# Patient Record
Sex: Female | Born: 1985 | Race: Black or African American | Hispanic: No | Marital: Single | Smoking: Current every day smoker
Health system: Southern US, Community
[De-identification: ages and names within clinical notes are randomized; demographics above are authoritative.]

## PROBLEM LIST (undated history)

## (undated) ENCOUNTER — Inpatient Hospital Stay (HOSPITAL_COMMUNITY): Payer: Self-pay

## (undated) DIAGNOSIS — I10 Essential (primary) hypertension: Secondary | ICD-10-CM

## (undated) DIAGNOSIS — R87619 Unspecified abnormal cytological findings in specimens from cervix uteri: Secondary | ICD-10-CM

## (undated) DIAGNOSIS — IMO0002 Reserved for concepts with insufficient information to code with codable children: Secondary | ICD-10-CM

## (undated) DIAGNOSIS — Z8759 Personal history of other complications of pregnancy, childbirth and the puerperium: Secondary | ICD-10-CM

## (undated) DIAGNOSIS — J45909 Unspecified asthma, uncomplicated: Secondary | ICD-10-CM

## (undated) DIAGNOSIS — R0602 Shortness of breath: Secondary | ICD-10-CM

## (undated) HISTORY — DX: Essential (primary) hypertension: I10

## (undated) HISTORY — PX: APPENDECTOMY: SHX54

## (undated) HISTORY — DX: Personal history of other complications of pregnancy, childbirth and the puerperium: Z87.59

---

## 2006-10-13 ENCOUNTER — Emergency Department (HOSPITAL_COMMUNITY): Admission: EM | Admit: 2006-10-13 | Discharge: 2006-10-13 | Payer: Self-pay | Admitting: Emergency Medicine

## 2006-11-26 ENCOUNTER — Emergency Department (HOSPITAL_COMMUNITY): Admission: EM | Admit: 2006-11-26 | Discharge: 2006-11-26 | Payer: Self-pay | Admitting: Emergency Medicine

## 2007-06-11 ENCOUNTER — Emergency Department (HOSPITAL_COMMUNITY): Admission: EM | Admit: 2007-06-11 | Discharge: 2007-06-11 | Payer: Self-pay | Admitting: Family Medicine

## 2007-07-12 ENCOUNTER — Emergency Department (HOSPITAL_COMMUNITY): Admission: EM | Admit: 2007-07-12 | Discharge: 2007-07-12 | Payer: Self-pay | Admitting: Emergency Medicine

## 2009-02-17 ENCOUNTER — Emergency Department (HOSPITAL_COMMUNITY): Admission: EM | Admit: 2009-02-17 | Discharge: 2009-02-17 | Payer: Self-pay | Admitting: Family Medicine

## 2009-10-24 ENCOUNTER — Emergency Department (HOSPITAL_COMMUNITY): Admission: EM | Admit: 2009-10-24 | Discharge: 2009-10-24 | Payer: Self-pay | Admitting: Emergency Medicine

## 2010-06-13 ENCOUNTER — Inpatient Hospital Stay (INDEPENDENT_AMBULATORY_CARE_PROVIDER_SITE_OTHER)
Admission: RE | Admit: 2010-06-13 | Discharge: 2010-06-13 | Disposition: A | Discharge status: Home / Self Care | Payer: BC Managed Care – PPO | Source: Ambulatory Visit | Attending: Family Medicine | Admitting: Family Medicine

## 2010-06-13 DIAGNOSIS — B9789 Other viral agents as the cause of diseases classified elsewhere: Secondary | ICD-10-CM

## 2010-06-13 LAB — POCT RAPID STREP A (OFFICE): Streptococcus, Group A Screen (Direct): NEGATIVE

## 2010-11-13 LAB — DIFFERENTIAL
Basophils Absolute: 0
Basophils Relative: 0
Eosinophils Absolute: 0
Eosinophils Relative: 0
Lymphocytes Relative: 13
Lymphs Abs: 1.2
Monocytes Absolute: 0.4
Monocytes Relative: 5
Neutro Abs: 7.4
Neutrophils Relative %: 82 — ABNORMAL HIGH

## 2010-11-13 LAB — CBC
HCT: 37.5
Hemoglobin: 12.8
MCHC: 34.1
MCV: 88.7
Platelets: 285
RBC: 4.23
RDW: 13.5
WBC: 9

## 2011-03-22 ENCOUNTER — Encounter (HOSPITAL_COMMUNITY): Payer: Self-pay

## 2011-03-22 ENCOUNTER — Emergency Department (HOSPITAL_COMMUNITY)
Admission: EM | Admit: 2011-03-22 | Discharge: 2011-03-22 | Disposition: A | Discharge status: Home / Self Care | Payer: BC Managed Care – PPO | Source: Home / Self Care | Attending: Family Medicine | Admitting: Family Medicine

## 2011-03-22 DIAGNOSIS — G44209 Tension-type headache, unspecified, not intractable: Secondary | ICD-10-CM

## 2011-03-22 NOTE — Discharge Instructions (Signed)
This is likely a tension headache. There are many causes for tension headaches. These range from holding the head in a certain position for prolonged periods of time to stress. This is usually treated with anti-inflammatory medications, such as naproxen or ibuprofen. Recommend taking 1-2 naproxen tablets every 12 hours for the next 7-10 days. A commercial brand of this product is also known as Aleve. you may also try mild heat to the area. And stretching exercises as well. Return to care should your symptoms not improve or worsen in any way such as numbness, tingling, or weakness or changes in vision.

## 2011-03-22 NOTE — ED Provider Notes (Signed)
History     CSN: [id number]  Arrival date & time 03/22/11  1657   First MD Initiated Contact with Patient 03/22/11 1820      Chief Complaint  Patient presents with  . 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 She denies any previous hx of headaches or migraines. Patient is a 26 y.o. female presenting with headaches. The history is provided by the patient.  Headache The primary symptoms include headaches. Primary symptoms do not include syncope, dizziness, focal weakness, fever, nausea or vomiting. The symptoms began more than 1 week ago. The symptoms are unchanged.  The headache began more than 2 days ago. Headache is a new problem. The headache is present intermittently. Location/region(s) of the headache: frontal. The headache is associated with nothing. The headache is associated with neck stiffness. The headache is not associated with weakness.  Additional symptoms include neck stiffness. Additional symptoms do not include weakness.    History reviewed. No pertinent past medical history.  Past Surgical History  Procedure Date  . Appendectomy     History reviewed. No pertinent family history.  History  Substance Use Topics  . Smoking status: Never Smoker   . Smokeless tobacco: Not on file  . Alcohol Use: No    OB History    Grav Para Term Preterm Abortions TAB SAB Ect Mult Living                  Review of Systems  Constitutional: Negative.  Negative for fever.  HENT: Positive for neck pain and neck stiffness.   Eyes: Negative.   Respiratory: Negative.   Cardiovascular: Negative.  Negative for syncope.  Gastrointestinal: Negative.   Negative for nausea and vomiting.  Genitourinary: Negative.   Skin: Negative.   Neurological: Positive for headaches. Negative for dizziness, focal weakness, weakness, light-headedness and numbness.    Allergies  Review of patient's allergies indicates no known allergies.  Home Medications   Current Outpatient Rx  Name Route Sig Dispense Refill  . OMEGA-3 FATTY ACIDS 1000 MG PO CAPS Oral Take 2 g by mouth daily.    . ORLISTAT 60 MG PO CAPS Oral Take 60 mg by mouth 3 (three) times daily with meals.      BP 126/75  Pulse 65  Temp(Src) 98.9 F (37.2 C) (Oral)  Resp 18  SpO2 100%  LMP 03/17/2011  Physical Exam  Nursing note and vitals reviewed. Constitutional: She is oriented to person, place, and time. She appears well-developed and well-nourished.  HENT:  Head: Normocephalic and atraumatic.  Eyes: EOM are normal.  Neck: Normal range of motion and full passive range of motion without pain. Muscular tenderness present. No spinous process tenderness present. No rigidity. Normal range of motion present.  Pulmonary/Chest: Effort normal.  Musculoskeletal: Normal range of motion.  Neurological: She is alert and oriented to person, place, and time.  Skin: Skin is warm and dry.  Psychiatric: Her behavior is normal.    ED Course  Procedures (including critical care time)  Labs Reviewed - No data to display No results found.   1. Tension headache       MDM  Advised over the counter NSAIDs such as naproxen or ibuprofen, with mild heat; return precautions given  such as numbness, tingling, or weakness        Richardo Priest, MD 03/22/11 2018

## 2011-03-22 NOTE — ED Notes (Signed)
Pt has had headache off and on for three weeks, states it starts on rt side of head and back of neck.

## 2012-06-10 ENCOUNTER — Inpatient Hospital Stay (HOSPITAL_COMMUNITY)
Admission: AD | Admit: 2012-06-10 | Discharge: 2012-06-10 | Disposition: A | Discharge status: Home / Self Care | Payer: Medicaid Other | Source: Ambulatory Visit | Attending: Obstetrics & Gynecology | Admitting: Obstetrics & Gynecology

## 2012-06-10 ENCOUNTER — Encounter (HOSPITAL_COMMUNITY): Payer: Self-pay | Admitting: *Deleted

## 2012-06-10 DIAGNOSIS — O99891 Other specified diseases and conditions complicating pregnancy: Secondary | ICD-10-CM | POA: Insufficient documentation

## 2012-06-10 DIAGNOSIS — Z3201 Encounter for pregnancy test, result positive: Secondary | ICD-10-CM

## 2012-06-10 DIAGNOSIS — Z3490 Encounter for supervision of normal pregnancy, unspecified, unspecified trimester: Secondary | ICD-10-CM

## 2012-06-10 LAB — POCT PREGNANCY, URINE: Preg Test, Ur: POSITIVE — AB

## 2012-06-10 LAB — HCG, QUANTITATIVE, PREGNANCY: hCG, Beta Chain, Quant, S: 5218 m[IU]/mL — ABNORMAL HIGH (ref <5)

## 2012-06-10 NOTE — MAU Provider Note (Signed)
Attestation of Attending Supervision of Advanced Practitioner: Evaluation and management procedures were performed by the PA/NP/CNM/OB Fellow under my supervision/collaboration. Chart reviewed and agree with management and plan.  Nobie Alleyne V 06/10/2012 10:15 PM

## 2012-06-10 NOTE — MAU Provider Note (Signed)
None     Chief Complaint:  Seen at pregnancy care center this afternoon, and nurse performed abdominal u/s and could not see the uterus.  (they do not have a vaginal probe).  No pain/bleeding. Pt cannot stay any longer.    [name] is  27 y.o. No obstetric history on file.  Obstetrical/Gynecological History: Menstrual History: OB History   Grav Para Term Preterm Abortions TAB SAB Ect Mult Living                  Patient's last menstrual period was 04/16/2012.     Past Medical History: No past medical history on file.  Past Surgical History: Past Surgical History  Procedure Laterality Date  . Appendectomy      Family History: No family history on file.  Social History: History  Substance Use Topics  . Smoking status: Never Smoker   . Smokeless tobacco: Not on file  . Alcohol Use: No    Allergies: No Known Allergies  Meds:  Prescriptions prior to admission  Medication Sig Dispense Refill  . fish oil-omega-3 fatty acids 1000 MG capsule Take 2 g by mouth daily.      Marland Kitchen orlistat (ALLI) 60 MG capsule Take 60 mg by mouth 3 (three) times daily with meals.        Review of Systems   Constitutional: Negative for fever and chills Eyes: Negative for visual disturbances Respiratory: Negative for shortness of breath, dyspnea Cardiovascular: Negative for chest pain or palpitations  Gastrointestinal: Negative for vomiting, diarrhea and constipation Genitourinary: Negative for dysuria and urgency Musculoskeletal: Negative for back pain, joint pain, myalgias  Neurological: Negative for dizziness and headaches    .   Labs: UPT barely + Imaging Studies:  Assessment: Victoria Robinson is  27 y.o. with probable early pregnancy.  After her Qhcg was drawn, pt had to leave.    Plan Pt will call back for Qhcg results and agrees to come back to MAU if any pain/bleeding occurs.  Pt already has plans to start [facility] with her  OB/GYN  CRESENZO-DISHMAN,Devonda Pequignot 5/8/20146:23 PM

## 2012-06-10 NOTE — MAU Note (Signed)
SPOKE WITH FRAN, CNM- SAID DREW LABS AND  TALKED TO PT AND D/C PT.

## 2012-06-10 NOTE — MAU Note (Signed)
Went to the pregnancy center for an [place], she couldn't see the baby- so she recommended that she come over here.

## 2012-06-11 ENCOUNTER — Ambulatory Visit (HOSPITAL_COMMUNITY): Payer: Self-pay

## 2012-06-11 ENCOUNTER — Other Ambulatory Visit: Payer: Self-pay | Admitting: Advanced Practice Midwife

## 2012-06-11 DIAGNOSIS — Z3490 Encounter for supervision of normal pregnancy, unspecified, unspecified trimester: Secondary | ICD-10-CM

## 2012-06-22 ENCOUNTER — Ambulatory Visit (HOSPITAL_COMMUNITY): Admission: RE | Admit: 2012-06-22 | Payer: Self-pay | Source: Ambulatory Visit

## 2012-08-18 ENCOUNTER — Encounter (HOSPITAL_COMMUNITY): Payer: Self-pay | Admitting: Emergency Medicine

## 2012-08-18 ENCOUNTER — Emergency Department (HOSPITAL_COMMUNITY)
Admission: EM | Admit: 2012-08-18 | Discharge: 2012-08-18 | Disposition: A | Discharge status: Home / Self Care | Payer: Medicaid Other | Attending: Emergency Medicine | Admitting: Emergency Medicine

## 2012-08-18 DIAGNOSIS — Y9389 Activity, other specified: Secondary | ICD-10-CM | POA: Insufficient documentation

## 2012-08-18 DIAGNOSIS — O9989 Other specified diseases and conditions complicating pregnancy, childbirth and the puerperium: Secondary | ICD-10-CM | POA: Insufficient documentation

## 2012-08-18 DIAGNOSIS — I10 Essential (primary) hypertension: Secondary | ICD-10-CM | POA: Insufficient documentation

## 2012-08-18 DIAGNOSIS — S060X0A Concussion without loss of consciousness, initial encounter: Secondary | ICD-10-CM | POA: Insufficient documentation

## 2012-08-18 DIAGNOSIS — F411 Generalized anxiety disorder: Secondary | ICD-10-CM | POA: Insufficient documentation

## 2012-08-18 DIAGNOSIS — W1809XA Striking against other object with subsequent fall, initial encounter: Secondary | ICD-10-CM | POA: Insufficient documentation

## 2012-08-18 DIAGNOSIS — Y92009 Unspecified place in unspecified non-institutional (private) residence as the place of occurrence of the external cause: Secondary | ICD-10-CM | POA: Insufficient documentation

## 2012-08-18 MED ORDER — ACETAMINOPHEN 325 MG PO TABS
650.0000 mg | ORAL_TABLET | Freq: Once | ORAL | Status: AC
  Administered 2012-08-18: 650 mg via ORAL
  Filled 2012-08-18: qty 2

## 2012-08-18 NOTE — ED Notes (Addendum)
PT. SLIPPED AND FELL ON WET FLOOR YESTERDAY AT HOME , NO LOC/ AMBULATORY , REPORTS PAIN AT BACK OF HEAD , STIFFNECK AND LEFT EYE PRESSURE. ALERT AND ORIENTED. RESPIRATIONS UNLABORED. PT. STATED SHE IS [redacted] WEEKS PREGNANT G3P1

## 2012-08-18 NOTE — ED Provider Notes (Signed)
   History    CSN: [id number] Arrival date & time 08/18/12  0151  First MD Initiated Contact with Patient 08/18/12 0231     Chief Complaint  Patient presents with  . Fall   (Consider location/radiation/quality/duration/timing/severity/associated sxs/prior Treatment) HPI Patient is a 27 yo F with 14wk pregnancy per LMP. She  presents with complaints of persistent but very mild right retroorbital headache following head trauma approx 36h ago. She slipped on kitchen floor, fell backward and hit head on floor. Denies LOC. Notes nausea yesterday but, denies vomiting. Patient says she was at work on 2nd shift and felt anxious so she decided to come to the ED and get checkedout. She is pain free at this time. She denies any sense of confusion or cognitive changes, Feels like her right eyelid is twitching.   Patient is noted to be mildly hypertensive in triage and denies history of HTN.   History reviewed. No pertinent past medical history. Past Surgical History  Procedure Laterality Date  . Appendectomy     No family history on file. History  Substance Use Topics  . Smoking status: Never Smoker   . Smokeless tobacco: Not on file  . Alcohol Use: No   OB History   Grav Para Term Preterm Abortions TAB SAB Ect Mult Living   1              Review of Systems  Gen: no weight loss, fevers, chills, night sweats Eyes: no discharge or drainage, no occular pain or visual changes Nose: no epistaxis or rhinorrhea Mouth: no dental pain, no sore throat Neck: no neck pain Lungs: no SOB, cough, wheezing CV: no chest pain, palpitations, dependent edema or orthopnea Abd: no abdominal pain, nausea, vomiting OB-no vaginal bleeding, no pelvic pain. GU: no dysuria or gross hematuria MSK: no myalgias or arthralgias Neuro: As per history of present illness, otherwise negative Skin: no rash Psyche: negative.  Allergies  Review of patient's allergies indicates no known allergies.  Home Medications    Current Outpatient Rx  Name  Route  Sig  Dispense  Refill  . fish oil-omega-3 fatty acids 1000 MG capsule   Oral   Take 2 g by mouth daily.          BP 144/92  Pulse 82  Temp(Src) 98.1 F (36.7 C) (Oral)  Resp 20  SpO2 98%  LMP 04/16/2012 Physical Exam Gen: well developed and well nourished appearing Head: NCAT,a no hematoma, abrasion or lacerations appreciated. Eyes: PERL, EOMI Nose: no epistaixis or rhinorrhea Mouth/throat: mucosa is moist and pink Neck: supple, no stridor, no C-spine tenderness Lungs: CTA B, no wheezing, rhonchi or rales Heart-regular rate and rhythm, no murmur Abd: soft, notender, nondistended Back: no ttp, no cva ttp Skin: no rashese, wnl Extremities-nontender, no edema Neuro: CN ii-xii grossly intact, no focal deficits Psyche; normal affect,  calm and cooperative.   ED Course  Procedures (including critical care time)   MDM  Patient with mild concussion. Counseled re: head injury instructions.  Patient notified of incidental finding of HTN. She has not established OB care and I have referred her to [facility]. Patient asked to call tomorrow for first available f/u.   Brandt Loosen, MD 08/18/12 [phone]

## 2012-08-18 NOTE — ED Notes (Signed)
PT comfortable with d/c and f/u instructions. No prescriptions. 

## 2012-08-28 ENCOUNTER — Inpatient Hospital Stay (HOSPITAL_COMMUNITY)
Admission: AD | Admit: 2012-08-28 | Discharge: 2012-08-28 | Disposition: A | Discharge status: Home / Self Care | Payer: Medicaid Other | Source: Ambulatory Visit | Attending: Obstetrics | Admitting: Obstetrics

## 2012-08-28 ENCOUNTER — Encounter (HOSPITAL_COMMUNITY): Payer: Self-pay | Admitting: Family

## 2012-08-28 DIAGNOSIS — R109 Unspecified abdominal pain: Secondary | ICD-10-CM | POA: Insufficient documentation

## 2012-08-28 DIAGNOSIS — O99891 Other specified diseases and conditions complicating pregnancy: Secondary | ICD-10-CM | POA: Insufficient documentation

## 2012-08-28 DIAGNOSIS — N949 Unspecified condition associated with female genital organs and menstrual cycle: Secondary | ICD-10-CM

## 2012-08-28 HISTORY — DX: Shortness of breath: R06.02

## 2012-08-28 LAB — URINALYSIS, ROUTINE W REFLEX MICROSCOPIC
Hgb urine dipstick: NEGATIVE
Ketones, ur: NEGATIVE mg/dL
Leukocytes, UA: NEGATIVE

## 2012-08-28 MED ORDER — ABDOMINAL BINDER/ELASTIC LARGE MISC: 1.0000 [IU] | Freq: Every day | Status: DC | PRN

## 2012-08-28 MED ORDER — CYCLOBENZAPRINE HCL 10 MG PO TABS: 10.0000 mg | ORAL_TABLET | Freq: Two times a day (BID) | ORAL | Status: DC | PRN

## 2012-08-28 MED ORDER — ACETAMINOPHEN 500 MG PO TABS
1000.0000 mg | ORAL_TABLET | Freq: Once | ORAL | Status: AC
  Administered 2012-08-28: 1000 mg via ORAL
  Filled 2012-08-28: qty 2

## 2012-08-28 MED ORDER — CYCLOBENZAPRINE HCL 10 MG PO TABS
10.0000 mg | ORAL_TABLET | Freq: Once | ORAL | Status: AC
  Administered 2012-08-28: 10 mg via ORAL
  Filled 2012-08-28: qty 1

## 2012-08-28 NOTE — MAU Provider Note (Signed)
History     CSN: [id number]  Arrival date and time: 08/28/12 1348   First Provider Initiated Contact with Patient 08/28/12 1431      Chief Complaint  Patient presents with  . Abdominal Pain   HPI Ms. Victoria Robinson is a 27 y.o. G3P1011 at [redacted]w[redacted]d who presents to MAU today with complaint of lower abdominal pain more prominent on the right side. The patient states that the pain has been present x 3-4 weeks, but recently has been getting worse. Pain is present mostly with change or positions and ambulation. She has also noted a "knot" in her right inguinal region recently that is tender. She has had some constipation, last BM was yesterday. She denies N/V/D, fever, UTI symptoms, vaginal bleeding or abnormal discharge. Patient rates pain at 9-10/10  OB History   Grav Para Term Preterm Abortions TAB SAB Ect Mult Living   3 1 1  1  1   1       Past Medical History  Diagnosis Date  . Shortness of breath     Past Surgical History  Procedure Laterality Date  . Appendectomy      No family history on file.  History  Substance Use Topics  . Smoking status: Never Smoker   . Smokeless tobacco: Not on file  . Alcohol Use: No    Allergies: No Known Allergies  Prescriptions prior to admission  Medication Sig Dispense Refill  . acetaminophen (TYLENOL) 500 MG tablet Take 1,000 mg by mouth every 6 (six) hours as needed for pain.      . Prenatal Vit-Fe Fumarate-FA (PRENATAL MULTIVITAMIN) TABS Take 1 tablet by mouth daily.        Review of Systems  Constitutional: Negative for fever and malaise/fatigue.  Gastrointestinal: Positive for abdominal pain and constipation. Negative for nausea, vomiting and diarrhea.  Genitourinary: Negative for dysuria, urgency and frequency.       Neg - vaginal bleeding, discharge   Physical Exam   Blood pressure 129/67, pulse 95, temperature 98.6 F (37 C), temperature source Oral, resp. rate 20, height 5\' 2"  (1.575 m), weight 238 lb (107.956 kg),  last menstrual period 04/16/2012.  Physical Exam  Constitutional: She is oriented to person, place, and time. She appears well-developed and well-nourished. No distress.  HENT:  Head: Normocephalic and atraumatic.  Cardiovascular: Normal rate and normal heart sounds.   Respiratory: Effort normal and breath sounds normal. No respiratory distress.  GI: Soft. Bowel sounds are normal. She exhibits no distension and no mass. There is tenderness (mild tenderness to palpation of the RLQ). There is no rebound and no guarding.  Patient examined in standing position as well, no masses noted in the right inguinal region  Neurological: She is alert and oriented to person, place, and time.  Skin: Skin is warm and dry. No erythema.  Psychiatric: She has a normal mood and affect.   Results for orders placed during the hospital encounter of 08/28/12 (from the past 24 hour(s))  URINALYSIS, ROUTINE W REFLEX MICROSCOPIC     Status: Abnormal   Collection Time    08/28/12  2:08 PM      Result Value Range   Color, Urine YELLOW  YELLOW   APPearance HAZY (*) CLEAR   Specific Gravity, Urine 1.020  1.005 - 1.030   pH 7.5  5.0 - 8.0   Glucose, UA NEGATIVE  NEGATIVE mg/dL   Hgb urine dipstick NEGATIVE  NEGATIVE   Bilirubin Urine NEGATIVE  NEGATIVE  Ketones, ur NEGATIVE  NEGATIVE mg/dL   Protein, ur NEGATIVE  NEGATIVE mg/dL   Urobilinogen, UA 0.2  0.0 - 1.0 mg/dL   Nitrite NEGATIVE  NEGATIVE   Leukocytes, UA NEGATIVE  NEGATIVE    MAU Course  Procedures None  MDM UA today +FHTs Flexeril and Tylenol given in MAU - patient rates pain at 4/10 at time of discharge  Assessment and Plan  A: Round ligament pain  P: Discharge home Rx for Flexeril and abdominal binder sent to patient's pharmacy Patient advised to take Tylenol PRN pain and use warm bath/shower for relief Patient encouraged to keep appointment to start prenatal care with Dr. Clearance Coots next week Patient may return to MAU as needed or if her  condition were to change or worsen  Freddi Starr, PA-C  08/28/2012, 2:32 PM

## 2012-08-28 NOTE — MAU Note (Signed)
Patient presents to MAU with LLQ pain radiating into lower back, worsening over 3-4 weeks. Denies VB.  Patient has first prenatal appt with Dr. Gaynell Face Monday at 1030.

## 2012-08-28 NOTE — MAU Note (Addendum)
Pt had miscarriage, got preg right away, did not have a cycle in between.  Has not been seen yet. Has appt on Monday.

## 2012-08-28 NOTE — MAU Note (Addendum)
Pain in lower abd esp on Rt side, pain is not constant. Sharp pains worse when standing or walking, can't take it any more.  Can feel a knot or something about the size of a walnut in that lower rt side.

## 2012-08-29 NOTE — MAU Provider Note (Signed)
Attestation of Attending Supervision of Advanced Practitioner (PA/CNM/NP): Evaluation and management procedures were performed by the Advanced Practitioner under my supervision and collaboration.  I have reviewed the Advanced Practitioner's note and chart, and I agree with the management and plan.  Yerik Zeringue, MD, FACOG Attending Obstetrician & Gynecologist Faculty Practice, [facility]  

## 2012-08-30 ENCOUNTER — Ambulatory Visit (INDEPENDENT_AMBULATORY_CARE_PROVIDER_SITE_OTHER): Payer: Medicaid Other | Admitting: Obstetrics

## 2012-08-30 ENCOUNTER — Encounter: Payer: Self-pay | Admitting: Obstetrics

## 2012-08-30 VITALS — BP 118/71 | Temp 98.1°F | Wt 220.0 lb

## 2012-08-30 DIAGNOSIS — Z3480 Encounter for supervision of other normal pregnancy, unspecified trimester: Secondary | ICD-10-CM

## 2012-08-30 DIAGNOSIS — Z3482 Encounter for supervision of other normal pregnancy, second trimester: Secondary | ICD-10-CM

## 2012-08-30 DIAGNOSIS — Z3483 Encounter for supervision of other normal pregnancy, third trimester: Secondary | ICD-10-CM

## 2012-08-30 DIAGNOSIS — Z113 Encounter for screening for infections with a predominantly sexual mode of transmission: Secondary | ICD-10-CM

## 2012-08-30 LAB — POCT URINALYSIS DIPSTICK
Glucose, UA: NEGATIVE
Ketones, UA: NEGATIVE
Nitrite, UA: NEGATIVE
Urobilinogen, UA: NEGATIVE
pH, UA: 5

## 2012-08-30 NOTE — Progress Notes (Signed)
P-78  Subjective:    Victoria Robinson is being seen today for her first obstetrical visit.  This is not a planned pregnancy. She is at [redacted]w[redacted]d gestation. Her obstetrical history is significant for smoker. Relationship with FOB: significant other, living together. Patient does intend to breast feed. Pregnancy history fully reviewed.  Menstrual History: OB History   Grav Para Term Preterm Abortions TAB SAB Ect Mult Living   3 1 1  1  1   1       Last Pap: 2013 Abnomral Menarche age: 8 Regular  Patient's last menstrual period was 04/16/2012.    The following portions of the patient's history were reviewed and updated as appropriate: allergies, current medications, past family history, past medical history, past social history, past surgical history and problem list.  Review of Systems Pertinent items are noted in HPI.    Objective:    General appearance: alert and no distress Abdomen: normal findings: soft, non-tender Pelvic: cervix normal in appearance, external genitalia normal, no adnexal masses or tenderness, no cervical motion tenderness, uterus normal size, shape, and consistency and vagina normal without discharge    Assessment:    Pregnancy at [redacted]w[redacted]d weeks    Plan:    Initial labs drawn. Prenatal vitamins.  Counseling provided regarding continued use of seat belts, cessation of alcohol consumption, smoking or use of illicit drugs; infection precautions i.e., influenza/TDAP immunizations, toxoplasmosis,CMV, parvovirus, listeria and varicella; workplace safety, exercise during pregnancy; routine dental care, safe medications, sexual activity, hot tubs, saunas, pools, travel, caffeine use, fish and methlymercury, potential toxins, hair treatments, varicose veins Weight gain recommendations reviewed: underweight/BMI< 18.5--> gain 28 - 40 lbs; normal weight/BMI 18.5 - 24.9--> gain 25 - 35 lbs; overweight/BMI 25 - 29.9--> gain 15 - 25 lbs; obese/BMI >30->gain  11 - 20 lbs Problem  list reviewed and updated. AFP3 discussed: requested. Role of ultrasound in pregnancy discussed; fetal survey: requested. Amniocentesis discussed: not indicated. Follow up in 1 weeks. 50% of 20 min visit spent on counseling and coordination of care.

## 2012-08-31 LAB — OBSTETRIC PANEL
Eosinophils Absolute: 0.1 10*3/uL (ref 0.0–0.7)
Eosinophils Relative: 1 % (ref 0–5)
Hepatitis B Surface Ag: NEGATIVE
Lymphocytes Relative: 26 % (ref 12–46)
Lymphs Abs: 2 10*3/uL (ref 0.7–4.0)
MCHC: 33.3 g/dL (ref 30.0–36.0)
Monocytes Absolute: 0.5 10*3/uL (ref 0.1–1.0)
Monocytes Relative: 7 % (ref 3–12)
Neutro Abs: 4.9 10*3/uL (ref 1.7–7.7)
RBC: 4 MIL/uL (ref 3.87–5.11)
Rh Type: POSITIVE
Rubella: 3.56 Index — ABNORMAL HIGH (ref <0.90)
WBC: 7.5 10*3/uL (ref 4.0–10.5)

## 2012-08-31 LAB — VARICELLA ZOSTER ANTIBODY, IGG: Varicella IgG: 2457 Index — ABNORMAL HIGH (ref <135.00)

## 2012-08-31 LAB — CULTURE, OB URINE: Organism ID, Bacteria: NO GROWTH

## 2012-08-31 LAB — WET PREP BY MOLECULAR PROBE
Candida species: NEGATIVE
Gardnerella vaginalis: POSITIVE — AB

## 2012-08-31 LAB — PAP IG W/ RFLX HPV ASCU

## 2012-08-31 LAB — VITAMIN D 25 HYDROXY (VIT D DEFICIENCY, FRACTURES): Vit D, 25-Hydroxy: 23 ng/mL — ABNORMAL LOW (ref 30–89)

## 2012-08-31 LAB — GC/CHLAMYDIA PROBE AMP: GC Probe RNA: NEGATIVE

## 2012-09-01 ENCOUNTER — Ambulatory Visit (INDEPENDENT_AMBULATORY_CARE_PROVIDER_SITE_OTHER): Payer: Medicaid Other

## 2012-09-01 DIAGNOSIS — Z3400 Encounter for supervision of normal first pregnancy, unspecified trimester: Secondary | ICD-10-CM

## 2012-09-01 DIAGNOSIS — Z3482 Encounter for supervision of other normal pregnancy, second trimester: Secondary | ICD-10-CM

## 2012-09-01 LAB — AFP, QUAD SCREEN
Down Syndrome Scr Risk Est: 1:1840 {titer}
INH: 104.8 pg/mL
Interpretation-AFP: NEGATIVE
MoM for AFP: 0.7
MoM for INH: 0.7
MoM for hCG: 1.5
Open Spina bifida: NEGATIVE
Osb Risk: <1:54600 {titer}
Tri 18 Scr Risk Est: NEGATIVE

## 2012-09-01 LAB — HEMOGLOBINOPATHY EVALUATION: Hgb A2 Quant: 2.9 % (ref 2.2–3.2)

## 2012-09-01 LAB — HUMAN PAPILLOMAVIRUS, HIGH RISK: HPV DNA High Risk: DETECTED — AB

## 2012-09-02 ENCOUNTER — Encounter: Payer: Self-pay | Admitting: Obstetrics & Gynecology

## 2012-09-13 ENCOUNTER — Other Ambulatory Visit: Payer: Self-pay | Admitting: Obstetrics

## 2012-09-13 DIAGNOSIS — Z0489 Encounter for examination and observation for other specified reasons: Secondary | ICD-10-CM

## 2012-09-14 ENCOUNTER — Other Ambulatory Visit: Payer: Self-pay | Admitting: *Deleted

## 2012-09-14 ENCOUNTER — Encounter: Payer: Self-pay | Admitting: *Deleted

## 2012-09-14 DIAGNOSIS — N760 Acute vaginitis: Secondary | ICD-10-CM

## 2012-09-14 MED ORDER — METRONIDAZOLE 500 MG PO TABS: 500.0000 mg | ORAL_TABLET | Freq: Two times a day (BID) | ORAL | Status: DC

## 2012-10-05 ENCOUNTER — Ambulatory Visit (HOSPITAL_COMMUNITY)
Admission: RE | Admit: 2012-10-05 | Discharge: 2012-10-05 | Disposition: A | Discharge status: Home / Self Care | Payer: Medicaid Other | Source: Ambulatory Visit | Attending: Obstetrics | Admitting: Obstetrics

## 2012-10-05 ENCOUNTER — Encounter: Payer: Self-pay | Admitting: Obstetrics

## 2012-10-05 ENCOUNTER — Other Ambulatory Visit: Payer: Self-pay | Admitting: Obstetrics

## 2012-10-05 DIAGNOSIS — Z363 Encounter for antenatal screening for malformations: Secondary | ICD-10-CM | POA: Insufficient documentation

## 2012-10-05 DIAGNOSIS — Z0489 Encounter for examination and observation for other specified reasons: Secondary | ICD-10-CM

## 2012-10-05 DIAGNOSIS — O99330 Smoking (tobacco) complicating pregnancy, unspecified trimester: Secondary | ICD-10-CM | POA: Insufficient documentation

## 2012-10-05 DIAGNOSIS — Z1389 Encounter for screening for other disorder: Secondary | ICD-10-CM | POA: Insufficient documentation

## 2012-10-05 DIAGNOSIS — O358XX0 Maternal care for other (suspected) fetal abnormality and damage, not applicable or unspecified: Secondary | ICD-10-CM | POA: Insufficient documentation

## 2012-10-05 DIAGNOSIS — E669 Obesity, unspecified: Secondary | ICD-10-CM | POA: Insufficient documentation

## 2012-10-05 LAB — US OB DETAIL + 14 WK

## 2012-10-05 NOTE — Progress Notes (Signed)
Victoria Robinson  was seen today for an ultrasound appointment.  See full report in [facility].  Impression: Single IUP at 22 1/7 weeks Normal fetal anatomic survey Somewhat limited views of the fetal heart obatined No markers associated with aneuploidy noted Normal amniotic fluid volume  Marginal cord insertion noted  Recommendations: Recommend follow-up ultrasound examination in 6 weeks for interval growth and to reevaluate the fetal heart  Alpha Gula, MD

## 2012-10-07 ENCOUNTER — Other Ambulatory Visit: Payer: Medicaid Other

## 2012-10-07 ENCOUNTER — Encounter: Payer: Self-pay | Admitting: Obstetrics

## 2012-10-07 ENCOUNTER — Encounter: Payer: Self-pay | Admitting: *Deleted

## 2012-10-14 ENCOUNTER — Other Ambulatory Visit: Payer: Medicaid Other

## 2012-10-14 ENCOUNTER — Encounter: Payer: Medicaid Other | Admitting: Obstetrics

## 2012-10-15 ENCOUNTER — Ambulatory Visit (INDEPENDENT_AMBULATORY_CARE_PROVIDER_SITE_OTHER): Payer: Medicaid Other | Admitting: Advanced Practice Midwife

## 2012-10-15 ENCOUNTER — Other Ambulatory Visit: Payer: Self-pay

## 2012-10-15 ENCOUNTER — Encounter: Payer: Self-pay | Admitting: Advanced Practice Midwife

## 2012-10-15 VITALS — BP 118/78 | Temp 98.0°F | Wt 232.0 lb

## 2012-10-15 DIAGNOSIS — Z3482 Encounter for supervision of other normal pregnancy, second trimester: Secondary | ICD-10-CM

## 2012-10-15 DIAGNOSIS — Z3480 Encounter for supervision of other normal pregnancy, unspecified trimester: Secondary | ICD-10-CM

## 2012-10-15 LAB — CBC
Hemoglobin: 12 g/dL (ref 12.0–15.0)
MCH: 31.3 pg (ref 26.0–34.0)
Platelets: 240 10*3/uL (ref 150–400)
RBC: 3.84 MIL/uL — ABNORMAL LOW (ref 3.87–5.11)
WBC: 8.1 10*3/uL (ref 4.0–10.5)

## 2012-10-15 LAB — POCT URINALYSIS DIPSTICK
Blood, UA: NEGATIVE
Glucose, UA: NEGATIVE
Ketones, UA: NEGATIVE
Protein, UA: NEGATIVE
Spec Grav, UA: 1.02
Urobilinogen, UA: NEGATIVE

## 2012-10-15 NOTE — Progress Notes (Signed)
Pulse: 83

## 2012-10-15 NOTE — Progress Notes (Signed)
Subjective: Victoria Robinson is a 27 y.o. at 23 weeks by 17 week [place]  Patient denies vaginal leaking of fluid or bleeding, denies contractions.  Reports positive fetal movment.  Denies concerns today. Expecting a boy, plans to [facility]. BRF last child x3 months. Considering options for BCM PP. Interested in the [place].   Objective: Filed Vitals:   10/15/12 0958  BP: 118/78  Temp: 98 F (36.7 C)   150 FHR 24 Fundal Height Fetal Position unknown  Assessment: Patient Active Problem List   Diagnosis Date Noted  . Supervision of other normal pregnancy 10/15/2012    Plan: Patient to return to clinic in 3-4 weeks Reviewed warning signs in pregnancy. Patient to call with concerns PRN. Reviewed triage location. 2 hour GCT today Mirena and Nexplanon handouts given. Discuss due date NV, based on 17 week [place].  Cecile Gillispie Wilson Singer [facility]

## 2012-10-16 LAB — GLUCOSE TOLERANCE, 2 HOURS W/ 1HR
Glucose, 1 hour: 85 mg/dL (ref 70–170)
Glucose, Fasting: 66 mg/dL — ABNORMAL LOW (ref 70–99)

## 2012-10-16 LAB — RPR

## 2012-10-16 LAB — HIV ANTIBODY (ROUTINE TESTING W REFLEX): HIV: NONREACTIVE

## 2012-10-26 ENCOUNTER — Ambulatory Visit (INDEPENDENT_AMBULATORY_CARE_PROVIDER_SITE_OTHER): Payer: Medicaid Other | Admitting: Advanced Practice Midwife

## 2012-10-26 ENCOUNTER — Encounter: Payer: Self-pay | Admitting: Advanced Practice Midwife

## 2012-10-26 VITALS — BP 111/74 | Temp 98.1°F | Wt 244.0 lb

## 2012-10-26 DIAGNOSIS — Z3482 Encounter for supervision of other normal pregnancy, second trimester: Secondary | ICD-10-CM

## 2012-10-26 DIAGNOSIS — Z3480 Encounter for supervision of other normal pregnancy, unspecified trimester: Secondary | ICD-10-CM

## 2012-10-26 LAB — POCT URINALYSIS DIPSTICK
Glucose, UA: NEGATIVE
Leukocytes, UA: NEGATIVE
Nitrite, UA: NEGATIVE
Spec Grav, UA: 1.01
Urobilinogen, UA: NEGATIVE

## 2012-10-26 NOTE — Progress Notes (Signed)
Subjective: Victoria Robinson is a 27 y.o. at 26 weeks by 17 wk [place]  Patient denies vaginal leaking of fluid or bleeding, denies contractions.  Reports positive fetal movment.  Denies concerns today.  Objective: Filed Vitals:   10/26/12 1115  BP: 111/74  Temp: 98.1 F (36.7 C)   145 FHR 26 Fundal Height Fetal Position unknown  Assessment: Patient Active Problem List   Diagnosis Date Noted  . Supervision of other normal pregnancy 10/15/2012    Plan: Patient to return to clinic in 2 weeks Reviewed EDD Patient needs repeat US for repeat of sub optimal views of of heart and growth.  Reviewed warning signs in pregnancy. Patient to call with concerns PRN. Reviewed triage location.  Shinita Mac Wilson Singer CNM

## 2012-10-26 NOTE — Progress Notes (Signed)
Pulse: 88

## 2012-10-29 ENCOUNTER — Encounter: Payer: Self-pay | Admitting: *Deleted

## 2012-11-01 ENCOUNTER — Other Ambulatory Visit: Payer: Self-pay | Admitting: *Deleted

## 2012-11-01 DIAGNOSIS — Z0489 Encounter for examination and observation for other specified reasons: Secondary | ICD-10-CM

## 2012-11-09 ENCOUNTER — Encounter: Payer: Medicaid Other | Admitting: Advanced Practice Midwife

## 2012-11-13 NOTE — Progress Notes (Signed)
This encounter was created in error - please disregard.

## 2012-11-17 ENCOUNTER — Ambulatory Visit (HOSPITAL_COMMUNITY): Payer: Medicaid Other

## 2012-11-17 ENCOUNTER — Inpatient Hospital Stay (HOSPITAL_COMMUNITY)
Admission: AD | Admit: 2012-11-17 | Discharge: 2012-11-17 | Disposition: A | Discharge status: Home / Self Care | Payer: Medicaid Other | Source: Ambulatory Visit | Attending: Obstetrics & Gynecology | Admitting: Obstetrics & Gynecology

## 2012-11-17 ENCOUNTER — Encounter (HOSPITAL_COMMUNITY): Payer: Self-pay

## 2012-11-17 DIAGNOSIS — O368131 Decreased fetal movements, third trimester, fetus 1: Secondary | ICD-10-CM

## 2012-11-17 DIAGNOSIS — N930 Postcoital and contact bleeding: Secondary | ICD-10-CM

## 2012-11-17 DIAGNOSIS — O4690 Antepartum hemorrhage, unspecified, unspecified trimester: Secondary | ICD-10-CM | POA: Insufficient documentation

## 2012-11-17 DIAGNOSIS — O368190 Decreased fetal movements, unspecified trimester, not applicable or unspecified: Secondary | ICD-10-CM | POA: Insufficient documentation

## 2012-11-17 HISTORY — DX: Unspecified abnormal cytological findings in specimens from cervix uteri: R87.619

## 2012-11-17 HISTORY — DX: Reserved for concepts with insufficient information to code with codable children: IMO0002

## 2012-11-17 NOTE — MAU Provider Note (Signed)
Chief Complaint:  Decreased Fetal Movement and Vaginal Bleeding   First Provider Initiated Contact with Patient 11/17/12 2119      HPI: Victoria Robinson is a 27 y.o. G3P1011 at [redacted]w[redacted]d who presents to maternity admissions reporting no fetal movement since yesterday and seeing pink tinge to discharge with wiping after intercourse this morning. Denies fever, chills, contractions, leakage of fluid, vaginal discharge. Good fetal movement.   Anterior placenta, no previa per 22 week [place].   Past Medical History: Past Medical History  Diagnosis Date  . Shortness of breath   . Abnormal Pap smear     repeat WNL    Past obstetric history: OB History  Gravida Para Term Preterm AB SAB TAB Ectopic Multiple Living  3 1 1  1 1    1     # Outcome Date GA Lbr Len/2nd Weight Sex Delivery Anes PTL Lv  3 CUR           2 SAB 2014          1 TRM 2010 [redacted]w[redacted]d   M SVD EPI N Y      Past Surgical History: Past Surgical History  Procedure Laterality Date  . Appendectomy       Family History: Family History  Problem Relation Age of Onset  . Hyperlipidemia Mother   . Hyperlipidemia Father   . Hypertension Father   . Diabetes Maternal Grandmother   . Diabetes Paternal Grandmother     Social History: History  Substance Use Topics  . Smoking status: Former Smoker    Quit date: 09/17/2012  . Smokeless tobacco: Not on file  . Alcohol Use: No    Allergies: No Known Allergies  Meds:  No prescriptions prior to admission    ROS: Pertinent findings in history of present illness.  Physical Exam  Blood pressure 120/71, pulse 76, temperature 98.6 F (37 C), temperature source Oral, resp. rate 18, height 5\' 1"  (1.549 m), weight 113.853 kg (251 lb), last menstrual period 04/16/2012, SpO2 99.00%. GENERAL: Well-developed, well-nourished female in no acute distress.  HEENT: normocephalic HEART: normal rate RESP: normal effort ABDOMEN: Soft, non-tender, gravid appropriate for gestational  age EXTREMITIES: Nontender, no edema NEURO: alert and oriented SPECULUM EXAM: NEFG, physiologic discharge, no blood, cervix clean Dilation: Closed Effacement (%): Thick Cervical Position: [place]: Ballotable Presentation: Undeterminable Exam by:: Dorathy Kinsman, CNM   FHT:  Baseline 150 , moderate variability, accelerations present, no decelerations Contractions: none   MAU Course: Baby active during MAU visit.  Assessment: 1. Decreased fetal movement in pregnancy, antepartum, third trimester, fetus 1   2. PCB (post coital bleeding)    Plan: Discharge home in stable condition. Preterm labor precautions and fetal kick counts. Bleeding precautions. Pelvic rest x1 week.     Follow-up Information   Follow up with [facility] [place] [facility] On 11/18/2012. (as scheduled)    Specialty:  Radiology   Contact information:   [address] [id number] [place] [address] [phone]      Follow up with [facility]. (as scheduled or as needed if symptoms worsen)    Contact information:   [address] [place] [address] [phone]      Follow up with [facility] [place] MATERNITY ADMISSIONS. (As needed if symptoms worsen)    Contact information:   [address] [id number] [place] [address] [phone]       Medication List  acetaminophen 500 MG tablet  Commonly known as:  TYLENOL  Take 1,000 mg by mouth every 6 (six) hours as needed for pain.     cyclobenzaprine 10 MG tablet  Commonly known as:  FLEXERIL  Take 1 tablet (10 mg total) by mouth 2 (two) times daily as needed for muscle spasms.     prenatal multivitamin Tabs tablet  Take 1 tablet by mouth daily.        [place], CNM 11/17/2012 9:53 PM

## 2012-11-17 NOTE — MAU Note (Signed)
Pt reports no fetal movement since yesterday. Pt reports bleeding after intercourse this am.

## 2012-11-18 ENCOUNTER — Ambulatory Visit (HOSPITAL_COMMUNITY)
Admission: RE | Admit: 2012-11-18 | Discharge: 2012-11-18 | Disposition: A | Discharge status: Home / Self Care | Payer: Medicaid Other | Source: Ambulatory Visit | Attending: Advanced Practice Midwife | Admitting: Advanced Practice Midwife

## 2012-11-18 ENCOUNTER — Ambulatory Visit (HOSPITAL_COMMUNITY): Admission: RE | Admit: 2012-11-18 | Payer: Medicaid Other | Source: Ambulatory Visit

## 2012-11-18 DIAGNOSIS — E669 Obesity, unspecified: Secondary | ICD-10-CM | POA: Insufficient documentation

## 2012-11-18 DIAGNOSIS — Z3689 Encounter for other specified antenatal screening: Secondary | ICD-10-CM | POA: Insufficient documentation

## 2012-11-18 DIAGNOSIS — Z0489 Encounter for examination and observation for other specified reasons: Secondary | ICD-10-CM

## 2012-11-18 DIAGNOSIS — O99330 Smoking (tobacco) complicating pregnancy, unspecified trimester: Secondary | ICD-10-CM | POA: Insufficient documentation

## 2012-11-19 ENCOUNTER — Encounter: Payer: Self-pay | Admitting: Advanced Practice Midwife

## 2012-11-19 ENCOUNTER — Ambulatory Visit (INDEPENDENT_AMBULATORY_CARE_PROVIDER_SITE_OTHER): Payer: Medicaid Other | Admitting: Advanced Practice Midwife

## 2012-11-19 VITALS — BP 115/78 | Temp 98.1°F | Wt 247.2 lb

## 2012-11-19 DIAGNOSIS — Z3483 Encounter for supervision of other normal pregnancy, third trimester: Secondary | ICD-10-CM

## 2012-11-19 DIAGNOSIS — Z3480 Encounter for supervision of other normal pregnancy, unspecified trimester: Secondary | ICD-10-CM

## 2012-11-19 LAB — POCT URINALYSIS DIPSTICK
Leukocytes, UA: NEGATIVE
Nitrite, UA: NEGATIVE
Urobilinogen, UA: NEGATIVE
pH, UA: 5

## 2012-11-19 NOTE — Progress Notes (Signed)
Pulse. Patient states she has no concerns

## 2012-11-19 NOTE — Progress Notes (Signed)
Subjective: Victoria Robinson is a 27 y.o. at 28 weeks by 17 wk [place]  Patient denies vaginal leaking of fluid or bleeding, denies contractions.  Reports positive fetal movment.  Denies concerns today. Patient considering Mirena IUD. Reports irregular contractions that are uncomfortable, states they are not consistent, come and go, never more than 1-2 in an hour.   Objective: Filed Vitals:   11/19/12 0929  BP: 115/78  Temp: 98.1 F (36.7 C)   150 FHR 28 Fundal Height Fetal Position breech   Assessment: Patient Active Problem List   Diagnosis Date Noted  . Supervision of other normal pregnancy 10/15/2012    Plan: Patient to return to clinic in 2 weeks Reviewed warning signs in pregnancy. Patient to call with concerns PRN. Reviewed triage location. Reviewed exercises to help rotate baby. Confirm position between 32-34 weeks for patient. Reviewed [place] report. Reviewed IUD information today.  20 min spent with patient greater than 80% spent in counseling and coordination of care.    Yeraldy Spike Wilson Singer CNM

## 2012-12-03 ENCOUNTER — Encounter: Payer: Medicaid Other | Admitting: Advanced Practice Midwife

## 2012-12-09 ENCOUNTER — Other Ambulatory Visit: Payer: Self-pay

## 2012-12-10 ENCOUNTER — Encounter: Payer: Medicaid Other | Admitting: Advanced Practice Midwife

## 2012-12-10 ENCOUNTER — Ambulatory Visit (INDEPENDENT_AMBULATORY_CARE_PROVIDER_SITE_OTHER): Payer: Medicaid Other | Admitting: Advanced Practice Midwife

## 2012-12-10 ENCOUNTER — Encounter: Payer: Self-pay | Admitting: Advanced Practice Midwife

## 2012-12-10 VITALS — BP 120/76 | Temp 98.4°F | Wt 248.0 lb

## 2012-12-10 DIAGNOSIS — Z3480 Encounter for supervision of other normal pregnancy, unspecified trimester: Secondary | ICD-10-CM

## 2012-12-10 DIAGNOSIS — Z3483 Encounter for supervision of other normal pregnancy, third trimester: Secondary | ICD-10-CM

## 2012-12-10 LAB — POCT URINALYSIS DIPSTICK
Bilirubin, UA: NEGATIVE
Blood, UA: NEGATIVE
Nitrite, UA: NEGATIVE
pH, UA: 6

## 2012-12-10 NOTE — Progress Notes (Signed)
Subjective: Victoria Robinson is a 27 y.o. at 38 weeks  Patient denies vaginal leaking of fluid or bleeding, denies contractions.  Reports positive fetal movment.  Denies concerns today.  Objective: Filed Vitals:   12/10/12 1332  BP: 120/76  Temp: 98.4 F (36.9 C)   150 FHR 31 Fundal Height Fetal Position unable to determine, [place] if uncertain NV  Assessment: Patient Active Problem List   Diagnosis Date Noted  . Supervision of other normal pregnancy 10/15/2012    Plan: Patient to return to clinic in 2 weeks Plan [place] if uncertain of fetal lie NV Reviewed warning signs in pregnancy. Patient to call with concerns PRN. Reviewed triage location.  20 min spent with patient greater than 80% spent in counseling and coordination of care.   Teosha Casso Wilson Singer CNM

## 2012-12-10 NOTE — Progress Notes (Signed)
HR - 73 Pt in office for routine OB visit

## 2012-12-24 ENCOUNTER — Encounter: Payer: Medicaid Other | Admitting: Advanced Practice Midwife

## 2012-12-27 ENCOUNTER — Encounter: Payer: Self-pay | Admitting: Obstetrics & Gynecology

## 2012-12-27 ENCOUNTER — Ambulatory Visit (INDEPENDENT_AMBULATORY_CARE_PROVIDER_SITE_OTHER): Payer: Medicaid Other | Admitting: Obstetrics & Gynecology

## 2012-12-27 VITALS — BP 130/83 | Temp 98.3°F | Wt 249.0 lb

## 2012-12-27 DIAGNOSIS — Z3483 Encounter for supervision of other normal pregnancy, third trimester: Secondary | ICD-10-CM

## 2012-12-27 DIAGNOSIS — O99891 Other specified diseases and conditions complicating pregnancy: Secondary | ICD-10-CM

## 2012-12-27 DIAGNOSIS — Z3480 Encounter for supervision of other normal pregnancy, unspecified trimester: Secondary | ICD-10-CM

## 2012-12-27 DIAGNOSIS — E669 Obesity, unspecified: Secondary | ICD-10-CM

## 2012-12-27 DIAGNOSIS — O9A313 Physical abuse complicating pregnancy, third trimester: Secondary | ICD-10-CM | POA: Insufficient documentation

## 2012-12-27 LAB — POCT URINALYSIS DIPSTICK
Blood, UA: NEGATIVE
Glucose, UA: NEGATIVE
Nitrite, UA: NEGATIVE
Spec Grav, UA: 1.01
Urobilinogen, UA: NEGATIVE
pH, UA: 6

## 2012-12-27 MED ORDER — CYCLOBENZAPRINE HCL 10 MG PO TABS: 10.0000 mg | ORAL_TABLET | Freq: Three times a day (TID) | ORAL | Status: DC | PRN

## 2012-12-27 NOTE — Patient Instructions (Signed)
Nonstress Test The nonstress test is a procedure that monitors the fetus's heartbeat. The test will monitor the heartbeat when the fetus is at rest and while the fetus is moving. In a healthy fetus, there will be an increase in fetal heart rate when the fetus moves or kicks. The heart rate will decrease at rest. This test helps determine if the fetus is healthy. The test may take up to a half hour. Your caregiver will look at a number of patterns in the heart rate tracing to make sure your baby is thriving. If there is concern, your caregiver may order additional tests or may suggest another course of action. This test is often done in the third trimester and can help determine if an early delivery is needed and safe. Common reasons to have this test are:  You are past your due date.  You have a high-risk pregnancy.  You are feeling less movement than normal.  You have lost a pregnancy in the past.  Your caregiver suspects fetal growth problems.  You have too much or too little amniotic fluid. BEFORE THE PROCEDURE  Eat a meal right before the test or as directed by your caregiver. Food may help stimulate fetal movements.  Use the restroom right before the test. PROCEDURE  Two belts will be placed around your abdomen. These belts have monitors attached to them. One records the fetal heart rate and the other records uterine contractions.  You may be asked to lie down on your side or to stay sitting upright.  You may be given a button to press when you feel movement.  The fetal heartbeat is listened to and watched on a screen. The heartbeat is recorded on a sheet of paper.  If the fetus seems to be sleeping, you may be asked to drink some juice or soda, gently press your abdomen, or make some noise to wake the fetus. AFTER THE PROCEDURE  Your caregiver will discuss the test results with you and make recommendations for the near future. Document Released: 01/10/2002 Document Revised:  05/17/2012 Document Reviewed: 02/24/2012 [employer] Patient Information 2014 [employer].  
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
## 2012-12-28 ENCOUNTER — Other Ambulatory Visit: Payer: Self-pay | Admitting: Obstetrics & Gynecology

## 2012-12-28 DIAGNOSIS — O9A313 Physical abuse complicating pregnancy, third trimester: Secondary | ICD-10-CM

## 2012-12-28 DIAGNOSIS — O3663X1 Maternal care for excessive fetal growth, third trimester, fetus 1: Secondary | ICD-10-CM

## 2012-12-31 ENCOUNTER — Encounter (HOSPITAL_COMMUNITY): Payer: Self-pay | Admitting: *Deleted

## 2012-12-31 ENCOUNTER — Inpatient Hospital Stay (HOSPITAL_COMMUNITY)
Admission: AD | Admit: 2012-12-31 | Discharge: 2012-12-31 | Disposition: A | Discharge status: Home / Self Care | Payer: Medicaid Other | Source: Ambulatory Visit | Attending: Obstetrics | Admitting: Obstetrics

## 2012-12-31 DIAGNOSIS — M549 Dorsalgia, unspecified: Secondary | ICD-10-CM | POA: Insufficient documentation

## 2012-12-31 DIAGNOSIS — R197 Diarrhea, unspecified: Secondary | ICD-10-CM | POA: Insufficient documentation

## 2012-12-31 DIAGNOSIS — O4700 False labor before 37 completed weeks of gestation, unspecified trimester: Secondary | ICD-10-CM | POA: Insufficient documentation

## 2012-12-31 DIAGNOSIS — O212 Late vomiting of pregnancy: Secondary | ICD-10-CM | POA: Insufficient documentation

## 2012-12-31 DIAGNOSIS — O219 Vomiting of pregnancy, unspecified: Secondary | ICD-10-CM

## 2012-12-31 DIAGNOSIS — O479 False labor, unspecified: Secondary | ICD-10-CM

## 2012-12-31 LAB — URINALYSIS, ROUTINE W REFLEX MICROSCOPIC
Bilirubin Urine: NEGATIVE
Glucose, UA: NEGATIVE mg/dL
Hgb urine dipstick: NEGATIVE
Protein, ur: NEGATIVE mg/dL
Specific Gravity, Urine: 1.025 (ref 1.005–1.030)

## 2012-12-31 LAB — URINE MICROSCOPIC-ADD ON

## 2012-12-31 MED ORDER — FAMOTIDINE 20 MG PO TABS
20.0000 mg | ORAL_TABLET | Freq: Once | ORAL | Status: AC
  Administered 2012-12-31: 20 mg via ORAL
  Filled 2012-12-31: qty 1

## 2012-12-31 MED ORDER — ONDANSETRON HCL 4 MG PO TABS
8.0000 mg | ORAL_TABLET | Freq: Once | ORAL | Status: AC
  Administered 2012-12-31: 8 mg via ORAL
  Filled 2012-12-31: qty 2

## 2012-12-31 MED ORDER — NIFEDIPINE 10 MG PO CAPS
10.0000 mg | ORAL_CAPSULE | Freq: Once | ORAL | Status: DC
  Filled 2012-12-31: qty 1

## 2012-12-31 MED ORDER — ONDANSETRON 8 MG/NS 50 ML IVPB: 8.0000 mg | Freq: Once | INTRAVENOUS | Status: DC

## 2012-12-31 MED ORDER — ONDANSETRON HCL 4 MG PO TABS: 4.0000 mg | ORAL_TABLET | Freq: Three times a day (TID) | ORAL | Status: DC | PRN

## 2012-12-31 NOTE — MAU Provider Note (Signed)
History     CSN: [id number]  Arrival date and time: 12/31/12 0232   None     Chief Complaint  Patient presents with  . Abdominal Pain  . Back Pain  . Nausea   HPI  Victoria Robinson is a 27 y.o. female G3P1011 at [redacted]w[redacted]d who presents with back pain times four days; typically alleviated by tylenol however, today tylenol did not help.  She ate thanksgiving dinner around 8 pm and shortly after that she started having nausea, abdominal pain and diarrhea. She has had two episodes of diarrhea.  She currently rates her back pain 6/10. She took flexeril at midnight and does not feel it helped.   OB History   Grav Para Term Preterm Abortions TAB SAB Ect Mult Living   3 1 1  1  1   1       Past Medical History  Diagnosis Date  . Shortness of breath   . Abnormal Pap smear     repeat WNL    Past Surgical History  Procedure Laterality Date  . Appendectomy      Family History  Problem Relation Age of Onset  . Hyperlipidemia Mother   . Hyperlipidemia Father   . Hypertension Father   . Diabetes Maternal Grandmother   . Diabetes Paternal Grandmother     History  Substance Use Topics  . Smoking status: Former Smoker    Quit date: 09/17/2012  . Smokeless tobacco: Never Used  . Alcohol Use: Yes     Comment: wine 4 oz or less    Allergies: No Known Allergies  Prescriptions prior to admission  Medication Sig Dispense Refill  . acetaminophen (TYLENOL) 500 MG tablet Take 1,000 mg by mouth every 6 (six) hours as needed for pain.      . cyclobenzaprine (FLEXERIL) 10 MG tablet Take 1 tablet (10 mg total) by mouth every 8 (eight) hours as needed for muscle spasms.  30 tablet  1  . Prenatal Vit-Fe Fumarate-FA (PRENATAL MULTIVITAMIN) TABS Take 1 tablet by mouth daily.       Results for orders placed during the hospital encounter of 12/31/12 (from the past 24 hour(s))  URINALYSIS, ROUTINE W REFLEX MICROSCOPIC     Status: Abnormal   Collection Time    12/31/12  2:40 AM       Result Value Range   Color, Urine YELLOW  YELLOW   APPearance CLEAR  CLEAR   Specific Gravity, Urine 1.025  1.005 - 1.030   pH 6.0  5.0 - 8.0   Glucose, UA NEGATIVE  NEGATIVE mg/dL   Hgb urine dipstick NEGATIVE  NEGATIVE   Bilirubin Urine NEGATIVE  NEGATIVE   Ketones, ur NEGATIVE  NEGATIVE mg/dL   Protein, ur NEGATIVE  NEGATIVE mg/dL   Urobilinogen, UA 0.2  0.0 - 1.0 mg/dL   Nitrite NEGATIVE  NEGATIVE   Leukocytes, UA SMALL (*) NEGATIVE  URINE MICROSCOPIC-ADD ON     Status: Abnormal   Collection Time    12/31/12  2:40 AM      Result Value Range   Squamous Epithelial / LPF FEW (*) RARE   WBC, UA 7-10  <3 WBC/hpf   Bacteria, UA FEW (*) RARE   Urine-Other MUCOUS PRESENT      Review of Systems  Constitutional: Negative for fever and chills.  Gastrointestinal: Positive for nausea, abdominal pain and diarrhea. Negative for vomiting and constipation.       Upper, mid abdominal pain   Genitourinary: Negative for  dysuria, urgency, frequency and hematuria.       No vaginal discharge. No vaginal bleeding. No dysuria.   Neurological: Negative for headaches.   Physical Exam   Blood pressure 129/75, pulse 91, temperature 98.8 F (37.1 C), resp. rate 18, height 5\' 1"  (1.549 m), weight 113.49 kg (250 lb 3.2 oz), last menstrual period 04/16/2012.  Physical Exam  Constitutional: She is oriented to person, place, and time. She appears well-developed and well-nourished. No distress.  HENT:  Head: Normocephalic.  Eyes: Pupils are equal, round, and reactive to light.  Neck: Neck supple.  Respiratory: Effort normal.  GI: Soft. She exhibits no distension. There is no tenderness. There is no rebound and no guarding.  Neurological: She is alert and oriented to person, place, and time.  Skin: Skin is warm. She is not diaphoretic.  Psychiatric: Her behavior is normal.    Dilation: Fingertip Effacement (%): Thick Cervical Position: Posterior Exam by:: J. Yarely Bebee NP.  Fetal  Tracing:  Baseline: 130 bpm  Variability: Moderate  Accelerations: 15x15 Decelerations: Variable  Toco: UI   MAU Course  Procedures None  MDM UA NST Zofran 8 mg tab Pepcid PO   Procardia offered to the patient; patient declined.   Assessment and Plan   A:  1. Braxton Hick's contraction   2. Nausea/vomiting in pregnancy   3. Diarrhea     P: Discharge home RX: Zofran Increase fluids Pregnancy support belt recommended for back pain  Victoria Castell IRENE NP  12/31/2012, 8:30 AM

## 2012-12-31 NOTE — MAU Note (Signed)
Pt refusing procardia, requesting to go home.  Blanche East NP notified.

## 2012-12-31 NOTE — MAU Note (Signed)
Pt G3 P1 at 34.3wks, having loose stools x 2, nausea and back pain after eating dinner around 8pm.

## 2012-12-31 NOTE — MAU Note (Signed)
J. Rasch NP at the bedside. 

## 2013-01-01 LAB — URINE CULTURE

## 2013-01-05 ENCOUNTER — Ambulatory Visit (INDEPENDENT_AMBULATORY_CARE_PROVIDER_SITE_OTHER): Payer: Medicaid Other

## 2013-01-05 ENCOUNTER — Ambulatory Visit (INDEPENDENT_AMBULATORY_CARE_PROVIDER_SITE_OTHER): Payer: Medicaid Other | Admitting: Obstetrics & Gynecology

## 2013-01-05 VITALS — BP 134/78 | Temp 98.2°F | Wt 248.0 lb

## 2013-01-05 DIAGNOSIS — Z1389 Encounter for screening for other disorder: Secondary | ICD-10-CM

## 2013-01-05 DIAGNOSIS — O0993 Supervision of high risk pregnancy, unspecified, third trimester: Secondary | ICD-10-CM

## 2013-01-05 DIAGNOSIS — O3663X1 Maternal care for excessive fetal growth, third trimester, fetus 1: Secondary | ICD-10-CM

## 2013-01-05 DIAGNOSIS — Z3483 Encounter for supervision of other normal pregnancy, third trimester: Secondary | ICD-10-CM

## 2013-01-05 DIAGNOSIS — Z3480 Encounter for supervision of other normal pregnancy, unspecified trimester: Secondary | ICD-10-CM

## 2013-01-05 DIAGNOSIS — O0990 Supervision of high risk pregnancy, unspecified, unspecified trimester: Secondary | ICD-10-CM

## 2013-01-05 DIAGNOSIS — E669 Obesity, unspecified: Secondary | ICD-10-CM

## 2013-01-05 DIAGNOSIS — O9A313 Physical abuse complicating pregnancy, third trimester: Secondary | ICD-10-CM

## 2013-01-05 LAB — POCT URINALYSIS DIPSTICK
Bilirubin, UA: NEGATIVE
Blood, UA: NEGATIVE
Glucose, UA: NEGATIVE
Ketones, UA: NEGATIVE
Nitrite, UA: NEGATIVE
Protein, UA: NEGATIVE
Spec Grav, UA: 1.015
Urobilinogen, UA: NEGATIVE
pH, UA: 6

## 2013-01-05 NOTE — Progress Notes (Signed)
Pulse 80 Pt states that she is doing well.

## 2013-01-07 ENCOUNTER — Encounter: Payer: Self-pay | Admitting: Obstetrics & Gynecology

## 2013-01-07 LAB — US OB DETAIL + 14 WK

## 2013-01-08 NOTE — Patient Instructions (Signed)
Patient information: Group B streptococcus and pregnancy (Beyond the Basics)  Authors Karen M Puopolo, MD, PhD Carol J Baker, MD Section Editors Charles J Lockwood, MD Daniel J Sexton, MD Deputy Editor Vanessa A Barss, MD Disclosures  All topics are updated as new evidence becomes available and our peer review process is complete.  Literature review current through: Feb 2014.  This topic last updated: Aug 04, 2011.  INTRODUCTION - Group B streptococcus (GBS) is a bacterium that can cause serious infections in pregnant women and newborn babies. GBS is one of many types of streptococcal bacteria, sometimes called "strep." This article discusses GBS, its effect on pregnant women and infants, and ways to prevent complications of GBS. More detailed information about GBS is available by subscription. (See "Group B streptococcal infection in pregnant women".) WHAT IS GROUP B STREP INFECTION? - GBS is commonly found in the digestive system and the vagina. In healthy adults, GBS is not harmful and does not cause problems. But in pregnant women and newborn infants, being infected with GBS can cause serious illness. Approximately one in three to four pregnant women in the [place] carries GBS in their gastrointestinal system and/or in their vagina. Carrying GBS is not the same as being infected. Carriers are not sick and do not need treatment during pregnancy. There is no treatment that can stop you from carrying GBS.  Pregnant women who are carriers of GBS infrequently become infected with GBS. GBS can cause urinary tract infections, infection of the amniotic fluid (bag of water), and infection of the uterus after delivery. GBS infections during pregnancy may lead to preterm labor.  Pregnant women who carry GBS can pass on the bacteria to their newborns, and some of those babies become infected with GBS. Newborns who are infected with GBS can develop pneumonia (lung infection), septicemia (blood infection), or  meningitis (infection of the lining of the brain and spinal cord). These complications can be prevented by giving intravenous antibiotics during labor to any woman who is at risk of GBS infection. You are at risk of GBS infection if: You have a urine culture during your current pregnancy showing GBS  You have a vaginal and rectal culture during your current pregnancy showing GBS  You had an infant infected with GBS in the past GROUP B STREP PREVENTION - Most doctors and nurses recommend a urine culture early in your pregnancy to be sure that you do not have a bladder infection without symptoms. If you urine culture shows GBS or other bacteria, you may be treated with an antibiotic. If you have symptoms of urinary infection, such as pain with urination, any time during your pregnancy, a urine culture is done. If GBS grows from the urine culture, it should be treated with an antibiotic, and you should also receive intravenous antibiotics during labor. Expert groups recommend that all pregnant women have a GBS culture at 35 to 37 weeks of pregnancy. The culture is done by swabbing the vagina and rectum. If your GBS culture is positive, you will be given an intravenous antibiotic during labor. If you have preterm labor, the culture is done then and an intravenous antibiotic is given until the baby is born or the labor is stopped by your health care provider. If you have a positive GBS culture and you have an allergy to penicillin, be sure your doctor and nurse are aware of this allergy and tell them what happened with the allergy. If you had only a rash or itching, this   is not a serious allergy, and you can receive a common drug related to the penicillin. If you had a serious allergy (for example, trouble breathing, swelling of your face) you may need an additional test to determine which antibiotic should be used during labor. Being treated with an antibiotic during labor greatly reduces the chance that you or  your newborn will develop infections related to GBS. It is important to note that young infants up to age 3 months can also develop septicemia, meningitis and other serious infections from GBS. Being treated with an antibiotic during labor does not reduce the chance that your baby will develop this later type of infection. There is currently no known way of preventing this later-onset GBS disease. WHERE TO GET MORE INFORMATION - Your healthcare provider is the best source of information for questions and concerns related to your medical problem.  

## 2013-01-12 ENCOUNTER — Encounter: Payer: Self-pay | Admitting: Obstetrics

## 2013-01-13 ENCOUNTER — Encounter: Payer: Medicaid Other | Admitting: Obstetrics & Gynecology

## 2013-01-14 ENCOUNTER — Other Ambulatory Visit: Payer: Self-pay | Admitting: *Deleted

## 2013-01-14 DIAGNOSIS — B9689 Other specified bacterial agents as the cause of diseases classified elsewhere: Secondary | ICD-10-CM

## 2013-01-14 MED ORDER — METRONIDAZOLE 500 MG PO TABS: 500.0000 mg | ORAL_TABLET | Freq: Two times a day (BID) | ORAL | Status: DC

## 2013-01-18 ENCOUNTER — Encounter: Payer: Self-pay | Admitting: Obstetrics

## 2013-01-18 ENCOUNTER — Ambulatory Visit (INDEPENDENT_AMBULATORY_CARE_PROVIDER_SITE_OTHER): Payer: Medicaid Other | Admitting: Obstetrics

## 2013-01-18 VITALS — BP 121/83 | Temp 98.4°F | Wt 251.0 lb

## 2013-01-18 DIAGNOSIS — Z3480 Encounter for supervision of other normal pregnancy, unspecified trimester: Secondary | ICD-10-CM

## 2013-01-18 DIAGNOSIS — Z3483 Encounter for supervision of other normal pregnancy, third trimester: Secondary | ICD-10-CM

## 2013-01-18 DIAGNOSIS — E669 Obesity, unspecified: Secondary | ICD-10-CM

## 2013-01-18 LAB — POCT URINALYSIS DIPSTICK
Blood, UA: NEGATIVE
Glucose, UA: NEGATIVE
Protein, UA: NEGATIVE
Spec Grav, UA: 1.015
Urobilinogen, UA: NEGATIVE
pH, UA: 6.5

## 2013-01-18 NOTE — Progress Notes (Signed)
Pulse 71 Pt states that she is doing well. Pt needs GBS at this visit.

## 2013-01-25 ENCOUNTER — Ambulatory Visit (INDEPENDENT_AMBULATORY_CARE_PROVIDER_SITE_OTHER): Payer: Medicaid Other | Admitting: Advanced Practice Midwife

## 2013-01-25 VITALS — BP 124/81 | Temp 98.7°F | Wt 256.0 lb

## 2013-01-25 DIAGNOSIS — Z3480 Encounter for supervision of other normal pregnancy, unspecified trimester: Secondary | ICD-10-CM

## 2013-01-25 DIAGNOSIS — Z3483 Encounter for supervision of other normal pregnancy, third trimester: Secondary | ICD-10-CM

## 2013-01-25 LAB — POCT URINALYSIS DIPSTICK
Blood, UA: NEGATIVE
Ketones, UA: NEGATIVE
Protein, UA: NEGATIVE
Spec Grav, UA: 1.015

## 2013-01-25 NOTE — Progress Notes (Signed)
Patient doing well. Confirmed vertex by [place]. Ready for delivery. GBS done today.  Ryott Rafferty Wilson Singer CNM

## 2013-01-25 NOTE — Progress Notes (Signed)
HR - 81 Pt in office today for routine OB visit, reports low back pain, reports regular contractions at times that stop with rest.

## 2013-01-27 LAB — STREP B DNA PROBE: GBSP: POSITIVE

## 2013-02-01 ENCOUNTER — Ambulatory Visit (INDEPENDENT_AMBULATORY_CARE_PROVIDER_SITE_OTHER): Payer: Medicaid Other | Admitting: Advanced Practice Midwife

## 2013-02-01 ENCOUNTER — Encounter: Payer: Self-pay | Admitting: Advanced Practice Midwife

## 2013-02-01 VITALS — BP 127/68 | Temp 97.4°F | Wt 253.0 lb

## 2013-02-01 DIAGNOSIS — Z3480 Encounter for supervision of other normal pregnancy, unspecified trimester: Secondary | ICD-10-CM

## 2013-02-01 DIAGNOSIS — Z3483 Encounter for supervision of other normal pregnancy, third trimester: Secondary | ICD-10-CM

## 2013-02-01 DIAGNOSIS — O99820 Streptococcus B carrier state complicating pregnancy: Secondary | ICD-10-CM | POA: Insufficient documentation

## 2013-02-01 LAB — POCT URINALYSIS DIPSTICK
Bilirubin, UA: NEGATIVE
Blood, UA: NEGATIVE
Glucose, UA: NEGATIVE
Leukocytes, UA: NEGATIVE
Nitrite, UA: NEGATIVE

## 2013-02-01 NOTE — Progress Notes (Signed)
Pulse- 71 Pt states she is having pressure in her lower abdomen and in her bottom. Pt states she has lost some of her mucous plug.
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
## 2013-02-02 ENCOUNTER — Encounter (HOSPITAL_COMMUNITY): Payer: Self-pay

## 2013-02-02 ENCOUNTER — Inpatient Hospital Stay (HOSPITAL_COMMUNITY)
Admission: AD | Admit: 2013-02-02 | Discharge: 2013-02-02 | Disposition: A | Discharge status: Home / Self Care | Payer: Medicaid Other | Source: Ambulatory Visit | Attending: Obstetrics | Admitting: Obstetrics

## 2013-02-02 ENCOUNTER — Inpatient Hospital Stay (HOSPITAL_COMMUNITY)
Admission: AD | Admit: 2013-02-02 | Discharge: 2013-02-05 | DRG: 775 | Disposition: A | Discharge status: Home / Self Care | Payer: Medicaid Other | Source: Ambulatory Visit | Attending: Obstetrics | Admitting: Obstetrics

## 2013-02-02 ENCOUNTER — Encounter (HOSPITAL_COMMUNITY): Payer: Self-pay | Admitting: *Deleted

## 2013-02-02 DIAGNOSIS — E669 Obesity, unspecified: Secondary | ICD-10-CM

## 2013-02-02 DIAGNOSIS — Z22330 Carrier of Group B streptococcus: Secondary | ICD-10-CM

## 2013-02-02 DIAGNOSIS — O479 False labor, unspecified: Secondary | ICD-10-CM | POA: Insufficient documentation

## 2013-02-02 DIAGNOSIS — IMO0001 Reserved for inherently not codable concepts without codable children: Secondary | ICD-10-CM

## 2013-02-02 DIAGNOSIS — O99892 Other specified diseases and conditions complicating childbirth: Principal | ICD-10-CM | POA: Diagnosis present

## 2013-02-02 DIAGNOSIS — Z87891 Personal history of nicotine dependence: Secondary | ICD-10-CM

## 2013-02-02 DIAGNOSIS — O9A313 Physical abuse complicating pregnancy, third trimester: Secondary | ICD-10-CM

## 2013-02-02 DIAGNOSIS — O99820 Streptococcus B carrier state complicating pregnancy: Secondary | ICD-10-CM

## 2013-02-02 HISTORY — DX: Unspecified asthma, uncomplicated: J45.909

## 2013-02-02 MED ORDER — OXYCODONE-ACETAMINOPHEN 5-325 MG PO TABS
2.0000 | ORAL_TABLET | Freq: Once | ORAL | Status: AC
  Administered 2013-02-02: 2 via ORAL
  Filled 2013-02-02: qty 2

## 2013-02-02 NOTE — MAU Note (Signed)
Pt states she has been having contractions since yesterday. Pt states contractions became worse.

## 2013-02-02 NOTE — MAU Note (Signed)
Pt c/o uc every 10-15 mins. Is having some bloody show as well. Denies SROM. +FM.

## 2013-02-02 NOTE — MAU Note (Signed)
Pt presents with contractions and vaginal bleeding.

## 2013-02-03 ENCOUNTER — Encounter (HOSPITAL_COMMUNITY): Payer: Medicaid Other | Admitting: Anesthesiology

## 2013-02-03 ENCOUNTER — Inpatient Hospital Stay (HOSPITAL_COMMUNITY): Payer: Medicaid Other | Admitting: Anesthesiology

## 2013-02-03 ENCOUNTER — Encounter (HOSPITAL_COMMUNITY): Payer: Self-pay | Admitting: Obstetrics

## 2013-02-03 DIAGNOSIS — IMO0001 Reserved for inherently not codable concepts without codable children: Secondary | ICD-10-CM

## 2013-02-03 LAB — CBC
HCT: 36 % (ref 36.0–46.0)
HEMOGLOBIN: 12.1 g/dL (ref 12.0–15.0)
MCH: 30.3 pg (ref 26.0–34.0)
MCHC: 33.6 g/dL (ref 30.0–36.0)
MCV: 90.2 fL (ref 78.0–100.0)
PLATELETS: 225 10*3/uL (ref 150–400)
RBC: 3.99 MIL/uL (ref 3.87–5.11)
RDW: 13.5 % (ref 11.5–15.5)
WBC: 9.9 10*3/uL (ref 4.0–10.5)

## 2013-02-03 LAB — TYPE AND SCREEN
ABO/RH(D): O POS
ANTIBODY SCREEN: NEGATIVE

## 2013-02-03 LAB — RPR: RPR: NONREACTIVE

## 2013-02-03 LAB — ABO/RH: ABO/RH(D): O POS

## 2013-02-03 MED ORDER — LACTATED RINGERS IV SOLN: INTRAVENOUS | Status: DC

## 2013-02-03 MED ORDER — PENICILLIN G POTASSIUM 5000000 UNITS IJ SOLR
2.5000 10*6.[IU] | INTRAVENOUS | Status: DC
  Administered 2013-02-03: 2.5 10*6.[IU] via INTRAVENOUS
  Filled 2013-02-03 (×7): qty 2.5

## 2013-02-03 MED ORDER — PHENYLEPHRINE 40 MCG/ML (10ML) SYRINGE FOR IV PUSH (FOR BLOOD PRESSURE SUPPORT)
80.0000 ug | PREFILLED_SYRINGE | INTRAVENOUS | Status: DC | PRN
  Filled 2013-02-03: qty 10
  Filled 2013-02-03: qty 2

## 2013-02-03 MED ORDER — DIBUCAINE 1 % RE OINT: 1.0000 "application " | TOPICAL_OINTMENT | RECTAL | Status: DC | PRN

## 2013-02-03 MED ORDER — LANOLIN HYDROUS EX OINT
TOPICAL_OINTMENT | CUTANEOUS | Status: DC | PRN
Start: 2013-02-03 — End: 2013-02-05

## 2013-02-03 MED ORDER — DIPHENHYDRAMINE HCL 50 MG/ML IJ SOLN
12.5000 mg | INTRAMUSCULAR | Status: DC | PRN
  Administered 2013-02-03: 12.5 mg via INTRAVENOUS
  Filled 2013-02-03: qty 1

## 2013-02-03 MED ORDER — ZOLPIDEM TARTRATE 5 MG PO TABS: 5.0000 mg | ORAL_TABLET | Freq: Every evening | ORAL | Status: DC | PRN

## 2013-02-03 MED ORDER — CITRIC ACID-SODIUM CITRATE 334-500 MG/5ML PO SOLN: 30.0000 mL | ORAL | Status: DC | PRN

## 2013-02-03 MED ORDER — LACTATED RINGERS IV SOLN: 500.0000 mL | INTRAVENOUS | Status: DC | PRN

## 2013-02-03 MED ORDER — EPHEDRINE 5 MG/ML INJ
10.0000 mg | INTRAVENOUS | Status: DC | PRN
  Filled 2013-02-03: qty 2

## 2013-02-03 MED ORDER — PHENYLEPHRINE 40 MCG/ML (10ML) SYRINGE FOR IV PUSH (FOR BLOOD PRESSURE SUPPORT)
80.0000 ug | PREFILLED_SYRINGE | INTRAVENOUS | Status: DC | PRN
  Filled 2013-02-03: qty 2

## 2013-02-03 MED ORDER — LACTATED RINGERS IV SOLN
500.0000 mL | Freq: Once | INTRAVENOUS | Status: AC
  Administered 2013-02-03: 500 mL via INTRAVENOUS

## 2013-02-03 MED ORDER — LIDOCAINE HCL (PF) 1 % IJ SOLN
30.0000 mL | INTRAMUSCULAR | Status: DC | PRN
Start: 2013-02-03 — End: 2013-02-03
  Filled 2013-02-03: qty 30

## 2013-02-03 MED ORDER — TETANUS-DIPHTH-ACELL PERTUSSIS 5-2.5-18.5 LF-MCG/0.5 IM SUSP
0.5000 mL | Freq: Once | INTRAMUSCULAR | Status: AC
  Administered 2013-02-04: 0.5 mL via INTRAMUSCULAR
  Filled 2013-02-03: qty 0.5

## 2013-02-03 MED ORDER — IBUPROFEN 600 MG PO TABS: 600.0000 mg | ORAL_TABLET | Freq: Four times a day (QID) | ORAL | Status: DC | PRN

## 2013-02-03 MED ORDER — IBUPROFEN 600 MG PO TABS
600.0000 mg | ORAL_TABLET | Freq: Four times a day (QID) | ORAL | Status: DC
  Administered 2013-02-03 – 2013-02-05 (×7): 600 mg via ORAL
  Filled 2013-02-03 (×7): qty 1

## 2013-02-03 MED ORDER — OXYCODONE-ACETAMINOPHEN 5-325 MG PO TABS
1.0000 | ORAL_TABLET | ORAL | Status: DC | PRN
  Administered 2013-02-03 – 2013-02-05 (×4): 1 via ORAL
  Filled 2013-02-03 (×4): qty 1

## 2013-02-03 MED ORDER — FENTANYL 2.5 MCG/ML BUPIVACAINE 1/10 % EPIDURAL INFUSION (WH - ANES)
14.0000 mL/h | INTRAMUSCULAR | Status: DC | PRN
  Filled 2013-02-03: qty 125

## 2013-02-03 MED ORDER — FLEET ENEMA 7-19 GM/118ML RE ENEM: 1.0000 | ENEMA | RECTAL | Status: DC | PRN

## 2013-02-03 MED ORDER — ONDANSETRON HCL 4 MG/2ML IJ SOLN: 4.0000 mg | Freq: Four times a day (QID) | INTRAMUSCULAR | Status: DC | PRN

## 2013-02-03 MED ORDER — BENZOCAINE-MENTHOL 20-0.5 % EX AERO: 1.0000 "application " | INHALATION_SPRAY | CUTANEOUS | Status: DC | PRN

## 2013-02-03 MED ORDER — OXYTOCIN 40 UNITS IN LACTATED RINGERS INFUSION - SIMPLE MED
62.5000 mL/h | INTRAVENOUS | Status: DC
  Filled 2013-02-03: qty 1000

## 2013-02-03 MED ORDER — SENNOSIDES-DOCUSATE SODIUM 8.6-50 MG PO TABS
2.0000 | ORAL_TABLET | ORAL | Status: DC
  Administered 2013-02-03 – 2013-02-04 (×2): 2 via ORAL
  Filled 2013-02-03: qty 2
  Filled 2013-02-03: qty 1

## 2013-02-03 MED ORDER — OXYTOCIN 40 UNITS IN LACTATED RINGERS INFUSION - SIMPLE MED: 62.5000 mL/h | INTRAVENOUS | Status: DC | PRN

## 2013-02-03 MED ORDER — DEXTROSE 5 % IV SOLN: 5.0000 10*6.[IU] | Freq: Once | INTRAVENOUS | Status: DC

## 2013-02-03 MED ORDER — OXYTOCIN 40 UNITS IN LACTATED RINGERS INFUSION - SIMPLE MED: 62.5000 mL/h | INTRAVENOUS | Status: DC

## 2013-02-03 MED ORDER — PENICILLIN G POTASSIUM 5000000 UNITS IJ SOLR: 2.5000 10*6.[IU] | INTRAVENOUS | Status: DC

## 2013-02-03 MED ORDER — OXYCODONE-ACETAMINOPHEN 5-325 MG PO TABS: 1.0000 | ORAL_TABLET | ORAL | Status: DC | PRN

## 2013-02-03 MED ORDER — LACTATED RINGERS IV SOLN
INTRAVENOUS | Status: DC
  Administered 2013-02-03: 01:00:00 via INTRAVENOUS

## 2013-02-03 MED ORDER — SIMETHICONE 80 MG PO CHEW: 80.0000 mg | CHEWABLE_TABLET | ORAL | Status: DC | PRN

## 2013-02-03 MED ORDER — OXYTOCIN BOLUS FROM INFUSION: 500.0000 mL | INTRAVENOUS | Status: DC

## 2013-02-03 MED ORDER — LIDOCAINE HCL (PF) 1 % IJ SOLN
INTRAMUSCULAR | Status: DC | PRN
  Administered 2013-02-03 (×3): 5 mL

## 2013-02-03 MED ORDER — DIPHENHYDRAMINE HCL 25 MG PO CAPS: 25.0000 mg | ORAL_CAPSULE | Freq: Four times a day (QID) | ORAL | Status: DC | PRN

## 2013-02-03 MED ORDER — LIDOCAINE HCL (PF) 1 % IJ SOLN: 30.0000 mL | INTRAMUSCULAR | Status: DC | PRN

## 2013-02-03 MED ORDER — FENTANYL 2.5 MCG/ML BUPIVACAINE 1/10 % EPIDURAL INFUSION (WH - ANES)
INTRAMUSCULAR | Status: DC | PRN
  Administered 2013-02-03: 14 mL/h via EPIDURAL

## 2013-02-03 MED ORDER — ONDANSETRON HCL 4 MG/2ML IJ SOLN: 4.0000 mg | INTRAMUSCULAR | Status: DC | PRN

## 2013-02-03 MED ORDER — PRENATAL MULTIVITAMIN CH
1.0000 | ORAL_TABLET | Freq: Every day | ORAL | Status: DC
  Administered 2013-02-03 – 2013-02-04 (×2): 1 via ORAL
  Filled 2013-02-03 (×2): qty 1

## 2013-02-03 MED ORDER — ONDANSETRON HCL 4 MG PO TABS
4.0000 mg | ORAL_TABLET | ORAL | Status: DC | PRN
  Administered 2013-02-03: 4 mg via ORAL
  Filled 2013-02-03: qty 1

## 2013-02-03 MED ORDER — PENICILLIN G POTASSIUM 5000000 UNITS IJ SOLR
5.0000 10*6.[IU] | Freq: Once | INTRAVENOUS | Status: AC
  Administered 2013-02-03: 5 10*6.[IU] via INTRAVENOUS
  Filled 2013-02-03: qty 5

## 2013-02-03 MED ORDER — ACETAMINOPHEN 325 MG PO TABS: 650.0000 mg | ORAL_TABLET | ORAL | Status: DC | PRN

## 2013-02-03 MED ORDER — OXYTOCIN BOLUS FROM INFUSION
500.0000 mL | INTRAVENOUS | Status: DC
Start: 2013-02-03 — End: 2013-02-03

## 2013-02-03 MED ORDER — WITCH HAZEL-GLYCERIN EX PADS: 1.0000 "application " | MEDICATED_PAD | CUTANEOUS | Status: DC | PRN

## 2013-02-03 NOTE — Anesthesia Preprocedure Evaluation (Addendum)
Anesthesia Evaluation  Patient identified by MRN, date of birth, ID band Patient awake    Reviewed: Allergy & Precautions, H&P , NPO status , Patient's Chart, lab work & pertinent test results  History of Anesthesia Complications Negative for: history of anesthetic complications  Airway Mallampati: II TM Distance: >3 FB Neck ROM: full    Dental no notable dental hx. (+) Teeth Intact   Pulmonary neg pulmonary ROS, former smoker,  breath sounds clear to auscultation  Pulmonary exam normal       Cardiovascular negative cardio ROS  Rhythm:regular Rate:Normal     Neuro/Psych negative neurological ROS  negative psych ROS   GI/Hepatic negative GI ROS, Neg liver ROS,   Endo/Other  negative endocrine ROSMorbid obesity  Renal/GU negative Renal ROS  negative genitourinary   Musculoskeletal   Abdominal Normal abdominal exam  (+)   Peds  Hematology negative hematology ROS (+)   Anesthesia Other Findings   Reproductive/Obstetrics (+) Pregnancy                           Anesthesia Physical Anesthesia Plan  ASA: III  Anesthesia Plan: Epidural   Post-op Pain Management:    Induction:   Airway Management Planned:   Additional Equipment:   Intra-op Plan:   Post-operative Plan:   Informed Consent: I have reviewed the patients History and Physical, chart, labs and discussed the procedure including the risks, benefits and alternatives for the proposed anesthesia with the patient or authorized representative who has indicated his/her understanding and acceptance.     Plan Discussed with:   Anesthesia Plan Comments:         Anesthesia Quick Evaluation  

## 2013-02-03 NOTE — L&D Delivery Note (Signed)
Delivery Note At 4:48 AM a viable female was delivered via Vaginal, Spontaneous Delivery (Presentation: Left Occiput ).  APGAR: 9, 9; weight .   Placenta status: Intact, Spontaneous.  Cord: 3 vessels with the following complications: None.  Cord pH: none  Anesthesia: Epidural  Episiotomy: none Lacerations: none Suture Repair: none Est. Blood Loss (mL): 350  Mom to postpartum.  Baby to Couplet care / Skin to Skin.  Shivangi Lutz A 02/03/2013, 5:00 AM

## 2013-02-03 NOTE — Progress Notes (Signed)
Wilburn CorneliaDonnika L Cullers is a 28 y.o. G3P1011 at 745w3d by LMP admitted for active labor  Subjective:   Objective: BP 119/97  Pulse 88  Temp(Src) 98 F (36.7 C) (Oral)  Resp 18  Ht 5\' 1"  (1.549 m)  Wt 254 lb (115.214 kg)  BMI 48.02 kg/m2  SpO2 100%  LMP 04/16/2012   Total I/O In: -  Out: 400 [Urine:400]  FHT:  FHR: 140-150 bpm, variability: moderate,  accelerations:  Present,  decelerations:  Present Variable UC:   regular, every 2 minutes SVE:   Dilation: Lip/rim Effacement (%): 100 Station: 0;+1 Exam by:: GPayne, RN  Labs: Lab Results  Component Value Date   WBC 9.9 02/03/2013   HGB 12.1 02/03/2013   HCT 36.0 02/03/2013   MCV 90.2 02/03/2013   PLT 225 02/03/2013    Assessment / Plan: Spontaneous labor, progressing normally  Labor: Progressing normally Preeclampsia:  n/a Fetal Wellbeing:  Category I Pain Control:  Epidural I/D:  n/a Anticipated MOD:  NSVD  HARPER,CHARLES A 02/03/2013, 4:33 AM

## 2013-02-03 NOTE — H&P (Signed)
Victoria Robinson is a 28 y.o. female presenting for UC's. Maternal Medical History:  Reason for admission: Contractions.  28 yo G3 P1011.  EDC 02-08-13.  Presents with UC's.  Contractions: Onset was 13-24 hours ago.    Fetal activity: Perceived fetal activity is normal.   Last perceived fetal movement was within the past hour.    Prenatal complications: no prenatal complications Prenatal Complications - Diabetes: none.    OB History   Grav Para Term Preterm Abortions TAB SAB Ect Mult Living   3 1 1  1  1   1      Past Medical History  Diagnosis Date  . Shortness of breath   . Abnormal Pap smear     repeat WNL  . Asthma    Past Surgical History  Procedure Laterality Date  . Appendectomy     Family History: family history includes Diabetes in her maternal grandmother and paternal grandmother; Hyperlipidemia in her father and mother; Hypertension in her father. Social History:  reports that she quit smoking about 4 months ago. She has never used smokeless tobacco. She reports that she drinks alcohol. She reports that she does not use illicit drugs.   Prenatal Transfer Tool  Maternal Diabetes: No Genetic Screening: Normal Maternal Ultrasounds/Referrals: Normal Fetal Ultrasounds or other Referrals:  None Maternal Substance Abuse:  No Significant Maternal Medications:  None Significant Maternal Lab Results:  None Other Comments:  None  Review of Systems  All other systems reviewed and are negative.    Dilation: 3.5 Effacement (%): 70 Station: -2 Exam by:: B.Bethea RN Pulse 91, temperature 98 F (36.7 C), temperature source Oral, resp. rate 18, height 5\' 1"  (1.549 m), weight 254 lb (115.214 kg), last menstrual period 04/16/2012, SpO2 100.00%. Maternal Exam:  Uterine Assessment: Contraction strength is moderate.  Contraction frequency is regular.   Abdomen: Patient reports no abdominal tenderness. Fetal presentation: vertex  Pelvis: adequate for delivery.    Cervix: Cervix evaluated by digital exam.     Physical Exam  Nursing note and vitals reviewed. Constitutional: She is oriented to person, place, and time. She appears well-developed and well-nourished.  HENT:  Head: Normocephalic and atraumatic.  Eyes: Conjunctivae are normal. Pupils are equal, round, and reactive to light.  Neck: Normal range of motion. Neck supple.  Cardiovascular: Normal rate and regular rhythm.   Respiratory: Effort normal.  GI: Soft.  Musculoskeletal: Normal range of motion.  Neurological: She is alert and oriented to person, place, and time.  Skin: Skin is warm and dry.  Psychiatric: She has a normal mood and affect. Her behavior is normal. Judgment and thought content normal.    Prenatal labs: ABO, Rh: O/POS/-- (07/28 1111) Antibody: NEG (07/28 1111) Rubella: 3.56 (07/28 1111) RPR: NON REAC (09/12 1013)  HBsAg: NEGATIVE (07/28 1111)  HIV: NON REACTIVE (09/12 1013)  GBS: POSITIVE (12/23 1500)   Assessment/Plan: 39.2 weeks.  Active labor.  Admit.   Kham Zuckerman A 02/03/2013, 12:14 AM

## 2013-02-03 NOTE — Lactation Note (Signed)
This note was copied from the chart of Victoria Robinson Hockman. Lactation Consultation Note initial visit at 16 hours of age.  [facility] given and discussed.  RN reports baby latched to left breast with #24 nipple shield for a few minutes and then to sleep, but now showing feeding cues.  Mom reports difficulty with left nipple.  She pumped for a few months with an older child, previous baby didn't latch much.  Lab to see baby and baby skin to skin with mom.  Baby vigorously rooting and snorting on chest.  Baby moved self to right breast and self latched eagerly.  Somewhat shallow latch with minimal breast compression baby latched deeply with vigorous sucks for about 3 minutes and then crying with heal stick. After lab procedure assisted with nipple shield to latch on left breast.  Minimal assist needed, baby latches well with short suckling burst. No audible swallows and hand expression did not yield any colostrum.  Encouraged mom to try hand expression, feed with cues skin to skin and frequency of newborn feedings discussed. [facility].  Mom to call for assist as needed. Encouraged to attempt without nipple shield at next feeding.   Patient Name: Victoria Robinson Madaris ZOXWR'UToday's Date: 02/03/2013 Reason for consult: Initial assessment;Difficult latch   Maternal Data Formula Feeding for Exclusion: Yes Reason for exclusion: Mother's choice to formula and breast feed on admission Has patient been taught Hand Expression?: Yes Does the patient have breastfeeding experience prior to this delivery?: Yes  Feeding Feeding Type: Breast Fed Length of feed:  (4 mnutes observed)  LATCH Score/Interventions Latch: Grasps breast easily, tongue down, lips flanged, rhythmical sucking. Intervention(s): Teach feeding cues;Skin to skin Intervention(s): Adjust position;Assist with latch;Breast compression  Audible Swallowing: None Intervention(s): Hand expression;Skin to skin  Type of Nipple:  Flat Intervention(s): Reverse pressure;Hand pump  Comfort (Breast/Nipple): Soft / non-tender     Hold (Positioning): No assistance needed to correctly position infant at breast. Intervention(s): Breastfeeding basics reviewed;Support Pillows;Position options;Skin to skin  LATCH Score: 7  Lactation Tools Discussed/Used Tools: Nipple Shields Nipple shield size: 24   Consult Status Consult Status: Follow-up Date: 02/04/13 Follow-up type: In-patient    Shoptaw, Arvella MerlesJana Lynn 02/03/2013, 9:11 PM

## 2013-02-03 NOTE — Anesthesia Procedure Notes (Signed)
Epidural Patient location during procedure: OB  Staffing Anesthesiologist: Rece Zechman Performed by: anesthesiologist   Preanesthetic Checklist Completed: patient identified, site marked, surgical consent, pre-op evaluation, timeout performed, IV checked, risks and benefits discussed and monitors and equipment checked  Epidural Patient position: sitting Prep: ChloraPrep Patient monitoring: heart rate, continuous pulse ox and blood pressure Approach: right paramedian Injection technique: LOR saline  Needle:  Needle type: Tuohy  Needle gauge: 17 G Needle length: 9 cm and 9 Needle insertion depth: 7 cm Catheter type: closed end flexible Catheter size: 20 Guage Catheter at skin depth: 12 cm Test dose: negative  Assessment Events: blood not aspirated, injection not painful, no injection resistance, negative IV test and no paresthesia  Additional Notes   Patient tolerated the insertion well without complications.   

## 2013-02-04 LAB — CBC
HCT: 34 % — ABNORMAL LOW (ref 36.0–46.0)
Hemoglobin: 11.3 g/dL — ABNORMAL LOW (ref 12.0–15.0)
MCH: 30.5 pg (ref 26.0–34.0)
MCHC: 33.2 g/dL (ref 30.0–36.0)
MCV: 91.6 fL (ref 78.0–100.0)
PLATELETS: 223 10*3/uL (ref 150–400)
RBC: 3.71 MIL/uL — AB (ref 3.87–5.11)
RDW: 13.7 % (ref 11.5–15.5)
WBC: 10.6 10*3/uL — ABNORMAL HIGH (ref 4.0–10.5)

## 2013-02-04 NOTE — Progress Notes (Signed)
UR chart review completed.  
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
## 2013-02-04 NOTE — Progress Notes (Signed)
Patient ID: Victoria CorneliaDonnika L Tu, female   DOB: 1985-09-12, 28 y.o.   MRN: [id number] Postpartum day one Vital signs normal Fundus firm Doing well and

## 2013-02-04 NOTE — Lactation Note (Signed)
This note was copied from the chart of Victoria Robinson Wilk. Lactation Consultation Note  Patient Name: Victoria Robinson Pribyl ZOXWR'UToday's Date: 02/04/2013 Reason for consult: Follow-up assessment;Difficult latch Mom reports baby is not latching well to left breast. She is wearing her shells. On exam, both nipple and aerola are soft, compressible. Assisted Mom with latching baby to left breast in football hold. After several attempts using breast compression, sandwiching the nipple baby latched demonstrating a good rhythmic suck. Mom has colostrum present with hand expression. Baby became sleepy after about 7 minutes, Mom undressed baby and latched to right breast in football hold. Due to inconsistent latch, set Mom up with DEBP and encouraged to post pump on preemie setting for 15 minutes every 3 hours. Encouraged to give baby back any amount of EBM Mom receives. Mom will continue to supplement after BF till baby is consistently latching and bili levels are stable. Encouraged Mom to keep baby actively nursing for 15-20 minutes, both breasts when possible. [facility] and can pick up a DEBP on Monday if needed. Advised to call for assist as needed.   Maternal Data    Feeding Feeding Type: Breast Fed  LATCH Score/Interventions Latch: Repeated attempts needed to sustain latch, nipple held in mouth throughout feeding, stimulation needed to elicit sucking reflex. Intervention(s): Adjust position;Assist with latch;Breast massage;Breast compression  Audible Swallowing: A few with stimulation  Type of Nipple: Everted at rest and after stimulation Intervention(s): Shells;Hand pump;Double electric pump  Comfort (Breast/Nipple): Soft / non-tender     Hold (Positioning): Assistance needed to correctly position infant at breast and maintain latch.  LATCH Score: 7  Lactation Tools Discussed/Used Tools: Shells;Nipple Dorris CarnesShields;Pump Nipple shield size: 24 Shell Type: Inverted Breast pump type:  Double-Electric Breast Pump WIC Program: Yes   Consult Status Consult Status: Follow-up Date: 02/05/13 Follow-up type: In-patient    Alfred LevinsGranger, Finleigh Cheong Ann 02/04/2013, 4:14 PM

## 2013-02-04 NOTE — Anesthesia Postprocedure Evaluation (Signed)
  Anesthesia Post-op Note  Patient: Victoria Robinson  Procedure(s) Performed: * No procedures listed *  Patient Location: Mother/Baby  Anesthesia Type:Epidural  Level of Consciousness: awake and alert   Airway and Oxygen Therapy: Patient Spontanous Breathing  Post-op Pain: mild  Post-op Assessment: Patient's Cardiovascular Status Stable, Respiratory Function Stable, Patent Airway, No signs of Nausea or vomiting, Pain level controlled, No headache, No residual numbness and No residual motor weakness  Post-op Vital Signs: stable  Complications: No apparent anesthesia complications

## 2013-02-05 NOTE — Discharge Summary (Signed)
Obstetric Discharge Summary Reason for Admission: onset of labor Prenatal Procedures: none Intrapartum Procedures: spontaneous vaginal delivery Postpartum Procedures: none Complications-Operative and Postpartum: none Hemoglobin  Date Value Range Status  02/04/2013 11.3* 12.0 - 15.0 g/dL Final     HCT  Date Value Range Status  02/04/2013 34.0* 36.0 - 46.0 % Final    Physical Exam:  General: alert Lochia: appropriate Uterine Fundus: firm Incision: healing well DVT Evaluation: No evidence of DVT seen on physical exam.  Discharge Diagnoses: Term Pregnancy-delivered  Discharge Information: Date: 02/05/2013 Activity: pelvic rest Diet: routine Medications: Percocet Condition: improved Instructions: refer to practice specific booklet Discharge to: home Follow-up Information   Schedule an appointment as soon as possible for a visit with HARPER,CHARLES A, MD.   Specialty:  Obstetrics and Gynecology   Contact information:   [address] [place] [address] [phone]       Newborn Data: Live born female  Birth Weight: 8 lb 2 oz (3685 g) APGAR: 9, 9  Home with mother.  MARSHALL,BERNARD A 02/05/2013, 6:50 AM

## 2013-02-05 NOTE — Discharge Instructions (Signed)
Discharge instructions   You can wash your hair  Shower  Eat what you want  Drink what you want  See me in 6 weeks  Your ankles are going to swell more in the next 2 weeks than when pregnant  No sex for 6 weeks   [name] A, MD 02/05/2013

## 2013-02-08 ENCOUNTER — Encounter: Payer: Medicaid Other | Admitting: Advanced Practice Midwife

## 2013-03-15 ENCOUNTER — Encounter: Payer: Self-pay | Admitting: Advanced Practice Midwife

## 2013-03-15 ENCOUNTER — Ambulatory Visit (INDEPENDENT_AMBULATORY_CARE_PROVIDER_SITE_OTHER): Payer: Medicaid Other | Admitting: Advanced Practice Midwife

## 2013-03-15 VITALS — BP 129/89 | HR 72 | Temp 98.4°F | Ht 61.0 in | Wt 238.0 lb

## 2013-03-15 DIAGNOSIS — N760 Acute vaginitis: Secondary | ICD-10-CM

## 2013-03-15 DIAGNOSIS — B9689 Other specified bacterial agents as the cause of diseases classified elsewhere: Secondary | ICD-10-CM

## 2013-03-15 DIAGNOSIS — N898 Other specified noninflammatory disorders of vagina: Secondary | ICD-10-CM

## 2013-03-15 DIAGNOSIS — IMO0001 Reserved for inherently not codable concepts without codable children: Secondary | ICD-10-CM

## 2013-03-15 LAB — POCT URINE PREGNANCY: Preg Test, Ur: NEGATIVE

## 2013-03-15 MED ORDER — NORETHINDRONE 0.35 MG PO TABS: 1.0000 | ORAL_TABLET | Freq: Every day | ORAL | Status: DC

## 2013-03-15 MED ORDER — METRONIDAZOLE 0.75 % VA GEL: 1.0000 | Freq: Every day | VAGINAL | Status: AC

## 2013-03-15 NOTE — Progress Notes (Signed)
Subjective:     Victoria Robinson is a 28 y.o. female who presents for a postpartum visit. She is 6 weeks postpartum following a spontaneous vaginal delivery. I have fully reviewed the prenatal and intrapartum course. The delivery was at 39 gestational weeks. Outcome: spontaneous vaginal delivery. Anesthesia: epidural. Postpartum course has been WNL. Patient states she has been having lower back pain. Patient states she has a thick yellow discharge. Patient states she has a slight vaginal odor. Patient denies any itching, irritation, or burning. Patient would like to talk about birth control. Patient states she would like to know if she can get a clearance back to work.  Baby's course has been WNL. Baby is feeding by breast. Bleeding no bleeding. Bowel function is normal. Bladder function is normal. Patient is not sexually active. Contraception method is abstinence. Postpartum depression screening: positive, some mild depression. Patient pumping and giving baby expressed milk. Patient denies need for counseling or medication for depression. Denies suicidal ideations. Reports some days just get a little overwhelming. Reports + bonding w/ baby.  The following portions of the patient's history were reviewed and updated as appropriate: allergies, current medications, past family history, past medical history, past social history, past surgical history and problem list.  Review of Systems Pertinent items are noted in HPI.   Objective:    BP 129/89  Pulse 72  Temp(Src) 98.4 F (36.9 C)  Ht 5\' 1"  (1.549 m)  Wt 238 lb (107.956 kg)  BMI 44.99 kg/m2  Breastfeeding? Yes  General:  alert and cooperative   Breasts:  inspection negative, no nipple discharge or bleeding, no masses or nodularity palpable            Vulva:  normal  Vagina: normal vagina  Cervix:  no cervical motion tenderness  Corpus: not examined  Adnexa:  normal adnexa  Rectal Exam: Not performed.       Yellow cervical discharge  noted Negative pregnancy test Assessment:     6 postpartum exam. Pap smear not done at today's visit.  +Bacterial Vaginosis Micronor Breast Feeding Mild PP depression   Plan:    1. Contraception: oral progesterone-only contraceptive 2. Reviewed birth control options. Gave Mirena and Nexplanon info. Patient will decide and return.  3. Follow up in: PRN . or as needed.  4. GC/CT pending 5. Patient declines intervention for PP depression. Cont to evaluate. Reviewed options and precautions w/ patient today. 5. BV, metrogel Rx  25 min spent with patient greater than 80% spent in counseling and coordination of care.   Ankur Snowdon Wilson SingerWren CNM

## 2013-03-16 LAB — WET PREP BY MOLECULAR PROBE
Candida species: NEGATIVE
GARDNERELLA VAGINALIS: POSITIVE — AB
Trichomonas vaginosis: NEGATIVE

## 2013-03-16 LAB — GC/CHLAMYDIA PROBE AMP
CT Probe RNA: NEGATIVE
GC Probe RNA: NEGATIVE

## 2013-03-17 ENCOUNTER — Encounter: Payer: Self-pay | Admitting: *Deleted

## 2013-03-29 ENCOUNTER — Ambulatory Visit: Payer: Medicaid Other | Admitting: Advanced Practice Midwife

## 2013-04-01 ENCOUNTER — Ambulatory Visit: Payer: Medicaid Other | Admitting: Obstetrics & Gynecology

## 2013-04-04 ENCOUNTER — Encounter: Payer: Self-pay | Admitting: Obstetrics & Gynecology

## 2013-04-04 ENCOUNTER — Ambulatory Visit (INDEPENDENT_AMBULATORY_CARE_PROVIDER_SITE_OTHER): Payer: Medicaid Other | Admitting: Obstetrics & Gynecology

## 2013-04-04 VITALS — BP 125/84 | HR 78 | Temp 98.5°F | Ht 61.0 in | Wt 236.0 lb

## 2013-04-04 DIAGNOSIS — IMO0001 Reserved for inherently not codable concepts without codable children: Secondary | ICD-10-CM

## 2013-04-04 DIAGNOSIS — Z309 Encounter for contraceptive management, unspecified: Secondary | ICD-10-CM

## 2013-04-04 DIAGNOSIS — Z30430 Encounter for insertion of intrauterine contraceptive device: Secondary | ICD-10-CM

## 2013-04-04 MED ORDER — LEVONORGESTREL 20 MCG/24HR IU IUD
INTRAUTERINE_SYSTEM | Freq: Once | INTRAUTERINE | Status: AC
  Administered 2013-04-04: 14:00:00 via INTRAUTERINE

## 2013-04-04 NOTE — Progress Notes (Signed)
IUD Insertion Procedure Note  Pre-operative Diagnosis: Desires contraception  Post-operative Diagnosis: same  Indications: contraception  Procedure Details  Urine pregnancy test was done Today and result was Negative.  The risks (including infection, bleeding, pain, and uterine perforation) and benefits of the procedure were explained to the patient and Written informed consent was obtained.  Patient states she is currently sexually active but used a condom and has not missed any birth control pills.   Cervix cleansed with Betadine. Uterus sounded to 8 cm. IUD inserted without difficulty. String visible and trimmed. Patient tolerated procedure well.  IUD Information: Mirena, Lot # [id number], Expiration date 09/2015  Condition: Stable  Complications: None  Plan:  The patient was advised to call for any fever or for prolonged or severe pain or bleeding. She was advised to use OTC analgesics as needed for mild to moderate pain.

## 2013-04-04 NOTE — Patient Instructions (Signed)

## 2013-04-06 ENCOUNTER — Ambulatory Visit: Payer: Medicaid Other | Admitting: Obstetrics & Gynecology

## 2013-04-09 ENCOUNTER — Encounter (HOSPITAL_COMMUNITY): Payer: Self-pay | Admitting: Emergency Medicine

## 2013-04-09 ENCOUNTER — Emergency Department (HOSPITAL_COMMUNITY)
Admission: EM | Admit: 2013-04-09 | Discharge: 2013-04-09 | Disposition: A | Discharge status: Home / Self Care | Payer: Medicaid Other | Attending: Emergency Medicine | Admitting: Emergency Medicine

## 2013-04-09 ENCOUNTER — Emergency Department (HOSPITAL_COMMUNITY): Payer: Medicaid Other

## 2013-04-09 DIAGNOSIS — J45909 Unspecified asthma, uncomplicated: Secondary | ICD-10-CM | POA: Insufficient documentation

## 2013-04-09 DIAGNOSIS — B9789 Other viral agents as the cause of diseases classified elsewhere: Secondary | ICD-10-CM

## 2013-04-09 DIAGNOSIS — Z79899 Other long term (current) drug therapy: Secondary | ICD-10-CM | POA: Insufficient documentation

## 2013-04-09 DIAGNOSIS — J069 Acute upper respiratory infection, unspecified: Secondary | ICD-10-CM | POA: Insufficient documentation

## 2013-04-09 DIAGNOSIS — Z87891 Personal history of nicotine dependence: Secondary | ICD-10-CM | POA: Insufficient documentation

## 2013-04-09 NOTE — ED Provider Notes (Signed)
Medical screening examination/treatment/procedure(s) were performed by non-physician practitioner and as supervising physician I was immediately available for consultation/collaboration.   EKG Interpretation None        Zakeria Kulzer, MD 04/09/13 1527 

## 2013-04-09 NOTE — ED Notes (Signed)
Pt left prior to receiving discharge papers and final set of vitals. Pt alert and ambulatory with no acute distress.

## 2013-04-09 NOTE — ED Provider Notes (Signed)
CSN: [id number]     Arrival date & time 04/09/13  1228 History  This chart was scribed for non-physician practitioner working with Rolan Bucco, MD by Ashley Jacobs, ED scribe. This patient was seen in room WTR7/WTR7 and the patient's care was started at 1:11 PM.  First MD Initiated Contact with Patient 04/09/13 1300     Chief Complaint  Patient presents with  . Fever  . Cough     (Consider location/radiation/quality/duration/timing/severity/associated sxs/prior Treatment) The history is provided by the patient and medical records.   HPI Comments: Victoria Robinson is a 28 y.o. female who presents to the Emergency Department complaining of fever and cough for the past four days. Pt has the associated symptoms of cold chills, fever (105F) nasal congestion, and left sided intercostal pain with coughs. Pt had sick contact with her child who is congested with sinus infection. Pt has a past medical hx of asthma but denies having any recent related complications. Denies chest pain or SOB.  Pt did not receive a flu shot this year.  Pt has been taking tylenol and motrin for fever, last dose taken 30 mins PTA.  Past Medical History  Diagnosis Date  . Shortness of breath   . Abnormal Pap smear     repeat WNL  . Asthma childhood   Past Surgical History  Procedure Laterality Date  . Appendectomy     Family History  Problem Relation Age of Onset  . Hyperlipidemia Mother   . Hyperlipidemia Father   . Hypertension Father   . Diabetes Maternal Grandmother   . Diabetes Paternal Grandmother    History  Substance Use Topics  . Smoking status: Former Smoker    Quit date: 09/17/2012  . Smokeless tobacco: Never Used  . Alcohol Use: No   OB History   Grav Para Term Preterm Abortions TAB SAB Ect Mult Living   3 2 2  1  1   2      Review of Systems  Constitutional: Positive for fever and chills.  HENT: Positive for congestion.   Respiratory: Positive for cough.   All other systems  reviewed and are negative.      Allergies  Review of patient's allergies indicates no known allergies.  Home Medications   Current Outpatient Rx  Name  Route  Sig  Dispense  Refill  . acetaminophen (TYLENOL) 500 MG tablet   Oral   Take 1,000 mg by mouth every 6 (six) hours as needed for fever.         Marland Kitchen albuterol (PROVENTIL HFA;VENTOLIN HFA) 108 (90 BASE) MCG/ACT inhaler   Inhalation   Inhale into the lungs every 6 (six) hours as needed for wheezing or shortness of breath.         . diphenhydrAMINE (BENADRYL) 25 MG tablet   Oral   Take 25 mg by mouth every 6 (six) hours as needed for allergies.         Marland Kitchen levonorgestrel (MIRENA) 20 MCG/24HR IUD   Intrauterine   1 each by Intrauterine route once.         . Prenatal Vit-Fe Fumarate-FA (PRENATAL MULTIVITAMIN) TABS   Oral   Take 1 tablet by mouth daily.          BP 125/70  Pulse 106  Temp(Src) 98.7 F (37.1 C) (Oral)  Resp 17  SpO2 98%  LMP 03/24/2013  Physical Exam  Nursing note and vitals reviewed. Constitutional: She is oriented to person, place, and time. She appears well-developed  and well-nourished. No distress.  HENT:  Head: Normocephalic and atraumatic.  Right Ear: Tympanic membrane and ear canal normal.  Left Ear: Tympanic membrane and ear canal normal.  Nose: Mucosal edema present.  Mouth/Throat: Uvula is midline, oropharynx is clear and moist and mucous membranes are normal. No oropharyngeal exudate, posterior oropharyngeal edema, posterior oropharyngeal erythema or tonsillar abscesses.  Eyes: Conjunctivae and EOM are normal. Pupils are equal, round, and reactive to light.  Neck: Normal range of motion. Neck supple.  Cardiovascular: Normal rate, regular rhythm and normal heart sounds.   Pulmonary/Chest: Effort normal and breath sounds normal. No respiratory distress. She has no wheezes. She has no rales.  Musculoskeletal: Normal range of motion. She exhibits no edema.  Neurological: She is alert  and oriented to person, place, and time.  Skin: Skin is warm and dry. She is not diaphoretic.  Psychiatric: She has a normal mood and affect.    ED Course  Procedures (including critical care time) DIAGNOSTIC STUDIES: Oxygen Saturation is 98% on room air, normal by my interpretation.    COORDINATION OF CARE:  1:14 PM Discussed course of care with pt chest x-ray. Pt understands and agrees.   Labs Review Labs Reviewed - No data to display Imaging Review Dg Chest 2 View  04/09/2013   CLINICAL DATA:  Asthma with fever and cough.  EXAM: CHEST  2 VIEW  COMPARISON:  02/17/2009  FINDINGS: Again noted are calcifications in the mediastinum and right hilum. There is a calcified granuloma in the right upper lung. Calcified granuloma at the right lung base. No evidence for airspace disease or edema. Heart size is normal. Bony thorax is intact.  IMPRESSION: No acute cardiopulmonary disease.  Old granulomatous disease.   Electronically Signed   By: Richarda OverlieAdam  Henn M.D.   On: 04/09/2013 14:22     EKG Interpretation None      MDM   Final diagnoses:  None   CXR negative.  Constellations of sx likely viral in nature.  Pt afebrile, non-toxic appearing, NAD, VS stable- ok for discharge.  Instructed on supportive care at home including cough meds, decongestants, rest, and fluids.  Pt acknowledged understanding but left prior to receiving discharge paperwork.   I personally performed the services described in this documentation, which was scribed in my presence. The recorded information has been reviewed and is accurate.  Garlon HatchetLisa M Aviana Shevlin, PA-C 04/09/13 1520

## 2013-04-09 NOTE — ED Notes (Signed)
Pt c/o fever x 4 days w/ cough.

## 2013-04-09 NOTE — Discharge Instructions (Signed)
May use over the counter cough medicines and decongestants as needed. Rest and drink plenty of fluids.  Continue tylenol or motrin as needed for fever. Return to the ED for new or worsening symptoms.

## 2013-04-12 ENCOUNTER — Encounter: Payer: Self-pay | Admitting: Obstetrics & Gynecology

## 2013-05-16 ENCOUNTER — Ambulatory Visit (INDEPENDENT_AMBULATORY_CARE_PROVIDER_SITE_OTHER): Payer: Medicaid Other | Admitting: Obstetrics & Gynecology

## 2013-05-16 ENCOUNTER — Encounter: Payer: Self-pay | Admitting: Obstetrics & Gynecology

## 2013-05-16 VITALS — BP 121/80 | HR 80 | Temp 98.2°F | Ht 61.0 in | Wt 231.0 lb

## 2013-05-16 DIAGNOSIS — N949 Unspecified condition associated with female genital organs and menstrual cycle: Secondary | ICD-10-CM

## 2013-05-16 DIAGNOSIS — R102 Pelvic and perineal pain: Secondary | ICD-10-CM

## 2013-05-16 DIAGNOSIS — R109 Unspecified abdominal pain: Secondary | ICD-10-CM

## 2013-05-16 LAB — POCT URINE PREGNANCY: PREG TEST UR: NEGATIVE

## 2013-05-16 NOTE — Progress Notes (Signed)
Subjective:     Victoria Robinson is a 28 y.o. female here for a routine exam.  Current complaints: Patient is in the office today for a follow up visit after Mirena Insertion. Patient states she has been spotting off and on since the Insertion. Patient states sometimes she has bad cramping. Patient states she has back pain around the area she had the epidural. Patient states she would like to know if there was a recall on the Mirena. The HPI was reviewed and explored in further detail by the provider. Gynecologic History Patient is Unsure of last LPM because she has been spotting since Mirena Insertion. Contraception: IUD Last Pap: 08/26/2012. Results were: abnormal- ASC-US  Obstetric History OB History  Gravida Para Term Preterm AB SAB TAB Ectopic Multiple Living  3 2 2  1 1    2     # Outcome Date GA Lbr Len/2nd Weight Sex Delivery Anes PTL Lv  3 TRM 02/03/13 [id number] 09:27 / 00:21 8 lb 2 oz (3.685 kg) M SVD EPI  Y  2 SAB 2014          1 TRM 2010 [id number]   M SVD EPI N Y       The following portions of the patient's history were reviewed and updated as appropriate: allergies, current medications, past family history, past medical history, past social history, past surgical history and problem list.  Review of Systems Gastrointestinal: positive for abdominal pain Genitourinary:positive for abnormal menstrual periods    Objective:   General:   alert  Skin:   no rash or abnormalities  Lungs:   clear to auscultation bilaterally  Heart:   regular rate and rhythm, S1, S2 normal, no murmur, click, rub or gallop  Breasts:   normal without suspicious masses, skin or nipple changes or axillary nodes  Abdomen:  normal findings: no organomegaly, soft, non-tender and no hernia  Pelvis:  External genitalia: normal general appearance Urinary system: urethral meatus normal and bladder without fullness, nontender Vaginal: normal without tenderness, induration or masses Cervix: normal appearance;  IUD strings present Adnexa: normal bimanual exam Uterus: anteverted and non-tender, normal size    50% of 15 min visit spent on counseling and coordination of care.  3-D TVS reviewed-- IUD w/o myometrial penetration Assessment:   IUD in appropriate position ?etiology of complaints Unscheduled bleeding--reassured  Plan:    Keep track of symptoms Return in a few months or if symptoms worsen

## 2013-05-17 ENCOUNTER — Encounter: Payer: Self-pay | Admitting: Obstetrics & Gynecology

## 2013-05-17 LAB — GC/CHLAMYDIA PROBE AMP
CT Probe RNA: NEGATIVE
GC Probe RNA: NEGATIVE

## 2013-11-18 ENCOUNTER — Other Ambulatory Visit: Payer: Self-pay

## 2013-12-05 ENCOUNTER — Encounter: Payer: Self-pay | Admitting: Obstetrics & Gynecology

## 2014-01-30 ENCOUNTER — Encounter: Payer: Self-pay | Admitting: *Deleted

## 2014-01-31 ENCOUNTER — Encounter: Payer: Self-pay | Admitting: Obstetrics & Gynecology

## 2014-02-13 ENCOUNTER — Other Ambulatory Visit (HOSPITAL_COMMUNITY)
Admission: RE | Admit: 2014-02-13 | Discharge: 2014-02-13 | Disposition: A | Discharge status: Home / Self Care | Payer: PRIVATE HEALTH INSURANCE | Source: Ambulatory Visit | Attending: Family Medicine | Admitting: Family Medicine

## 2014-02-13 ENCOUNTER — Encounter (HOSPITAL_COMMUNITY): Payer: Self-pay | Admitting: Emergency Medicine

## 2014-02-13 ENCOUNTER — Emergency Department (HOSPITAL_COMMUNITY)
Admission: EM | Admit: 2014-02-13 | Discharge: 2014-02-13 | Disposition: A | Discharge status: Home / Self Care | Payer: PRIVATE HEALTH INSURANCE | Source: Home / Self Care | Attending: Family Medicine | Admitting: Family Medicine

## 2014-02-13 DIAGNOSIS — N760 Acute vaginitis: Secondary | ICD-10-CM | POA: Insufficient documentation

## 2014-02-13 DIAGNOSIS — Z113 Encounter for screening for infections with a predominantly sexual mode of transmission: Secondary | ICD-10-CM | POA: Insufficient documentation

## 2014-02-13 LAB — POCT URINALYSIS DIP (DEVICE)
BILIRUBIN URINE: NEGATIVE
Glucose, UA: NEGATIVE mg/dL
Ketones, ur: NEGATIVE mg/dL
Leukocytes, UA: NEGATIVE
Nitrite: NEGATIVE
PH: 5.5 (ref 5.0–8.0)
PROTEIN: NEGATIVE mg/dL
Specific Gravity, Urine: 1.025 (ref 1.005–1.030)
UROBILINOGEN UA: 0.2 mg/dL (ref 0.0–1.0)

## 2014-02-13 LAB — POCT PREGNANCY, URINE: Preg Test, Ur: NEGATIVE

## 2014-02-13 MED ORDER — METRONIDAZOLE 500 MG PO TABS: 500.0000 mg | ORAL_TABLET | Freq: Two times a day (BID) | ORAL | Status: DC

## 2014-02-13 NOTE — ED Notes (Addendum)
Pt states she is having LUQ pain along with right back pain for the last 3-4 weeks. She states she has to urinate frequently and the urine is dark and with odor.  Pt denies any discharge.  Pt also states she gets RLQ pain when she bends over and she believes it is from her IUD shifting.  Pt plans to make an appointment with her OBGYN to address this situation.

## 2014-02-13 NOTE — Discharge Instructions (Signed)
Thank you for coming in today. If your belly pain worsens, or you have high fever, bad vomiting, blood in your stool or black tarry stool go to the Emergency Room.  Do not take flagyl and drink alcohol.  Follow up with your OBGYN doctor.   Bacterial Vaginosis Bacterial vaginosis is a vaginal infection that occurs when the normal balance of bacteria in the vagina is disrupted. It results from an overgrowth of certain bacteria. This is the most common vaginal infection in women of childbearing age. Treatment is important to prevent complications, especially in pregnant women, as it can cause a premature delivery. CAUSES  Bacterial vaginosis is caused by an increase in harmful bacteria that are normally present in smaller amounts in the vagina. Several different kinds of bacteria can cause bacterial vaginosis. However, the reason that the condition develops is not fully understood. RISK FACTORS Certain activities or behaviors can put you at an increased risk of developing bacterial vaginosis, including:  Having a new sex partner or multiple sex partners.  Douching.  Using an intrauterine device (IUD) for contraception. Women do not get bacterial vaginosis from toilet seats, bedding, swimming pools, or contact with objects around them. SIGNS AND SYMPTOMS  Some women with bacterial vaginosis have no signs or symptoms. Common symptoms include:  Grey vaginal discharge.  A fishlike odor with discharge, especially after sexual intercourse.  Itching or burning of the vagina and vulva.  Burning or pain with urination. DIAGNOSIS  Your health care provider will take a medical history and examine the vagina for signs of bacterial vaginosis. A sample of vaginal fluid may be taken. Your health care provider will look at this sample under a microscope to check for bacteria and abnormal cells. A vaginal pH test may also be done.  TREATMENT  Bacterial vaginosis may be treated with antibiotic medicines.  These may be given in the form of a pill or a vaginal cream. A second round of antibiotics may be prescribed if the condition comes back after treatment.  HOME CARE INSTRUCTIONS   Only take over-the-counter or prescription medicines as directed by your health care provider.  If antibiotic medicine was prescribed, take it as directed. Make sure you finish it even if you start to feel better.  Do not have sex until treatment is completed.  Tell all sexual partners that you have a vaginal infection. They should see their health care provider and be treated if they have problems, such as a mild rash or itching.  Practice safe sex by using condoms and only having one sex partner. SEEK MEDICAL CARE IF:   Your symptoms are not improving after 3 days of treatment.  You have increased discharge or pain.  You have a fever. MAKE SURE YOU:   Understand these instructions.  Will watch your condition.  Will get help right away if you are not doing well or get worse. FOR MORE INFORMATION  Centers for Disease Control and Prevention, Division of STD Prevention: SolutionApps.co.zawww.cdc.gov/std American Sexual Health Association (ASHA): www.ashastd.org  Document Released: 01/20/2005 Document Revised: 11/10/2012 Document Reviewed: 09/01/2012 [facility] Patient Information 2015 [employer] [place]. This information is not intended to replace advice given to you by your health care provider. Make sure you discuss any questions you have with your health care provider.

## 2014-02-13 NOTE — ED Provider Notes (Signed)
Victoria Robinson is a 29 y.o. female who presents to Urgent Care today for flank and abdominal pain with urinary frequency. Symptoms present for about 3 weeks. No vaginal discharge. She notes mild right-sided pelvic pain. No fevers or chills. No diarrhea vomiting or constipation.   Past Medical History  Diagnosis Date  . Shortness of breath   . Abnormal Pap smear     repeat WNL  . Asthma childhood   Past Surgical History  Procedure Laterality Date  . Appendectomy     History  Substance Use Topics  . Smoking status: Former Smoker    Quit date: 09/17/2012  . Smokeless tobacco: Never Used  . Alcohol Use: Yes     Comment: occasionally    ROS as above Medications: No current facility-administered medications for this encounter.   Current Outpatient Prescriptions  Medication Sig Dispense Refill  . levonorgestrel (MIRENA) 20 MCG/24HR IUD 1 each by Intrauterine route once.    Marland Kitchen. acetaminophen (TYLENOL) 500 MG tablet Take 1,000 mg by mouth every 6 (six) hours as needed for fever.    Marland Kitchen. albuterol (PROVENTIL HFA;VENTOLIN HFA) 108 (90 BASE) MCG/ACT inhaler Inhale into the lungs every 6 (six) hours as needed for wheezing or shortness of breath.    . diphenhydrAMINE (BENADRYL) 25 MG tablet Take 25 mg by mouth every 6 (six) hours as needed for allergies.    . metroNIDAZOLE (FLAGYL) 500 MG tablet Take 1 tablet (500 mg total) by mouth 2 (two) times daily. 14 tablet 0   No Known Allergies   Exam:  BP 116/68 mmHg  Pulse 72  Temp(Src) 98 F (36.7 C) (Oral)  Resp 16  SpO2 100%  LMP 01/30/2014 (Exact Date) Gen: Well NAD HEENT: EOMI,  MMM Lungs: Normal work of breathing. CTABL Heart: RRR no MRG Abd: NABS, Soft. Nondistended, Nontender no CVA tenderness to percussion Exts: Brisk capillary refill, warm and well perfused.  GYN: External genitalia. Vaginal canal with thick white discharge. IUD strings are intact. Cervix is nontender. No masses palpated  Results for orders placed or  performed during the hospital encounter of 02/13/14 (from the past 24 hour(s))  POCT urinalysis dip (device)     Status: Abnormal   Collection Time: 02/13/14 12:10 PM  Result Value Ref Range   Glucose, UA NEGATIVE NEGATIVE mg/dL   Bilirubin Urine NEGATIVE NEGATIVE   Ketones, ur NEGATIVE NEGATIVE mg/dL   Specific Gravity, Urine 1.025 1.005 - 1.030   Hgb urine dipstick TRACE (A) NEGATIVE   pH 5.5 5.0 - 8.0   Protein, ur NEGATIVE NEGATIVE mg/dL   Urobilinogen, UA 0.2 0.0 - 1.0 mg/dL   Nitrite NEGATIVE NEGATIVE   Leukocytes, UA NEGATIVE NEGATIVE  Pregnancy, urine POC     Status: None   Collection Time: 02/13/14 12:17 PM  Result Value Ref Range   Preg Test, Ur NEGATIVE NEGATIVE   No results found.  Assessment and Plan: 29 y.o. female with vaginitis likely explanation for symptoms. Cytology pending for gonorrhea and chlamydia trichomonas BV and yeast. Treat with metronidazole. Urine culture pending. Follow-up with OB/GYN  Discussed warning signs or symptoms. Please see discharge instructions. Patient expresses understanding.     Rodolph BongEvan S Corey, MD 02/13/14 [phone]

## 2014-02-14 LAB — CERVICOVAGINAL ANCILLARY ONLY
CHLAMYDIA, DNA PROBE: NEGATIVE
NEISSERIA GONORRHEA: NEGATIVE
Wet Prep (BD Affirm): NEGATIVE
Wet Prep (BD Affirm): NEGATIVE
Wet Prep (BD Affirm): POSITIVE — AB

## 2014-02-14 LAB — URINE CULTURE
CULTURE: NO GROWTH
Colony Count: NO GROWTH
Special Requests: NORMAL

## 2014-02-14 NOTE — ED Notes (Signed)
GC/Chlamydia neg., Affirm: Candida and Trich neg., Gardnerella pos., Urine culture: No growth.  Pt. adequately treated with Flagyl. Vassie MoselleYork, Shail Urbas M 02/14/2014

## 2014-05-26 ENCOUNTER — Ambulatory Visit: Payer: Self-pay | Admitting: Certified Nurse Midwife

## 2014-05-31 ENCOUNTER — Ambulatory Visit: Payer: Self-pay | Admitting: Certified Nurse Midwife

## 2014-06-02 ENCOUNTER — Ambulatory Visit: Payer: Self-pay | Admitting: Certified Nurse Midwife

## 2014-09-14 ENCOUNTER — Telehealth: Payer: Self-pay | Admitting: *Deleted

## 2014-09-14 DIAGNOSIS — N760 Acute vaginitis: Principal | ICD-10-CM

## 2014-09-14 DIAGNOSIS — B9689 Other specified bacterial agents as the cause of diseases classified elsewhere: Secondary | ICD-10-CM

## 2014-09-14 MED ORDER — METRONIDAZOLE 500 MG PO TABS: 500.0000 mg | ORAL_TABLET | Freq: Two times a day (BID) | ORAL | Status: DC

## 2014-09-14 NOTE — Telephone Encounter (Signed)
Patient contacted the stating she is having a discharge with an odor. Patient denies any itching or burning. Per nursing protocol prescription sent to the pharmacy.  Patient also states she would like to have her Mirena IUD removed. Patient states her leg goes numb during intercourse and intercourse is uncomfortable. Patient has been scheduled for a birth control consult on 09/19/14.

## 2014-09-19 ENCOUNTER — Institutional Professional Consult (permissible substitution): Payer: Self-pay | Admitting: Certified Nurse Midwife

## 2015-06-01 ENCOUNTER — Ambulatory Visit: Payer: Medicaid Other | Admitting: Certified Nurse Midwife

## 2015-08-30 ENCOUNTER — Other Ambulatory Visit: Payer: Self-pay | Admitting: Obstetrics

## 2015-08-30 DIAGNOSIS — B9689 Other specified bacterial agents as the cause of diseases classified elsewhere: Secondary | ICD-10-CM

## 2015-08-30 DIAGNOSIS — N760 Acute vaginitis: Principal | ICD-10-CM

## 2015-09-26 ENCOUNTER — Emergency Department (HOSPITAL_COMMUNITY): Payer: BLUE CROSS/BLUE SHIELD

## 2015-09-26 ENCOUNTER — Emergency Department (HOSPITAL_COMMUNITY)
Admission: EM | Admit: 2015-09-26 | Discharge: 2015-09-26 | Disposition: A | Discharge status: Home / Self Care | Payer: BLUE CROSS/BLUE SHIELD | Attending: Emergency Medicine | Admitting: Emergency Medicine

## 2015-09-26 DIAGNOSIS — Z79899 Other long term (current) drug therapy: Secondary | ICD-10-CM | POA: Diagnosis not present

## 2015-09-26 DIAGNOSIS — J45909 Unspecified asthma, uncomplicated: Secondary | ICD-10-CM | POA: Diagnosis not present

## 2015-09-26 DIAGNOSIS — R519 Headache, unspecified: Secondary | ICD-10-CM

## 2015-09-26 DIAGNOSIS — R51 Headache: Secondary | ICD-10-CM | POA: Insufficient documentation

## 2015-09-26 LAB — I-STAT CHEM 8, ED
BUN: 7 mg/dL (ref 6–20)
Calcium, Ion: 1.14 mmol/L (ref 1.13–1.30)
Chloride: 101 mmol/L (ref 101–111)
Creatinine, Ser: 0.8 mg/dL (ref 0.44–1.00)
Glucose, Bld: 85 mg/dL (ref 65–99)
HCT: 41 % (ref 36.0–46.0)
Hemoglobin: 13.9 g/dL (ref 12.0–15.0)
Potassium: 5.1 mmol/L (ref 3.5–5.1)
Sodium: 140 mmol/L (ref 135–145)
TCO2: 32 mmol/L (ref 0–100)

## 2015-09-26 LAB — URINALYSIS, ROUTINE W REFLEX MICROSCOPIC
Bilirubin Urine: NEGATIVE
Glucose, UA: NEGATIVE mg/dL
Hgb urine dipstick: NEGATIVE
Ketones, ur: NEGATIVE mg/dL
Leukocytes, UA: NEGATIVE
Nitrite: NEGATIVE
Protein, ur: NEGATIVE mg/dL
Specific Gravity, Urine: 1.012 (ref 1.005–1.030)
pH: 6 (ref 5.0–8.0)

## 2015-09-26 LAB — PREGNANCY, URINE: Preg Test, Ur: NEGATIVE

## 2015-09-26 MED ORDER — KETOROLAC TROMETHAMINE 30 MG/ML IJ SOLN
30.0000 mg | Freq: Once | INTRAMUSCULAR | Status: AC
  Administered 2015-09-26: 30 mg via INTRAVENOUS
  Filled 2015-09-26: qty 1

## 2015-09-26 MED ORDER — IBUPROFEN 800 MG PO TABS: 800.0000 mg | ORAL_TABLET | Freq: Three times a day (TID) | ORAL | 0 refills | Status: DC | PRN

## 2015-09-26 MED ORDER — SODIUM CHLORIDE 0.9 % IV BOLUS (SEPSIS)
1000.0000 mL | Freq: Once | INTRAVENOUS | Status: AC
  Administered 2015-09-26: 1000 mL via INTRAVENOUS

## 2015-09-26 NOTE — ED Provider Notes (Signed)
[facility]-EMERGENCY DEPT Provider Note   CSN: [id number] Arrival date & time: 09/26/15  1506     History   Chief Complaint Chief Complaint  Patient presents with  . Headache    HPI Victoria Robinson is a 30 y.o. female who presents with a five-day history of headache. She describes her headache as left frontal/temporal and throbbing. Patient states it began suddenly on Saturday and became worse on Sunday. Patient has had constant pain since. She reports that her pain has radiated to the front of her neck. Patient has been taking Tylenol and other over-the-counter pain medications without relief. Patient also reports that she has had intermittent "few second" episodes of numbness of her tongue and slurring of her speech for the past couple months. The last episode was one week ago. Patient reports that she has been trying to see her primary care provider about this and requests imaging here. Patient also reports a 2-3 week history of intermittent bilateral low back pain and urinary frequency. She denies back pain today. Patient has a history of pyelonephritis and patient took 4 Cipro 500 mg at home with some relief, however it has returned. Patient denies any fevers, chest pain, shortness of breath, photophobia, abdominal pain, nausea, vomiting, dysuria.  HPI  Past Medical History:  Diagnosis Date  . Abnormal Pap smear    repeat WNL  . Asthma childhood  . Shortness of breath     Patient Active Problem List   Diagnosis Date Noted  . Obesity 12/27/2012    Past Surgical History:  Procedure Laterality Date  . APPENDECTOMY      OB History    Gravida Para Term Preterm AB Living   3 2 2   1 2    SAB TAB Ectopic Multiple Live Births   1       2       Home Medications    Prior to Admission medications   Medication Sig Start Date End Date Taking? Authorizing Provider  acetaminophen (TYLENOL) 500 MG tablet Take 1,000 mg by mouth every 6 (six) hours as needed for fever.   Yes  Historical Provider, MD  diphenhydrAMINE (BENADRYL) 25 MG tablet Take 25 mg by mouth every 6 (six) hours as needed for allergies.   Yes Historical Provider, MD  ergocalciferol (VITAMIN D2) 50000 units capsule Take 50,000 Units by mouth 2 (two) times a week.   Yes Historical Provider, MD  phentermine 37.5 MG capsule Take 37.5 mg by mouth every morning.   Yes Historical Provider, MD  ibuprofen (ADVIL,MOTRIN) 800 MG tablet Take 1 tablet (800 mg total) by mouth every 8 (eight) hours as needed. 09/26/15   Emi HolesAlexandra M Jaisha Villacres, PA-C  metroNIDAZOLE (FLAGYL) 500 MG tablet TAKE 1 TABLET(500 MG) BY MOUTH TWICE DAILY Patient not taking: Reported on 09/26/2015 08/31/15   Brock Badharles A Harper, MD    Family History Family History  Problem Relation Age of Onset  . Hyperlipidemia Mother   . Hyperlipidemia Father   . Hypertension Father   . Diabetes Maternal Grandmother   . Diabetes Paternal Grandmother     Social History Social History  Substance Use Topics  . Smoking status: Former Smoker    Quit date: 09/17/2012  . Smokeless tobacco: Never Used  . Alcohol use Yes     Comment: occasionally      Allergies   Review of patient's allergies indicates no known allergies.   Review of Systems Review of Systems  Constitutional: Negative for chills and fever.  HENT: Negative for facial swelling and sore throat.   Eyes: Negative for photophobia and visual disturbance.  Respiratory: Negative for shortness of breath.   Cardiovascular: Negative for chest pain.  Gastrointestinal: Negative for abdominal pain, nausea and vomiting.  Genitourinary: Negative for dysuria.  Musculoskeletal: Negative for back pain.  Skin: Negative for rash and wound.  Neurological: Positive for headaches. Negative for dizziness and light-headedness.  Psychiatric/Behavioral: The patient is not nervous/anxious.      Physical Exam Updated Vital Signs BP 117/92 (BP Location: Right Arm)   Pulse 66   Temp 98.5 F (36.9 C)   Resp 16    LMP 09/17/2015 (Exact Date)   SpO2 100%   Physical Exam  Constitutional: She appears well-developed and well-nourished. No distress.  HENT:  Head: Normocephalic and atraumatic.  Mouth/Throat: Oropharynx is clear and moist. No oropharyngeal exudate.  Eyes: Conjunctivae are normal. Pupils are equal, round, and reactive to light. Right eye exhibits no discharge. Left eye exhibits no discharge. No scleral icterus.  Neck: Normal range of motion and full passive range of motion without pain. Neck supple. No spinous process tenderness and no muscular tenderness present. Carotid bruit is not present. No neck rigidity. Normal range of motion present. No thyromegaly present.  No meningismus or tenderness to the midline; no tenderness anteriorly  Cardiovascular: Normal rate, regular rhythm, normal heart sounds and intact distal pulses.  Exam reveals no gallop and no friction rub.   No murmur heard. Pulmonary/Chest: Effort normal and breath sounds normal. No stridor. No respiratory distress. She has no wheezes. She has no rales.  Abdominal: Soft. Bowel sounds are normal. She exhibits no distension. There is no tenderness. There is no rebound and no guarding.  Musculoskeletal: She exhibits no edema.  No tenderness to lumbar spine or paraspinals  Lymphadenopathy:    She has no cervical adenopathy.  Neurological: She is alert. Coordination normal.  CN 3-12 intact; normal sensation throughout; 5/5 strength in all 4 extremities; equal bilateral grip strength; no ataxia on finger to nose; no pronator drift   Skin: Skin is warm and dry. No rash noted. She is not diaphoretic. No pallor.  Psychiatric: She has a normal mood and affect.  Nursing note and vitals reviewed.    ED Treatments / Results  Labs (all labs ordered are listed, but only abnormal results are displayed) Labs Reviewed  URINALYSIS, ROUTINE W REFLEX MICROSCOPIC (NOT AT [facility])  PREGNANCY, URINE  I-STAT CHEM 8, ED    EKG  EKG  Interpretation None       Radiology Ct Head Wo Contrast  Result Date: 09/26/2015 CLINICAL DATA:  Headache 3 days. EXAM: CT HEAD WITHOUT CONTRAST TECHNIQUE: Contiguous axial images were obtained from the base of the skull through the vertex without intravenous contrast. COMPARISON:  None. FINDINGS: Brain: The ventricles, cisterns and other CSF spaces are within normal. There is no mass, mass effect, shift of midline structures or acute hemorrhage. No evidence of acute infarction. Vascular: Within normal. Skull: Within normal. Sinuses/Orbits: Minimal opacification over the right sphenoid sinus and right posterior ethmoid air cells. Other: IMPRESSION: No acute intracranial findings. Minimal chronic sinus inflammatory change. Electronically Signed   By: Elberta Fortis M.D.   On: 09/26/2015 20:35    Procedures Procedures (including critical care time)  Medications Ordered in ED Medications  sodium chloride 0.9 % bolus 1,000 mL (0 mLs Intravenous Stopped 09/26/15 2020)  ketorolac (TORADOL) 30 MG/ML injection 30 mg (30 mg Intravenous Given 09/26/15 2000)  Initial Impression / Assessment and Plan / ED Course  I have reviewed the triage vital signs and the nursing notes.  Pertinent labs & imaging results that were available during my care of the patient were reviewed by me and considered in my medical decision making (see chart for details).  Clinical Course   I-STAT chem 8 unremarkable. UA negative.  Pt HA treated and improved to a 1/10 while in ED with Toradol 30 mg and 1L bolus of NaCl.  Presentation is non concerning [facility], ICH, Meningitis, or temporal arteritis. Pt is afebrile with no focal neuro deficits, nuchal rigidity, or change in vision.CT head shows no acute intracranial findings. Pt is to follow up with PCP for intermittent back pain that was not present today. Patient to follow-up with neurology for further evaluation of her intermittent tongue numbness and slurred speech in for  evaluation of her headaches continue. Patient discharged with ibuprofen as needed. Return precautions discussed. Pt verbalizes understanding and is agreeable with plan to discharge. I discussed patient with Dr. Effie ShyWentz who guided the patient's management and agrees with plan.   Final Clinical Impressions(s) / ED Diagnoses   Final diagnoses:  Acute nonintractable headache, unspecified headache type    New Prescriptions New Prescriptions   IBUPROFEN (ADVIL,MOTRIN) 800 MG TABLET    Take 1 tablet (800 mg total) by mouth every 8 (eight) hours as needed.     Emi Holeslexandra M Feather Berrie, PA-C 09/26/15 2107    Emi HolesAlexandra M Demarkus Remmel, PA-C 09/26/15 2109    Mancel BaleElliott Wentz, MD 09/27/15 [phone]

## 2015-09-26 NOTE — ED Triage Notes (Signed)
Pt states that she has had a headache since Sunday and is not usually prone to headaches. States that she tried otc meds w/o relief. Alert and oriented. Neuro intact.

## 2015-09-26 NOTE — Discharge Instructions (Signed)
Medications: ibuprofen  Treatment: Take ibuprofen every 8 hours as needed for your pain. Make sure to drink plenty of water.  Follow-up: Please follow-up with neurology for further evaluation and treatment of your symptoms. Please follow-up with your primary care provider for further evaluation and treatment of your intermittent back pain. Please return to the emergency department if you develop any new or worsening symptoms including numbness or tingling of the extremities, slurring of speech, weakness of one side of your body versus the other, or any other concerning symptoms..Marland Kitchen

## 2016-03-13 ENCOUNTER — Other Ambulatory Visit: Payer: Self-pay | Admitting: Obstetrics

## 2016-03-13 DIAGNOSIS — N760 Acute vaginitis: Principal | ICD-10-CM

## 2016-03-13 DIAGNOSIS — B9689 Other specified bacterial agents as the cause of diseases classified elsewhere: Secondary | ICD-10-CM

## 2016-03-13 NOTE — Telephone Encounter (Deleted)
Pt requests rf on Flagyl c/o abnormal vaginal discharge. Pt last seen 2015. Informed pt she needs an appt before rx can be sent to the pharmacy. Pt declined appt and said she recently had an aex with PCP so she will give them a call.   

## 2016-03-13 NOTE — Telephone Encounter (Signed)
Pt requests rf on Flagyl c/o abnormal vaginal discharge. Pt last seen 2015. Informed pt she needs an appt before rx can be sent to the pharmacy. Pt declined appt and said she recently had an aex with PCP so she will give them a call.

## 2016-03-27 ENCOUNTER — Ambulatory Visit: Payer: BLUE CROSS/BLUE SHIELD

## 2016-03-27 ENCOUNTER — Ambulatory Visit (INDEPENDENT_AMBULATORY_CARE_PROVIDER_SITE_OTHER): Payer: BLUE CROSS/BLUE SHIELD | Admitting: Podiatry

## 2016-03-27 ENCOUNTER — Ambulatory Visit (INDEPENDENT_AMBULATORY_CARE_PROVIDER_SITE_OTHER): Payer: BLUE CROSS/BLUE SHIELD

## 2016-03-27 ENCOUNTER — Encounter: Payer: Self-pay | Admitting: Podiatry

## 2016-03-27 VITALS — Resp 16 | Ht 61.0 in | Wt 240.0 lb

## 2016-03-27 DIAGNOSIS — M79671 Pain in right foot: Secondary | ICD-10-CM

## 2016-03-27 DIAGNOSIS — M779 Enthesopathy, unspecified: Secondary | ICD-10-CM | POA: Diagnosis not present

## 2016-03-27 DIAGNOSIS — M79672 Pain in left foot: Secondary | ICD-10-CM

## 2016-03-27 DIAGNOSIS — M7750 Other enthesopathy of unspecified foot: Secondary | ICD-10-CM | POA: Diagnosis not present

## 2016-03-27 MED ORDER — TRIAMCINOLONE ACETONIDE 10 MG/ML IJ SUSP
10.0000 mg | Freq: Once | INTRAMUSCULAR | Status: AC
  Administered 2016-03-27: 10 mg

## 2016-03-27 NOTE — Progress Notes (Signed)
   Subjective:    Patient ID: Victoria Robinson, female    DOB: 02-Oct-1985, 31 y.o.   MRN: [id number]  HPI  Chief Complaint  Patient presents with  . Foot Pain    BL; Lateral side x 6 months. Pt states that she has been dx with P/F in the right foot in the past.        Review of Systems     Objective:   Physical Exam        Assessment & Plan:

## 2016-03-28 NOTE — Progress Notes (Signed)
Subjective:     Patient ID: Victoria Robinson, female   DOB: 1986-01-17, 31 y.o.   MRN: [id number]  HPI patient states she's having a lot of pain in her right foot and heel and it's making it difficult to walk on it comfortably   Review of Systems  All other systems reviewed and are negative.      Objective:   Physical Exam  Constitutional: She is oriented to person, place, and time.  Cardiovascular: Intact distal pulses.   Musculoskeletal: Normal range of motion.  Neurological: She is oriented to person, place, and time.  Skin: Skin is warm.  Nursing note and vitals reviewed.  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

## 2016-04-17 ENCOUNTER — Ambulatory Visit: Payer: BLUE CROSS/BLUE SHIELD | Admitting: Podiatry

## 2016-07-03 ENCOUNTER — Emergency Department (HOSPITAL_COMMUNITY): Payer: BLUE CROSS/BLUE SHIELD

## 2016-07-03 ENCOUNTER — Encounter (HOSPITAL_COMMUNITY): Payer: Self-pay

## 2016-07-03 ENCOUNTER — Emergency Department (HOSPITAL_COMMUNITY)
Admission: EM | Admit: 2016-07-03 | Discharge: 2016-07-04 | Disposition: A | Discharge status: Home / Self Care | Payer: BLUE CROSS/BLUE SHIELD | Attending: Emergency Medicine | Admitting: Emergency Medicine

## 2016-07-03 DIAGNOSIS — K219 Gastro-esophageal reflux disease without esophagitis: Secondary | ICD-10-CM

## 2016-07-03 DIAGNOSIS — R112 Nausea with vomiting, unspecified: Secondary | ICD-10-CM

## 2016-07-03 DIAGNOSIS — J45909 Unspecified asthma, uncomplicated: Secondary | ICD-10-CM | POA: Diagnosis not present

## 2016-07-03 DIAGNOSIS — Z87891 Personal history of nicotine dependence: Secondary | ICD-10-CM | POA: Diagnosis not present

## 2016-07-03 DIAGNOSIS — R1011 Right upper quadrant pain: Secondary | ICD-10-CM

## 2016-07-03 DIAGNOSIS — Z79899 Other long term (current) drug therapy: Secondary | ICD-10-CM | POA: Insufficient documentation

## 2016-07-03 LAB — URINALYSIS, ROUTINE W REFLEX MICROSCOPIC
Bilirubin Urine: NEGATIVE
GLUCOSE, UA: NEGATIVE mg/dL
Ketones, ur: NEGATIVE mg/dL
Nitrite: NEGATIVE
Protein, ur: NEGATIVE mg/dL
SPECIFIC GRAVITY, URINE: 1.014 (ref 1.005–1.030)
pH: 5 (ref 5.0–8.0)

## 2016-07-03 LAB — COMPREHENSIVE METABOLIC PANEL
ALK PHOS: 47 U/L (ref 38–126)
ALT: 17 U/L (ref 14–54)
AST: 19 U/L (ref 15–41)
Albumin: 4.4 g/dL (ref 3.5–5.0)
Anion gap: 10 (ref 5–15)
BILIRUBIN TOTAL: 0.6 mg/dL (ref 0.3–1.2)
BUN: 8 mg/dL (ref 6–20)
CALCIUM: 8.7 mg/dL — AB (ref 8.9–10.3)
CO2: 25 mmol/L (ref 22–32)
CREATININE: 1.06 mg/dL — AB (ref 0.44–1.00)
Chloride: 101 mmol/L (ref 101–111)
GFR calc Af Amer: >60 mL/min (ref >60)
Glucose, Bld: 103 mg/dL — ABNORMAL HIGH (ref 65–99)
POTASSIUM: 3.1 mmol/L — AB (ref 3.5–5.1)
Sodium: 136 mmol/L (ref 135–145)
TOTAL PROTEIN: 8.2 g/dL — AB (ref 6.5–8.1)

## 2016-07-03 LAB — CBC
HCT: 41.8 % (ref 36.0–46.0)
Hemoglobin: 14 g/dL (ref 12.0–15.0)
MCH: 30.9 pg (ref 26.0–34.0)
MCHC: 33.5 g/dL (ref 30.0–36.0)
MCV: 92.3 fL (ref 78.0–100.0)
PLATELETS: 339 10*3/uL (ref 150–400)
RBC: 4.53 MIL/uL (ref 3.87–5.11)
RDW: 12.7 % (ref 11.5–15.5)
WBC: 4.6 10*3/uL (ref 4.0–10.5)

## 2016-07-03 LAB — LIPASE, BLOOD: Lipase: 19 U/L (ref 11–51)

## 2016-07-03 LAB — I-STAT BETA HCG BLOOD, ED (MC, WL, AP ONLY): I-stat hCG, quantitative: <5 m[IU]/mL (ref <5)

## 2016-07-03 MED ORDER — ONDANSETRON HCL 4 MG/2ML IJ SOLN
4.0000 mg | Freq: Once | INTRAMUSCULAR | Status: AC
  Administered 2016-07-03: 4 mg via INTRAVENOUS
  Filled 2016-07-03: qty 2

## 2016-07-03 MED ORDER — SODIUM CHLORIDE 0.9 % IV BOLUS (SEPSIS)
1000.0000 mL | Freq: Once | INTRAVENOUS | Status: AC
  Administered 2016-07-03: 1000 mL via INTRAVENOUS

## 2016-07-03 MED ORDER — MORPHINE SULFATE (PF) 2 MG/ML IV SOLN: 4.0000 mg | Freq: Once | INTRAVENOUS | Status: DC

## 2016-07-03 NOTE — ED Provider Notes (Signed)
[facility] Provider Note   CSN: [id number] Arrival date & time: 07/03/16  4098  By signing my name below, I, Cynda Acres, attest that this documentation has been prepared under the direction and in the presence of Everlene Farrier, PA-C. Electronically Signed: Cynda Acres, Scribe. 07/03/16. 11:05 PM.  History   Chief Complaint Chief Complaint  Patient presents with  . Abdominal Pain  . Diarrhea  . Nausea    HPI Comments: Victoria Robinson is a 31 y.o. female with no pertinent past medical history, who presents to the Emergency Department complaining of constant right upper abdominal pain 4 days ago. Patient reports gradually worsening symptoms for the past few days. Patient states she was seen by her PCP 4 days ago, who diagnosed her with a viral infection. She was advised to come here if her symptoms continue. Patient reports associated nausea, vomiting, diarrhea, decreased appetite. No modifying factors indicated. Patient reports a surgical history of an appendectomy. Patient denies any vaginal pain, vaginal discharge, dysuria, hematuria, hematochezia, hemoptysis, fever, and chills.   The history is provided by the patient. No language interpreter was used.  Diarrhea   Associated symptoms include abdominal pain and vomiting. Pertinent negatives include no chills, no headaches and no cough.    Past Medical History:  Diagnosis Date  . Abnormal Pap smear    repeat WNL  . Asthma childhood  . Shortness of breath     Patient Active Problem List   Diagnosis Date Noted  . Obesity 12/27/2012    Past Surgical History:  Procedure Laterality Date  . APPENDECTOMY      OB History    Gravida Para Term Preterm AB Living   3 2 2   1 2    SAB TAB Ectopic Multiple Live Births   1       2       Home Medications    Prior to Admission medications   Medication Sig Start Date End Date Taking? Authorizing Provider  acetaminophen (TYLENOL) 325 MG tablet Take 650 mg by mouth  every 6 (six) hours as needed for moderate pain.   Yes [provider]  ondansetron (ZOFRAN) 4 MG tablet Take 4 mg by mouth every 8 (eight) hours as needed for nausea or vomiting.   Yes [provider]  naproxen (NAPROSYN) 250 MG tablet Take 1 tablet (250 mg total) by mouth 2 (two) times daily with a meal. As needed for pain. 07/04/16   Everlene Farrier, PA-C  omeprazole (PRILOSEC) 20 MG capsule Take 1 capsule (20 mg total) by mouth daily. 07/04/16   Everlene Farrier, PA-C  ondansetron (ZOFRAN ODT) 4 MG disintegrating tablet Take 1 tablet (4 mg total) by mouth every 8 (eight) hours as needed for nausea or vomiting. 07/04/16   Everlene Farrier, PA-C    Family History Family History  Problem Relation Age of Onset  . Hyperlipidemia Mother   . Hyperlipidemia Father   . Hypertension Father   . Diabetes Maternal Grandmother   . Diabetes Paternal Grandmother     Social History Social History  Substance Use Topics  . Smoking status: Former Smoker    Quit date: 09/17/2012  . Smokeless tobacco: Never Used  . Alcohol use Yes     Comment: occasionally      Allergies   Patient has no known allergies.   Review of Systems Review of Systems  Constitutional: Positive for appetite change. Negative for chills and fever.  HENT: Negative for congestion and sore throat.  Eyes: Negative for visual disturbance.  Respiratory: Negative for cough and shortness of breath.   Cardiovascular: Negative for chest pain.  Gastrointestinal: Positive for abdominal pain, diarrhea, nausea and vomiting. Negative for blood in stool.  Genitourinary: Negative for difficulty urinating, dysuria, flank pain, frequency, hematuria, urgency, vaginal bleeding and vaginal discharge.  Musculoskeletal: Negative for back pain and neck pain.  Skin: Negative for rash.  Neurological: Negative for syncope and headaches.     Physical Exam Updated Vital Signs BP 106/67 (BP Location: Right Arm)   Pulse 92   Temp 98.3  F (36.8 C) (Oral)   Resp 19   Ht 5\' 1"  (1.549 m)   Wt 113.4 kg (250 lb)   LMP 06/09/2016   SpO2 97%   BMI 47.24 kg/m   Physical Exam  Constitutional: She appears well-developed and well-nourished. No distress.  Non-toxic appearing.   HENT:  Head: Normocephalic and atraumatic.  Mouth/Throat: Oropharynx is clear and moist.  Moist mucous membranes.   Eyes: Conjunctivae are normal. Pupils are equal, round, and reactive to light. Right eye exhibits no discharge. Left eye exhibits no discharge.  Neck: Neck supple.  Cardiovascular: Normal rate, regular rhythm, normal heart sounds and intact distal pulses.  Exam reveals no gallop and no friction rub.   No murmur heard. Pulmonary/Chest: Effort normal and breath sounds normal. No respiratory distress. She has no wheezes. She has no rales.  Abdominal: Soft. Bowel sounds are normal. She exhibits no distension and no mass. There is tenderness. There is no rebound and no guarding.  Abdomen is soft, bowel sounds are present. RUQ TTP. No CVA or flank tenderness. No lower abdominal tenderness.   Musculoskeletal: Normal range of motion. She exhibits no edema.  Lymphadenopathy:    She has no cervical adenopathy.  Neurological: She is alert. Coordination normal.  Skin: Skin is warm and dry. Capillary refill takes less than 2 seconds. No rash noted. She is not diaphoretic. No erythema. No pallor.  Psychiatric: She has a normal mood and affect. Her behavior is normal.  Nursing note and vitals reviewed.    ED Treatments / Results  DIAGNOSTIC STUDIES: Oxygen Saturation is 99% on RA, normal by my interpretation.    COORDINATION OF CARE: 11:04 PM Discussed treatment plan with pt at bedside and pt agreed to plan, which includes an abdominal CT.   Labs (all labs ordered are listed, but only abnormal results are displayed) Labs Reviewed  COMPREHENSIVE METABOLIC PANEL - Abnormal; Notable for the following:       Result Value   Potassium 3.1 (*)     Glucose, Bld 103 (*)    Creatinine, Ser 1.06 (*)    Calcium 8.7 (*)    Total Protein 8.2 (*)    All other components within normal limits  URINALYSIS, ROUTINE W REFLEX MICROSCOPIC - Abnormal; Notable for the following:    APPearance HAZY (*)    Hgb urine dipstick SMALL (*)    Leukocytes, UA SMALL (*)    Bacteria, UA RARE (*)    Squamous Epithelial / LPF 6-30 (*)    All other components within normal limits  LIPASE, BLOOD  CBC  I-STAT BETA HCG BLOOD, ED ([facility], [facility], [facility] ONLY)    EKG  EKG Interpretation None       Radiology US Renal  Result Date: 07/04/2016 CLINICAL DATA:  Right flank pain EXAM: RENAL / URINARY TRACT ULTRASOUND COMPLETE COMPARISON:  None. FINDINGS: Right Kidney: Length: 11.2 cm. Echogenicity within normal limits. No mass or  hydronephrosis visualized. Left Kidney: Length: 10.6 cm. Echogenicity within normal limits. No mass or hydronephrosis visualized. Bladder: Appears normal for degree of bladder distention. IMPRESSION: Negative renal ultrasound Electronically Signed   By: Jasmine PangKim  Fujinaga M.D.   On: 07/04/2016 01:19   [place] Abdomen Limited  Result Date: 07/04/2016 CLINICAL DATA:  Right upper quadrant abdominal pain EXAM: US ABDOMEN LIMITED - RIGHT UPPER QUADRANT COMPARISON:  None. FINDINGS: Gallbladder: No gallstones or wall thickening visualized. No sonographic Murphy sign noted by sonographer. Common bile duct: Diameter: 4 mm Liver: No focal lesion identified. Within normal limits in parenchymal echogenicity. IMPRESSION: Negative right upper quadrant abdominal ultrasound Electronically Signed   By: Jasmine PangKim  Fujinaga M.D.   On: 07/04/2016 01:18    Procedures Procedures (including critical care time)  Medications Ordered in ED Medications  morphine 2 MG/ML injection 4 mg (not administered)  sodium chloride 0.9 % bolus 1,000 mL (1,000 mLs Intravenous New Bag/Given 07/03/16 2311)  ondansetron (ZOFRAN) injection 4 mg (4 mg Intravenous Given 07/03/16 2323)     Initial Impression  / Assessment and Plan / ED Course  I have reviewed the triage vital signs and the nursing notes.  Pertinent labs & imaging results that were available during my care of the patient were reviewed by me and considered in my medical decision making (see chart for details).    This  is a 31 y.o. female with no pertinent past medical history, who presents to the Emergency Department complaining of constant right upper abdominal pain 4 days ago. Patient reports gradually worsening symptoms for the past few days. Patient states she was seen by her PCP 4 days ago, who diagnosed her with a viral infection. She was advised to come here if her symptoms continue. Patient reports associated nausea, vomiting, diarrhea, decreased appetite. No modifying factors indicated. Patient reports a surgical history of an appendectomy. On exam the patient is afebrile and nontoxic appearing. Her abdomen is soft and she is right upper quadrant abdominal tenderness to palpation. No peritoneal signs. No CVA or flank tenderness. Pregnancy test is negative. Urinalysis is without sign of infection. Lipase is within normal limits. CMP is unremarkable. Normal liver enzymes. CBC is within normal limits. Renal ultrasound and right upper quadrant ultrasound were obtained. With these were unremarkable. Her reevaluation patient reports feeling much better. She is able to tolerate water and crackers without nausea or vomiting. She feels ready for discharge. She declined the pain medicine and only had nausea medicine. This could be biliary colic and gallbladder dysfunction versus peptic ulcer. Will discharge her with omeprazole and Zofran and have her follow-up with general surgery clinic. I discussed strict and specific return precautions. I advised the patient to follow-up with their primary care provider this week. I advised the patient to return to the emergency department with new or worsening symptoms or new concerns. The patient verbalized  understanding and agreement with plan.      Final Clinical Impressions(s) / ED Diagnoses   Final diagnoses:  Non-intractable vomiting with nausea, unspecified vomiting type  RUQ abdominal pain  Gastroesophageal reflux disease without esophagitis    New Prescriptions New Prescriptions   NAPROXEN (NAPROSYN) 250 MG TABLET    Take 1 tablet (250 mg total) by mouth 2 (two) times daily with a meal. As needed for pain.   OMEPRAZOLE (PRILOSEC) 20 MG CAPSULE    Take 1 capsule (20 mg total) by mouth daily.   ONDANSETRON (ZOFRAN ODT) 4 MG DISINTEGRATING TABLET    Take 1 tablet (  4 mg total) by mouth every 8 (eight) hours as needed for nausea or vomiting.   I personally performed the services described in this documentation, which was scribed in my presence. The recorded information has been reviewed and is accurate.      Everlene Farrier, PA-C 07/04/16 0250    Dione Booze, MD 07/04/16 0630

## 2016-07-03 NOTE — ED Triage Notes (Signed)
Patient c/o vomiting and right mid abdominal pain x 5 days. Diarrhea x 3 days. No blood in the diarrhea.

## 2016-07-04 MED ORDER — OMEPRAZOLE 20 MG PO CPDR: 20.0000 mg | DELAYED_RELEASE_CAPSULE | Freq: Every day | ORAL | 0 refills | Status: DC

## 2016-07-04 MED ORDER — ONDANSETRON 4 MG PO TBDP: 4.0000 mg | ORAL_TABLET | Freq: Three times a day (TID) | ORAL | 0 refills | Status: DC | PRN

## 2016-07-04 MED ORDER — NAPROXEN 250 MG PO TABS: 250.0000 mg | ORAL_TABLET | Freq: Two times a day (BID) | ORAL | 0 refills | Status: DC

## 2016-10-15 DIAGNOSIS — F17200 Nicotine dependence, unspecified, uncomplicated: Secondary | ICD-10-CM | POA: Diagnosis not present

## 2016-10-15 DIAGNOSIS — N390 Urinary tract infection, site not specified: Secondary | ICD-10-CM | POA: Diagnosis not present

## 2016-10-15 DIAGNOSIS — R030 Elevated blood-pressure reading, without diagnosis of hypertension: Secondary | ICD-10-CM | POA: Diagnosis not present

## 2016-11-12 DIAGNOSIS — Z79899 Other long term (current) drug therapy: Secondary | ICD-10-CM | POA: Diagnosis not present

## 2016-11-12 DIAGNOSIS — Z0001 Encounter for general adult medical examination with abnormal findings: Secondary | ICD-10-CM | POA: Diagnosis not present

## 2016-11-12 DIAGNOSIS — R109 Unspecified abdominal pain: Secondary | ICD-10-CM | POA: Diagnosis not present

## 2016-11-13 DIAGNOSIS — Z0000 Encounter for general adult medical examination without abnormal findings: Secondary | ICD-10-CM | POA: Diagnosis not present

## 2016-11-13 DIAGNOSIS — E559 Vitamin D deficiency, unspecified: Secondary | ICD-10-CM | POA: Diagnosis not present

## 2016-12-03 ENCOUNTER — Encounter: Payer: Self-pay | Admitting: Obstetrics and Gynecology

## 2016-12-03 ENCOUNTER — Ambulatory Visit (INDEPENDENT_AMBULATORY_CARE_PROVIDER_SITE_OTHER): Payer: BLUE CROSS/BLUE SHIELD | Admitting: Obstetrics and Gynecology

## 2016-12-03 VITALS — BP 134/88 | HR 77 | Wt 259.0 lb

## 2016-12-03 DIAGNOSIS — Z30432 Encounter for removal of intrauterine contraceptive device: Secondary | ICD-10-CM

## 2016-12-03 DIAGNOSIS — Z3009 Encounter for other general counseling and advice on contraception: Secondary | ICD-10-CM

## 2016-12-03 MED ORDER — NORETHINDRONE 0.35 MG PO TABS: 1.0000 | ORAL_TABLET | Freq: Every day | ORAL | 11 refills | Status: DC

## 2016-12-03 NOTE — Progress Notes (Signed)
GYNECOLOGY OFFICE VISIT NOTE  History:  31 y.o. [id number] here today for Mirena IUD removal. She denies any abnormal vaginal discharge or other concerns. She reports several year difficulty with the Mirena, reports pain with intercourse, sometimes deep, sometimes on entry, but that intercoruse was "not been the same" since she had the Mirena placed. Also has been diagnosed with right sided ovarian cyst that has decreased in size but remains. [place] records not available. Also with intermittent pain in right lower pelvis. Not really related to anything. Has had regular periods with the Mirena but in the last 6 months, has had irregular bleeding. Would like to get pregnant next year and not ready for permanent sterilization.  Mirena placed 04/04/2013 by Dr. Tamela Oddi.  Past Medical History:  Diagnosis Date  . Abnormal Pap smear    repeat WNL  . Asthma childhood  . Shortness of breath     Past Surgical History:  Procedure Laterality Date  . APPENDECTOMY      Current Outpatient Prescriptions:  .  acetaminophen (TYLENOL) 325 MG tablet, Take 650 mg by mouth every 6 (six) hours as needed for moderate pain., Disp: , Rfl:  .  naproxen (NAPROSYN) 250 MG tablet, Take 1 tablet (250 mg total) by mouth 2 (two) times daily with a meal. As needed for pain. (Patient not taking: Reported on 12/03/2016), Disp: 30 tablet, Rfl: 0 .  omeprazole (PRILOSEC) 20 MG capsule, Take 1 capsule (20 mg total) by mouth daily. (Patient not taking: Reported on 12/03/2016), Disp: 30 capsule, Rfl: 0 .  ondansetron (ZOFRAN ODT) 4 MG disintegrating tablet, Take 1 tablet (4 mg total) by mouth every 8 (eight) hours as needed for nausea or vomiting. (Patient not taking: Reported on 12/03/2016), Disp: 10 tablet, Rfl: 0 .  ondansetron (ZOFRAN) 4 MG tablet, Take 4 mg by mouth every 8 (eight) hours as needed for nausea or vomiting., Disp: , Rfl:   The following portions of the patient's history were reviewed and updated as appropriate:  allergies, current medications, past family history, past medical history, past social history, past surgical history and problem list.   Health Maintenance:  Last pap: reports normal at PCP office  Review of Systems:  Pertinent items noted in HPI and remainder of comprehensive ROS otherwise negative.   Objective:  Physical Exam BP 134/88   Pulse 77   Wt 259 lb (117.5 kg)   LMP 11/09/2016   BMI 48.94 kg/m  CONSTITUTIONAL: Well-developed, well-nourished female in no acute distress.  HENT:  Normocephalic, atraumatic. External right and left ear normal. Oropharynx is clear and moist EYES: Conjunctivae and EOM are normal. Pupils are equal, round, and reactive to light. No scleral icterus.  NECK: Normal range of motion, supple, no masses SKIN: Skin is warm and dry. No rash noted. Not diaphoretic. No erythema. No pallor. NEUROLOGIC: Alert and oriented to person, place, and time. Normal reflexes, muscle tone coordination. No cranial nerve deficit noted. PSYCHIATRIC: Normal mood and affect. Normal behavior. Normal judgment and thought content. CARDIOVASCULAR: Normal heart rate noted RESPIRATORY: Effort  normal, no problems with respiration noted ABDOMEN: Soft, no distention noted, mild tenderness in RLQ PELVIC: normal external genitalia, scant dark red blood in vault, multiparous cervix noted with IUD strings visible protruding from os MUSCULOSKELETAL: Normal range of motion. No edema noted.  Labs and Imaging No results found.  Assessment & Plan:   Counseling patient extensively regarding IUD removal and that without being able to view records, I cannot confirm presence of ovarian  cysts. Reviewed that removal of IUD may not improve her pain if it is not ovarian in origin. She is tender in RLQ and given her reported history of issues with Mirena since placement, it is reasonable to remove. Please see procedure note for removal.  Had conversation regarding contraception as she would like to  get pregnant again but not for another year. Counseled extensively regarding options for birth control, she does not want Nexplanon in arm or to use depo since she is already overweight. Reviewed that between her obesity and smoking, she is not a great candidate for combined OCPs since the risk of CV problems is increased however, reviewed the CDC contraception guidelines which states benefits of OCPs generally outweighs the risks for both conditions. However, patient does not want to take the risk and opts for progesterone only pills, reviewed importance of taking at the same time of the day for efficacy. She verbalizes understanding.   Mirena IUD removed intact POPs sent to pharmacy Patient will sign release to send records over from PCP for US and Pap   Routine preventative health maintenance measures emphasized. Please refer to After Visit Summary for other counseling recommendations.    Baldemar LenisK. Meryl Kisha Messman, M.D. Attending Obstetrician & Gynecologist, [facility], [facility]

## 2016-12-03 NOTE — Progress Notes (Signed)
    GYNECOLOGY OFFICE PROCEDURE NOTE  Victoria Robinson is a 31 y.o. [id number] here for Mirena IUD removal due to pelvic pain and ovarian cysts. She reports last pap was normal, (records not available).  IUD Removal Patient identified, informed consent performed, consent signed.   Discussed risks of irregular bleeding, cramping, infection, malpositioning or misplacement of the IUD outside the uterus which may require further procedures. Also advised to use backup contraception for one week. Time out was performed. Speculum placed in the vagina. The strings of the IUD were grasped and pulled using ring forceps. The IUD was successfully removed in its entirety.  Patient was given post-procedure instructions.  She was reminded to have backup contraception for one week during transition period as she starts POPs.  Baldemar LenisK. Meryl Alvin Diffee, M.D. Attending Obstetrician & Gynecologist, [facility], [facility]

## 2016-12-03 NOTE — Progress Notes (Signed)
Pt states she would like IUD removed today.  Pt states she has had too man "problems" since having IUD. Pt interested in Pill for BC.

## 2016-12-04 DIAGNOSIS — Z23 Encounter for immunization: Secondary | ICD-10-CM | POA: Diagnosis not present

## 2016-12-12 ENCOUNTER — Ambulatory Visit (HOSPITAL_COMMUNITY)
Admission: EM | Admit: 2016-12-12 | Discharge: 2016-12-12 | Disposition: A | Discharge status: Home / Self Care | Payer: BLUE CROSS/BLUE SHIELD | Attending: Internal Medicine | Admitting: Internal Medicine

## 2016-12-12 ENCOUNTER — Encounter (HOSPITAL_COMMUNITY): Payer: Self-pay | Admitting: Family Medicine

## 2016-12-12 DIAGNOSIS — Z9889 Other specified postprocedural states: Secondary | ICD-10-CM | POA: Insufficient documentation

## 2016-12-12 DIAGNOSIS — Z833 Family history of diabetes mellitus: Secondary | ICD-10-CM | POA: Diagnosis not present

## 2016-12-12 DIAGNOSIS — Z79899 Other long term (current) drug therapy: Secondary | ICD-10-CM | POA: Insufficient documentation

## 2016-12-12 DIAGNOSIS — J45909 Unspecified asthma, uncomplicated: Secondary | ICD-10-CM | POA: Insufficient documentation

## 2016-12-12 DIAGNOSIS — Z8249 Family history of ischemic heart disease and other diseases of the circulatory system: Secondary | ICD-10-CM | POA: Insufficient documentation

## 2016-12-12 DIAGNOSIS — M79602 Pain in left arm: Secondary | ICD-10-CM | POA: Diagnosis not present

## 2016-12-12 DIAGNOSIS — R21 Rash and other nonspecific skin eruption: Secondary | ICD-10-CM | POA: Insufficient documentation

## 2016-12-12 DIAGNOSIS — F17210 Nicotine dependence, cigarettes, uncomplicated: Secondary | ICD-10-CM | POA: Insufficient documentation

## 2016-12-12 DIAGNOSIS — E669 Obesity, unspecified: Secondary | ICD-10-CM | POA: Diagnosis not present

## 2016-12-12 MED ORDER — ACYCLOVIR 400 MG PO TABS: 400.0000 mg | ORAL_TABLET | Freq: Three times a day (TID) | ORAL | 0 refills | Status: AC

## 2016-12-12 MED ORDER — CEPHALEXIN 500 MG PO CAPS: 500.0000 mg | ORAL_CAPSULE | Freq: Four times a day (QID) | ORAL | 0 refills | Status: AC

## 2016-12-12 NOTE — Discharge Instructions (Signed)
We will notify you of any positive results. Please take both medications. If you experience worsening of symptoms please return to be seen. If your symptoms do not improve in the next 1 week please see your PCP or return to be seen. Warm compress under left arm. Try to keep lesions intact

## 2016-12-12 NOTE — ED Triage Notes (Signed)
Pt here for blisters to left hand. sts started itching when it first started but now painful. She cut her finger a few days ago making lemonade water. Also C/O swelling in left axilla.

## 2016-12-12 NOTE — ED Provider Notes (Signed)
[facility]    CSN: [id number] Arrival date & time: 12/12/16  1935     History   Chief Complaint Chief Complaint  Patient presents with  . Rash    HPI Victoria Robinson is a 31 y.o. female.   Victoria Robinson presents with complaints of left arm pain and blisters which developed two days ago. She states it was originally itchy prior to blisters developing. She states she feels she had a similar rash two years ago which resolved on its own. The pain is now starting to spread up her arm to her elbow, with redness and mild itchy. Palmar lesions no longer itchy. No exposures to any chemicals or allergens. No fevers. Has been applying hydrocortisone which has not helped.   ROS per HPI.       Past Medical History:  Diagnosis Date  . Abnormal Pap smear    repeat WNL  . Asthma childhood  . Shortness of breath     Patient Active Problem List   Diagnosis Date Noted  . Obesity 12/27/2012    Past Surgical History:  Procedure Laterality Date  . APPENDECTOMY      OB History    Gravida Para Term Preterm AB Living   3 2 2   1 2    SAB TAB Ectopic Multiple Live Births   1       2       Home Medications    Prior to Admission medications   Medication Sig Start Date End Date Taking? Authorizing Provider  acetaminophen (TYLENOL) 325 MG tablet Take 650 mg by mouth every 6 (six) hours as needed for moderate pain.    [provider]  acyclovir (ZOVIRAX) 400 MG tablet Take 1 tablet (400 mg total) 3 (three) times daily for 10 days by mouth. 12/12/16 12/22/16  Georgetta HaberBurky, Tracey Stewart B, NP  cephALEXin (KEFLEX) 500 MG capsule Take 1 capsule (500 mg total) 4 (four) times daily for 10 days by mouth. 12/12/16 12/22/16  Georgetta HaberBurky, Kain Milosevic B, NP  naproxen (NAPROSYN) 250 MG tablet Take 1 tablet (250 mg total) by mouth 2 (two) times daily with a meal. As needed for pain. Patient not taking: Reported on 12/03/2016 07/04/16   Everlene Farrieransie, William, PA-C  norethindrone (MICRONOR,CAMILA,ERRIN) 0.35 MG  tablet Take 1 tablet (0.35 mg total) by mouth daily. 12/03/16   Conan Bowensavis, Kelly M, MD  omeprazole (PRILOSEC) 20 MG capsule Take 1 capsule (20 mg total) by mouth daily. Patient not taking: Reported on 12/03/2016 07/04/16   Everlene Farrieransie, William, PA-C  ondansetron (ZOFRAN ODT) 4 MG disintegrating tablet Take 1 tablet (4 mg total) by mouth every 8 (eight) hours as needed for nausea or vomiting. Patient not taking: Reported on 12/03/2016 07/04/16   Everlene Farrieransie, William, PA-C  ondansetron (ZOFRAN) 4 MG tablet Take 4 mg by mouth every 8 (eight) hours as needed for nausea or vomiting.    [provider]    Family History Family History  Problem Relation Age of Onset  . Hyperlipidemia Mother   . Hyperlipidemia Father   . Hypertension Father   . Diabetes Maternal Grandmother   . Diabetes Paternal Grandmother     Social History Social History   Tobacco Use  . Smoking status: Current Every Day Smoker    Packs/day: 0.50    Types: Cigarettes  . Smokeless tobacco: Never Used  Substance Use Topics  . Alcohol use: Yes    Comment: occasionally   . Drug use: No     Allergies  Patient has no known allergies.   Review of Systems Review of Systems   Physical Exam Triage Vital Signs ED Triage Vitals  Enc Vitals Group     BP 12/12/16 2005 (!) 134/96     Pulse Rate 12/12/16 2005 95     Resp 12/12/16 2005 18     Temp 12/12/16 2005 99.8 F (37.7 C)     Temp src --      SpO2 12/12/16 2005 97 %     Weight --      Height --      Head Circumference --      Peak Flow --      Pain Score 12/12/16 2004 0     Pain Loc --      Pain Edu? --      Excl. in GC? --    No data found.  Updated Vital Signs BP (!) 134/96   Pulse 95   Temp 99.8 F (37.7 C)   Resp 18   LMP 12/03/2016   SpO2 97%   Visual Acuity Right Eye Distance:   Left Eye Distance:   Bilateral Distance:    Right Eye Near:   Left Eye Near:    Bilateral Near:     Physical Exam  Constitutional: She is oriented to person,  place, and time. She appears well-developed and well-nourished. No distress.  Cardiovascular: Normal rate, regular rhythm and normal heart sounds.  Pulmonary/Chest: Effort normal and breath sounds normal.  Lymphadenopathy:    She has axillary adenopathy.  Left axillary adenopathy noted  Neurological: She is alert and oriented to person, place, and time.  Skin: Skin is warm and dry. Lesion and rash noted. Rash is vesicular.     Blisters to palmar aspect of left thumb with redness extending up forearm; tender; see photo  Vitals reviewed.      UC Treatments / Results  Labs (all labs ordered are listed, but only abnormal results are displayed) Labs Reviewed  HSV CULTURE AND TYPING  AEROBIC CULTURE (SUPERFICIAL SPECIMEN)    EKG  EKG Interpretation None       Radiology No results found.  Procedures Procedures (including critical care time)  Medications Ordered in UC Medications - No data to display   Initial Impression / Assessment and Plan / UC Course  I have reviewed the triage vital signs and the nursing notes.  Pertinent labs & imaging results that were available during my care of the patient were reviewed by me and considered in my medical decision making (see chart for details).     Small puncture to small vesicle for testing for HSV as well as culture. Initiated antibiotics due to redness and extension of redness up arm. Antiviral as well due to vesicular in nature rash. Will notify patient of any positive findings or change of treatment. If symptoms worsen or do not improve in the next week to return to be seen or to follow up with PCP. Patient verbalized understanding and agreeable to plan.     Final Clinical Impressions(s) / UC Diagnoses   Final diagnoses:  Rash    ED Discharge Orders        Ordered    acyclovir (ZOVIRAX) 400 MG tablet  3 times daily     12/12/16 2115    cephALEXin (KEFLEX) 500 MG capsule  4 times daily     12/12/16 2115        Controlled Substance Prescriptions [place] Controlled Substance Registry consulted? Not Applicable  Georgetta Haber, NP 12/12/16 2123

## 2016-12-15 LAB — HSV CULTURE AND TYPING

## 2016-12-15 LAB — AEROBIC CULTURE  (SUPERFICIAL SPECIMEN): CULTURE: NO GROWTH

## 2016-12-15 LAB — AEROBIC CULTURE W GRAM STAIN (SUPERFICIAL SPECIMEN): Gram Stain: NONE SEEN

## 2016-12-31 ENCOUNTER — Encounter: Payer: Self-pay | Admitting: *Deleted

## 2017-04-22 DIAGNOSIS — R5383 Other fatigue: Secondary | ICD-10-CM | POA: Diagnosis not present

## 2017-04-22 DIAGNOSIS — E559 Vitamin D deficiency, unspecified: Secondary | ICD-10-CM | POA: Diagnosis not present

## 2017-04-22 DIAGNOSIS — E785 Hyperlipidemia, unspecified: Secondary | ICD-10-CM | POA: Diagnosis not present

## 2017-04-22 DIAGNOSIS — F17200 Nicotine dependence, unspecified, uncomplicated: Secondary | ICD-10-CM | POA: Diagnosis not present

## 2017-04-23 DIAGNOSIS — E785 Hyperlipidemia, unspecified: Secondary | ICD-10-CM | POA: Diagnosis not present

## 2017-04-23 DIAGNOSIS — R5383 Other fatigue: Secondary | ICD-10-CM | POA: Diagnosis not present

## 2017-04-23 DIAGNOSIS — E559 Vitamin D deficiency, unspecified: Secondary | ICD-10-CM | POA: Diagnosis not present

## 2017-07-24 ENCOUNTER — Encounter: Payer: Self-pay | Admitting: Podiatry

## 2017-07-24 ENCOUNTER — Ambulatory Visit (INDEPENDENT_AMBULATORY_CARE_PROVIDER_SITE_OTHER): Payer: BLUE CROSS/BLUE SHIELD

## 2017-07-24 ENCOUNTER — Ambulatory Visit (INDEPENDENT_AMBULATORY_CARE_PROVIDER_SITE_OTHER): Payer: BLUE CROSS/BLUE SHIELD | Admitting: Podiatry

## 2017-07-24 DIAGNOSIS — M722 Plantar fascial fibromatosis: Secondary | ICD-10-CM

## 2017-07-24 MED ORDER — TRIAMCINOLONE ACETONIDE 10 MG/ML IJ SUSP
10.0000 mg | Freq: Once | INTRAMUSCULAR | Status: AC
  Administered 2017-07-24: 10 mg

## 2017-07-27 ENCOUNTER — Other Ambulatory Visit (HOSPITAL_COMMUNITY)
Admission: RE | Admit: 2017-07-27 | Discharge: 2017-07-27 | Disposition: A | Discharge status: Home / Self Care | Payer: BLUE CROSS/BLUE SHIELD | Source: Ambulatory Visit | Attending: Obstetrics and Gynecology | Admitting: Obstetrics and Gynecology

## 2017-07-27 ENCOUNTER — Encounter: Payer: Self-pay | Admitting: Obstetrics and Gynecology

## 2017-07-27 ENCOUNTER — Ambulatory Visit (INDEPENDENT_AMBULATORY_CARE_PROVIDER_SITE_OTHER): Payer: BLUE CROSS/BLUE SHIELD | Admitting: Obstetrics and Gynecology

## 2017-07-27 VITALS — BP 127/86 | HR 82 | Wt 260.5 lb

## 2017-07-27 DIAGNOSIS — A749 Chlamydial infection, unspecified: Secondary | ICD-10-CM | POA: Insufficient documentation

## 2017-07-27 DIAGNOSIS — N760 Acute vaginitis: Secondary | ICD-10-CM | POA: Insufficient documentation

## 2017-07-27 DIAGNOSIS — B9689 Other specified bacterial agents as the cause of diseases classified elsewhere: Secondary | ICD-10-CM | POA: Diagnosis not present

## 2017-07-27 DIAGNOSIS — R102 Pelvic and perineal pain: Secondary | ICD-10-CM | POA: Diagnosis not present

## 2017-07-27 DIAGNOSIS — N898 Other specified noninflammatory disorders of vagina: Secondary | ICD-10-CM

## 2017-07-27 DIAGNOSIS — Z3202 Encounter for pregnancy test, result negative: Secondary | ICD-10-CM | POA: Diagnosis not present

## 2017-07-27 LAB — POCT URINALYSIS DIPSTICK
Bilirubin, UA: NEGATIVE
GLUCOSE UA: NEGATIVE
Ketones, UA: NEGATIVE
Nitrite, UA: NEGATIVE
PH UA: 6 (ref 5.0–8.0)
PROTEIN UA: POSITIVE — AB
Spec Grav, UA: 1.025 (ref 1.010–1.025)
Urobilinogen, UA: 0.2 E.U./dL

## 2017-07-27 LAB — POCT URINE PREGNANCY: PREG TEST UR: NEGATIVE

## 2017-07-27 NOTE — Progress Notes (Signed)
Pt presents for problem visit today c/o pain on right side  Pt states pain comes and goes  2/10x pain  Pt also reports pain w/ intercourse..   Pt states she was told last yr she had a cyst.   Pt denies any dysuria, no burning, no blood in urine. Periods are monthly.

## 2017-07-27 NOTE — Progress Notes (Signed)
   GYNECOLOGY OFFICE FOLLOW UP NOTE  History:  32 y.o. [id number] here today for follow up for pain on right side. Has right sided pain, down in pelvis. Comes and goes, feels it during intercourse sometimes. States pain is 7/10 at its worst. No improvement in pain since Mirena IUD removed in 12/2016. Also having white discharge with odor for about 10 days. Denies other complaints. Period lighter this month. She is no longer taking POPs, using withdrawal.  Past Medical History:  Diagnosis Date  . Abnormal Pap smear    repeat WNL  . Asthma childhood  . Shortness of breath     Past Surgical History:  Procedure Laterality Date  . APPENDECTOMY      Current Outpatient Medications:  .  acetaminophen (TYLENOL) 325 MG tablet, Take 650 mg by mouth every 6 (six) hours as needed for moderate pain., Disp: , Rfl:  .  ondansetron (ZOFRAN) 4 MG tablet, Take 4 mg by mouth every 8 (eight) hours as needed for nausea or vomiting., Disp: , Rfl:   The following portions of the patient's history were reviewed and updated as appropriate: allergies, current medications, past family history, past medical history, past social history, past surgical history and problem list.   Review of Systems:  Pertinent items noted in HPI and remainder of comprehensive ROS otherwise negative.   Objective:  Physical Exam BP 127/86   Pulse 82   Wt 260 lb 8 oz (118.2 kg)   LMP 07/12/2017   BMI 49.22 kg/m  CONSTITUTIONAL: Well-developed, well-nourished female in no acute distress.  HENT:  Normocephalic, atraumatic. External right and left ear normal. Oropharynx is clear and moist EYES: Conjunctivae and EOM are normal. Pupils are equal, round, and reactive to light. No scleral icterus.  NECK: Normal range of motion, supple, no masses SKIN: Skin is warm and dry. No rash noted. Not diaphoretic. No erythema. No pallor. NEUROLOGIC: Alert and oriented to person, place, and time. Normal reflexes, muscle tone coordination. No  cranial nerve deficit noted. PSYCHIATRIC: Normal mood and affect. Normal behavior. Normal judgment and thought content. CARDIOVASCULAR: Normal heart rate noted RESPIRATORY: Effort and breath sounds normal, no problems with respiration noted ABDOMEN: Soft, no distention noted.   PELVIC: normal appearing external female genitalia, normal appearing vagina with scant white discharge, normal appearing multiparous cervix, some tenderness in right adnexa but no masses palpable, uterus difficult to palpate 2/2 habitus, no adnexal tenderness or masses in left adnexa MUSCULOSKELETAL: Normal range of motion. No edema noted.  Labs and Imaging Dg Foot 2 Views Right  Result Date: 07/24/2017 Please see detailed radiograph report in office note.   Assessment & Plan:  1. Pelvic pain Patient reports h/o ovarian cysts, have requested records from [facility], will re-request today. Tenderness in right adnexa, will obtain US to follow up on h/o ovarian cysts - POCT Urinalysis Dipstick - POCT urine pregnancy   Routine preventative health maintenance measures emphasized. Please refer to After Visit Summary for other counseling recommendations.   Return in about 3 months (around 10/27/2017).   Baldemar LenisK. Meryl Josphine Laffey, M.D. Attending Obstetrician & Gynecologist, [facility], [facility]

## 2017-07-29 LAB — CERVICOVAGINAL ANCILLARY ONLY
BACTERIAL VAGINITIS: POSITIVE — AB
Candida vaginitis: NEGATIVE
Chlamydia: POSITIVE — AB
Neisseria Gonorrhea: NEGATIVE
Trichomonas: NEGATIVE

## 2017-07-29 NOTE — Progress Notes (Signed)
Subjective:   Patient ID: Victoria Robinson, female   DOB: 32 y.o.   MRN: [id number]   HPI patient presents stating both her heels are hurting with the right hurting more than the left   ROS      Objective:  Physical Exam  Neurovascular status intact with inflammation pain in the plantar heel region right over left with fluid buildup around the medial band     Assessment:  Acute plantar fasciitis right and left heel with fluid buildup around the medial band     Plan:  Reviewed condition continued physical therapy shoe gear modifications and injected the plantar fascial bilateral 3 mg Kenalog 5 mg Xylocaine and instructed for physical therapy

## 2017-07-30 MED ORDER — METRONIDAZOLE 500 MG PO TABS: 500.0000 mg | ORAL_TABLET | Freq: Two times a day (BID) | ORAL | 0 refills | Status: DC

## 2017-07-30 MED ORDER — AZITHROMYCIN 500 MG PO TABS: 1000.0000 mg | ORAL_TABLET | Freq: Once | ORAL | 0 refills | Status: AC

## 2017-07-30 NOTE — Addendum Note (Signed)
Addended by: Leroy LibmanAVIS, KELLY on: 07/30/2017 11:48 AM   Modules accepted: Orders, SmartSet

## 2017-07-31 ENCOUNTER — Telehealth: Payer: Self-pay

## 2017-07-31 NOTE — Telephone Encounter (Signed)
Attempted to contact about results, and rx sent, no answer, left vm. Form faxed to [facility]

## 2017-08-07 ENCOUNTER — Inpatient Hospital Stay: Admission: RE | Admit: 2017-08-07 | Payer: BLUE CROSS/BLUE SHIELD | Source: Ambulatory Visit

## 2017-10-09 ENCOUNTER — Ambulatory Visit
Admission: RE | Admit: 2017-10-09 | Discharge: 2017-10-09 | Disposition: A | Discharge status: Home / Self Care | Payer: BLUE CROSS/BLUE SHIELD | Source: Ambulatory Visit | Attending: Obstetrics and Gynecology | Admitting: Obstetrics and Gynecology

## 2017-10-09 DIAGNOSIS — R102 Pelvic and perineal pain: Secondary | ICD-10-CM

## 2017-10-09 DIAGNOSIS — N888 Other specified noninflammatory disorders of cervix uteri: Secondary | ICD-10-CM | POA: Diagnosis not present

## 2017-10-29 ENCOUNTER — Ambulatory Visit (INDEPENDENT_AMBULATORY_CARE_PROVIDER_SITE_OTHER): Payer: BLUE CROSS/BLUE SHIELD

## 2017-10-29 ENCOUNTER — Other Ambulatory Visit: Payer: Self-pay

## 2017-10-29 VITALS — BP 125/85 | HR 85 | Ht 61.0 in | Wt 256.6 lb

## 2017-10-29 DIAGNOSIS — Z3201 Encounter for pregnancy test, result positive: Secondary | ICD-10-CM | POA: Diagnosis not present

## 2017-10-29 LAB — POCT URINE PREGNANCY: Preg Test, Ur: POSITIVE — AB

## 2017-10-29 NOTE — Progress Notes (Signed)
Chart reviewed for nurse visit. Agree with plan of care.   Sharyon Cable, CNM 10/29/2017 4:32 PM

## 2017-10-29 NOTE — Progress Notes (Signed)
Victoria Robinson presents today for UPT. She has no unusual complaints . LMP:09/29/17 (approx) EDD 07/06/18     OBJECTIVE: Appears well, in no apparent distress.  OB History    Gravida  3   Para  2   Term  2   Preterm      AB  1   Living  2     SAB  1   TAB      Ectopic      Multiple      Live Births  2          Home UPT Result: Positive In-Office UPT result:  POSITIVE I have reviewed the patient's medical, obstetrical, social, and family histories, and medications.   ASSESSMENT: Positive pregnancy test  PLAN Prenatal care to be completed at: [facility].

## 2017-12-09 ENCOUNTER — Ambulatory Visit (INDEPENDENT_AMBULATORY_CARE_PROVIDER_SITE_OTHER): Payer: BLUE CROSS/BLUE SHIELD | Admitting: Obstetrics and Gynecology

## 2017-12-09 ENCOUNTER — Encounter: Payer: Self-pay | Admitting: Obstetrics and Gynecology

## 2017-12-09 DIAGNOSIS — Z124 Encounter for screening for malignant neoplasm of cervix: Secondary | ICD-10-CM

## 2017-12-09 DIAGNOSIS — Z1151 Encounter for screening for human papillomavirus (HPV): Secondary | ICD-10-CM

## 2017-12-09 DIAGNOSIS — Z23 Encounter for immunization: Secondary | ICD-10-CM

## 2017-12-09 DIAGNOSIS — N898 Other specified noninflammatory disorders of vagina: Secondary | ICD-10-CM | POA: Diagnosis not present

## 2017-12-09 DIAGNOSIS — Z3480 Encounter for supervision of other normal pregnancy, unspecified trimester: Secondary | ICD-10-CM | POA: Insufficient documentation

## 2017-12-09 DIAGNOSIS — O98519 Other viral diseases complicating pregnancy, unspecified trimester: Secondary | ICD-10-CM

## 2017-12-09 DIAGNOSIS — Z113 Encounter for screening for infections with a predominantly sexual mode of transmission: Secondary | ICD-10-CM

## 2017-12-09 DIAGNOSIS — Z3481 Encounter for supervision of other normal pregnancy, first trimester: Secondary | ICD-10-CM | POA: Diagnosis not present

## 2017-12-09 DIAGNOSIS — O98511 Other viral diseases complicating pregnancy, first trimester: Secondary | ICD-10-CM

## 2017-12-09 DIAGNOSIS — B0089 Other herpesviral infection: Secondary | ICD-10-CM | POA: Insufficient documentation

## 2017-12-09 DIAGNOSIS — Z6841 Body Mass Index (BMI) 40.0 and over, adult: Secondary | ICD-10-CM

## 2017-12-09 DIAGNOSIS — Z8619 Personal history of other infectious and parasitic diseases: Secondary | ICD-10-CM

## 2017-12-09 DIAGNOSIS — B009 Herpesviral infection, unspecified: Secondary | ICD-10-CM

## 2017-12-09 MED ORDER — ACYCLOVIR 400 MG PO TABS: 400.0000 mg | ORAL_TABLET | Freq: Every day | ORAL | 11 refills | Status: DC

## 2017-12-09 MED ORDER — VALACYCLOVIR HCL 500 MG PO TABS: 500.0000 mg | ORAL_TABLET | Freq: Two times a day (BID) | ORAL | 6 refills | Status: DC

## 2017-12-09 NOTE — Progress Notes (Signed)
  Subjective:    Victoria Robinson is a [id number] [redacted]w[redacted]d being seen today for her first obstetrical visit.  Her obstetrical history is significant for obesity. Patient does intend to breast feed. Pregnancy history fully reviewed.  Patient reports nausea.  Vitals:   12/09/17 1532  BP: 120/81  Pulse: 78  Weight: 257 lb 12.8 oz (116.9 kg)    HISTORY: OB History  Gravida Para Term Preterm AB Living  4 2 2   1 2   SAB TAB Ectopic Multiple Live Births  1       2    # Outcome Date GA Lbr Len/2nd Weight Sex Delivery Anes PTL Lv  4 Current           3 Term 02/03/13 [redacted]w[redacted]d 09:27 / 00:21 8 lb 2 oz (3.685 kg) M Vag-Spont EPI  LIV  2 SAB 2014          1 Term 2010 [redacted]w[redacted]d   M Vag-Spont EPI N LIV   Past Medical History:  Diagnosis Date  . Abnormal Pap smear    repeat WNL  . Asthma childhood  . Shortness of breath    Past Surgical History:  Procedure Laterality Date  . APPENDECTOMY     Family History  Problem Relation Age of Onset  . Hyperlipidemia Mother   . Hyperlipidemia Father   . Hypertension Father   . Diabetes Maternal Grandmother   . Diabetes Paternal Grandmother      Exam    Uterus:     Pelvic Exam:    Perineum: No Hemorrhoids, Normal Perineum   Vulva: normal   Vagina:  normal mucosa, normal discharge   pH:    Cervix: multiparous appearance and cervix is closed and long   Adnexa: normal adnexa and no mass, fullness, tenderness   Bony Pelvis: gynecoid  System: Breast:  normal appearance, no masses or tenderness   Skin: normal coloration and turgor, no rashes    Neurologic: oriented, no focal deficits   Extremities: normal strength, tone, and muscle mass   HEENT extra ocular movement intact   Mouth/Teeth mucous membranes moist, pharynx normal without lesions and dental hygiene good   Neck supple and no masses   Cardiovascular: regular rate and rhythm   Respiratory:  appears well, vitals normal, no respiratory distress, acyanotic, normal RR, chest clear, no  wheezing, crepitations, rhonchi, normal symmetric air entry   Abdomen: soft, non-tender; bowel sounds normal; no masses,  no organomegaly   Urinary:       Assessment:    Pregnancy: [id number] Patient Active Problem List   Diagnosis Date Noted  . Supervision of other normal pregnancy, antepartum 12/09/2017  . 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 History of chlamydia 12/09/2017  . Obesity 12/27/2012        Plan:     Initial labs drawn. Prenatal vitamins. Problem list reviewed and updated. Genetic Screening discussed : Panorama ordered.  Ultrasound discussed; fetal survey: ordered.  Follow up in 8 weeks. Patient opted for BRx optimized schedule 50% of 30 min visit spent on counseling and coordination of care.     Herman Mell 12/09/2017

## 2017-12-09 NOTE — Progress Notes (Signed)
Pt presents for initial NOB and is living with her husband.  Pt agrees to BRx opt scheduled. Pt has no complaints today. Flu vaccine today.  Needs rf on Acyclovir gets HSV 2 breakouts on hands

## 2017-12-09 NOTE — Patient Instructions (Signed)
 First Trimester of Pregnancy The first trimester of pregnancy is from week 1 until the end of week 13 (months 1 through 3). A week after a sperm fertilizes an egg, the egg will implant on the wall of the uterus. This embryo will begin to develop into a baby. Genes from you and your partner will form the baby. The female genes will determine whether the baby will be a boy or a girl. At 6-8 weeks, the eyes and face will be formed, and the heartbeat can be seen on ultrasound. At the end of 12 weeks, all the baby's organs will be formed. Now that you are pregnant, you will want to do everything you can to have a healthy baby. Two of the most important things are to get good prenatal care and to follow your health care provider's instructions. Prenatal care is all the medical care you receive before the baby's birth. This care will help prevent, find, and treat any problems during the pregnancy and childbirth. Body changes during your first trimester Your body goes through many changes during pregnancy. The changes vary from woman to woman.  You may gain or lose a couple of pounds at first.  You may feel sick to your stomach (nauseous) and you may throw up (vomit). If the vomiting is uncontrollable, call your health care provider.  You may tire easily.  You may develop headaches that can be relieved by medicines. All medicines should be approved by your health care provider.  You may urinate more often. Painful urination may mean you have a bladder infection.  You may develop heartburn as a result of your pregnancy.  You may develop constipation because certain hormones are causing the muscles that push stool through your intestines to slow down.  You may develop hemorrhoids or swollen veins (varicose veins).  Your breasts may begin to grow larger and become tender. Your nipples may stick out more, and the tissue that surrounds them (areola) may become darker.  Your gums may bleed and may be  sensitive to brushing and flossing.  Dark spots or blotches (chloasma, mask of pregnancy) may develop on your face. This will likely fade after the baby is born.  Your menstrual periods will stop.  You may have a loss of appetite.  You may develop cravings for certain kinds of food.  You may have changes in your emotions from day to day, such as being excited to be pregnant or being concerned that something may go wrong with the pregnancy and baby.  You may have more vivid and strange dreams.  You may have changes in your hair. These can include thickening of your hair, rapid growth, and changes in texture. Some women also have hair loss during or after pregnancy, or hair that feels dry or thin. Your hair will most likely return to normal after your baby is born.  What to expect at prenatal visits During a routine prenatal visit:  You will be weighed to make sure you and the baby are growing normally.  Your blood pressure will be taken.  Your abdomen will be measured to track your baby's growth.  The fetal heartbeat will be listened to between weeks 10 and 14 of your pregnancy.  Test results from any previous visits will be discussed.  Your health care provider may ask you:  How you are feeling.  If you are feeling the baby move.  If you have had any abnormal symptoms, such as leaking fluid, bleeding, severe   headaches, or abdominal cramping.  If you are using any tobacco products, including cigarettes, chewing tobacco, and electronic cigarettes.  If you have any questions.  Other tests that may be performed during your first trimester include:  Blood tests to find your blood type and to check for the presence of any previous infections. The tests will also be used to check for low iron levels (anemia) and protein on red blood cells (Rh antibodies). Depending on your risk factors, or if you previously had diabetes during pregnancy, you may have tests to check for high blood  sugar that affects pregnant women (gestational diabetes).  Urine tests to check for infections, diabetes, or protein in the urine.  An ultrasound to confirm the proper growth and development of the baby.  Fetal screens for spinal cord problems (spina bifida) and Down syndrome.  HIV (human immunodeficiency virus) testing. Routine prenatal testing includes screening for HIV, unless you choose not to have this test.  You may need other tests to make sure you and the baby are doing well.  Follow these instructions at home: Medicines  Follow your health care provider's instructions regarding medicine use. Specific medicines may be either safe or unsafe to take during pregnancy.  Take a prenatal vitamin that contains at least 600 micrograms (mcg) of folic acid.  If you develop constipation, try taking a stool softener if your health care provider approves. Eating and drinking  Eat a balanced diet that includes fresh fruits and vegetables, whole grains, good sources of protein such as meat, eggs, or tofu, and low-fat dairy. Your health care provider will help you determine the amount of weight gain that is right for you.  Avoid raw meat and uncooked cheese. These carry germs that can cause birth defects in the baby.  Eating four or five small meals rather than three large meals a day may help relieve nausea and vomiting. If you start to feel nauseous, eating a few soda crackers can be helpful. Drinking liquids between meals, instead of during meals, also seems to help ease nausea and vomiting.  Limit foods that are high in fat and processed sugars, such as fried and sweet foods.  To prevent constipation: ? Eat foods that are high in fiber, such as fresh fruits and vegetables, whole grains, and beans. ? Drink enough fluid to keep your urine clear or pale yellow. Activity  Exercise only as directed by your health care provider. Most women can continue their usual exercise routine during  pregnancy. Try to exercise for 30 minutes at least 5 days a week. Exercising will help you: ? Control your weight. ? Stay in shape. ? Be prepared for labor and delivery.  Experiencing pain or cramping in the lower abdomen or lower back is a good sign that you should stop exercising. Check with your health care provider before continuing with normal exercises.  Try to avoid standing for long periods of time. Move your legs often if you must stand in one place for a long time.  Avoid heavy lifting.  Wear low-heeled shoes and practice good posture.  You may continue to have sex unless your health care provider tells you not to. Relieving pain and discomfort  Wear a good support bra to relieve breast tenderness.  Take warm sitz baths to soothe any pain or discomfort caused by hemorrhoids. Use hemorrhoid cream if your health care provider approves.  Rest with your legs elevated if you have leg cramps or low back pain.  If you   develop varicose veins in your legs, wear support hose. Elevate your feet for 15 minutes, 3-4 times a day. Limit salt in your diet. Prenatal care  Schedule your prenatal visits by the twelfth week of pregnancy. They are usually scheduled monthly at first, then more often in the last 2 months before delivery.  Write down your questions. Take them to your prenatal visits.  Keep all your prenatal visits as told by your health care provider. This is important. Safety  Wear your seat belt at all times when driving.  Make a list of emergency phone numbers, including numbers for family, friends, the hospital, and police and fire departments. General instructions  Ask your health care provider for a referral to a local prenatal education class. Begin classes no later than the beginning of month 6 of your pregnancy.  Ask for help if you have counseling or nutritional needs during pregnancy. Your health care provider can offer advice or refer you to specialists for help  with various needs.  Do not use hot tubs, steam rooms, or saunas.  Do not douche or use tampons or scented sanitary pads.  Do not cross your legs for long periods of time.  Avoid cat litter boxes and soil used by cats. These carry germs that can cause birth defects in the baby and possibly loss of the fetus by miscarriage or stillbirth.  Avoid all smoking, herbs, alcohol, and medicines not prescribed by your health care provider. Chemicals in these products affect the formation and growth of the baby.  Do not use any products that contain nicotine or tobacco, such as cigarettes and e-cigarettes. If you need help quitting, ask your health care provider. You may receive counseling support and other resources to help you quit.  Schedule a dentist appointment. At home, brush your teeth with a soft toothbrush and be gentle when you floss. Contact a health care provider if:  You have dizziness.  You have mild pelvic cramps, pelvic pressure, or nagging pain in the abdominal area.  You have persistent nausea, vomiting, or diarrhea.  You have a bad smelling vaginal discharge.  You have pain when you urinate.  You notice increased swelling in your face, hands, legs, or ankles.  You are exposed to fifth disease or chickenpox.  You are exposed to German measles (rubella) and have never had it. Get help right away if:  You have a fever.  You are leaking fluid from your vagina.  You have spotting or bleeding from your vagina.  You have severe abdominal cramping or pain.  You have rapid weight gain or loss.  You vomit blood or material that looks like coffee grounds.  You develop a severe headache.  You have shortness of breath.  You have any kind of trauma, such as from a fall or a car accident. Summary  The first trimester of pregnancy is from week 1 until the end of week 13 (months 1 through 3).  Your body goes through many changes during pregnancy. The changes vary from  woman to woman.  You will have routine prenatal visits. During those visits, your health care provider will examine you, discuss any test results you may have, and talk with you about how you are feeling. This information is not intended to replace advice given to you by your health care provider. Make sure you discuss any questions you have with your health care provider. Document Released: 01/14/2001 Document Revised: 01/02/2016 Document Reviewed: 01/02/2016 [employer] Interactive Patient Education  2018   [employer]   Second Trimester of Pregnancy The second trimester is from week 14 through week 27 (months 4 through 6). The second trimester is often a time when you feel your best. Your body has adjusted to being pregnant, and you begin to feel better physically. Usually, morning sickness has lessened or quit completely, you may have more energy, and you may have an increase in appetite. The second trimester is also a time when the fetus is growing rapidly. At the end of the sixth month, the fetus is about 9 inches long and weighs about 1 pounds. You will likely begin to feel the baby move (quickening) between 16 and 20 weeks of pregnancy. Body changes during your second trimester Your body continues to go through many changes during your second trimester. The changes vary from woman to woman.  Your weight will continue to increase. You will notice your lower abdomen bulging out.  You may begin to get stretch marks on your hips, abdomen, and breasts.  You may develop headaches that can be relieved by medicines. The medicines should be approved by your health care provider.  You may urinate more often because the fetus is pressing on your bladder.  You may develop or continue to have heartburn as a result of your pregnancy.  You may develop constipation because certain hormones are causing the muscles that push waste through your intestines to slow down.  You may develop hemorrhoids or  swollen, bulging veins (varicose veins).  You may have back pain. This is caused by: ? Weight gain. ? Pregnancy hormones that are relaxing the joints in your pelvis. ? A shift in weight and the muscles that support your balance.  Your breasts will continue to grow and they will continue to become tender.  Your gums may bleed and may be sensitive to brushing and flossing.  Dark spots or blotches (chloasma, mask of pregnancy) may develop on your face. This will likely fade after the baby is born.  A dark line from your belly button to the pubic area (linea nigra) may appear. This will likely fade after the baby is born.  You may have changes in your hair. These can include thickening of your hair, rapid growth, and changes in texture. Some women also have hair loss during or after pregnancy, or hair that feels dry or thin. Your hair will most likely return to normal after your baby is born.  What to expect at prenatal visits During a routine prenatal visit:  You will be weighed to make sure you and the fetus are growing normally.  Your blood pressure will be taken.  Your abdomen will be measured to track your baby's growth.  The fetal heartbeat will be listened to.  Any test results from the previous visit will be discussed.  Your health care provider may ask you:  How you are feeling.  If you are feeling the baby move.  If you have had any abnormal symptoms, such as leaking fluid, bleeding, severe headaches, or abdominal cramping.  If you are using any tobacco products, including cigarettes, chewing tobacco, and electronic cigarettes.  If you have any questions.  Other tests that may be performed during your second trimester include:  Blood tests that check for: ? Low iron levels (anemia). ? High blood sugar that affects pregnant women (gestational diabetes) between 24 and 28 weeks. ? Rh antibodies. This is to check for a protein on red blood cells (Rh factor).  Urine  tests to   check for infections, diabetes, or protein in the urine.  An ultrasound to confirm the proper growth and development of the baby.  An amniocentesis to check for possible genetic problems.  Fetal screens for spina bifida and Down syndrome.  HIV (human immunodeficiency virus) testing. Routine prenatal testing includes screening for HIV, unless you choose not to have this test.  Follow these instructions at home: Medicines  Follow your health care provider's instructions regarding medicine use. Specific medicines may be either safe or unsafe to take during pregnancy.  Take a prenatal vitamin that contains at least 600 micrograms (mcg) of folic acid.  If you develop constipation, try taking a stool softener if your health care provider approves. Eating and drinking  Eat a balanced diet that includes fresh fruits and vegetables, whole grains, good sources of protein such as meat, eggs, or tofu, and low-fat dairy. Your health care provider will help you determine the amount of weight gain that is right for you.  Avoid raw meat and uncooked cheese. These carry germs that can cause birth defects in the baby.  If you have low calcium intake from food, talk to your health care provider about whether you should take a daily calcium supplement.  Limit foods that are high in fat and processed sugars, such as fried and sweet foods.  To prevent constipation: ? Drink enough fluid to keep your urine clear or pale yellow. ? Eat foods that are high in fiber, such as fresh fruits and vegetables, whole grains, and beans. Activity  Exercise only as directed by your health care provider. Most women can continue their usual exercise routine during pregnancy. Try to exercise for 30 minutes at least 5 days a week. Stop exercising if you experience uterine contractions.  Avoid heavy lifting, wear low heel shoes, and practice good posture.  A sexual relationship may be continued unless your health  care provider directs you otherwise. Relieving pain and discomfort  Wear a good support bra to prevent discomfort from breast tenderness.  Take warm sitz baths to soothe any pain or discomfort caused by hemorrhoids. Use hemorrhoid cream if your health care provider approves.  Rest with your legs elevated if you have leg cramps or low back pain.  If you develop varicose veins, wear support hose. Elevate your feet for 15 minutes, 3-4 times a day. Limit salt in your diet. Prenatal Care  Write down your questions. Take them to your prenatal visits.  Keep all your prenatal visits as told by your health care provider. This is important. Safety  Wear your seat belt at all times when driving.  Make a list of emergency phone numbers, including numbers for family, friends, the hospital, and police and fire departments. General instructions  Ask your health care provider for a referral to a local prenatal education class. Begin classes no later than the beginning of month 6 of your pregnancy.  Ask for help if you have counseling or nutritional needs during pregnancy. Your health care provider can offer advice or refer you to specialists for help with various needs.  Do not use hot tubs, steam rooms, or saunas.  Do not douche or use tampons or scented sanitary pads.  Do not cross your legs for long periods of time.  Avoid cat litter boxes and soil used by cats. These carry germs that can cause birth defects in the baby and possibly loss of the fetus by miscarriage or stillbirth.  Avoid all smoking, herbs, alcohol, and unprescribed drugs. Chemicals   in these products can affect the formation and growth of the baby.  Do not use any products that contain nicotine or tobacco, such as cigarettes and e-cigarettes. If you need help quitting, ask your health care provider.  Visit your dentist if you have not gone yet during your pregnancy. Use a soft toothbrush to brush your teeth and be gentle when  you floss. Contact a health care provider if:  You have dizziness.  You have mild pelvic cramps, pelvic pressure, or nagging pain in the abdominal area.  You have persistent nausea, vomiting, or diarrhea.  You have a bad smelling vaginal discharge.  You have pain when you urinate. Get help right away if:  You have a fever.  You are leaking fluid from your vagina.  You have spotting or bleeding from your vagina.  You have severe abdominal cramping or pain.  You have rapid weight gain or weight loss.  You have shortness of breath with chest pain.  You notice sudden or extreme swelling of your face, hands, ankles, feet, or legs.  You have not felt your baby move in over an hour.  You have severe headaches that do not go away when you take medicine.  You have vision changes. Summary  The second trimester is from week 14 through week 27 (months 4 through 6). It is also a time when the fetus is growing rapidly.  Your body goes through many changes during pregnancy. The changes vary from woman to woman.  Avoid all smoking, herbs, alcohol, and unprescribed drugs. These chemicals affect the formation and growth your baby.  Do not use any tobacco products, such as cigarettes, chewing tobacco, and e-cigarettes. If you need help quitting, ask your health care provider.  Contact your health care provider if you have any questions. Keep all prenatal visits as told by your health care provider. This is important. This information is not intended to replace advice given to you by your health care provider. Make sure you discuss any questions you have with your health care provider. Document Released: 01/14/2001 Document Revised: 02/26/2016 Document Reviewed: 02/26/2016 [employer] Interactive Patient Education  2018 [employer]   Contraception Choices Contraception, also called birth control, refers to methods or devices that prevent pregnancy. Hormonal methods Contraceptive  implant A contraceptive implant is a thin, plastic tube that contains a hormone. It is inserted into the upper part of the arm. It can remain in place for up to 3 years. Progestin-only injections Progestin-only injections are injections of progestin, a synthetic form of the hormone progesterone. They are given every 3 months by a health care provider. Birth control pills Birth control pills are pills that contain hormones that prevent pregnancy. They must be taken once a day, preferably at the same time each day. Birth control patch The birth control patch contains hormones that prevent pregnancy. It is placed on the skin and must be changed once a week for three weeks and removed on the fourth week. A prescription is needed to use this method of contraception. Vaginal ring A vaginal ring contains hormones that prevent pregnancy. It is placed in the vagina for three weeks and removed on the fourth week. After that, the process is repeated with a new ring. A prescription is needed to use this method of contraception. Emergency contraceptive Emergency contraceptives prevent pregnancy after unprotected sex. They come in pill form and can be taken up to 5 days after sex. They work best the sooner they are taken after having   sex. Most emergency contraceptives are available without a prescription. This method should not be used as your only form of birth control. Barrier methods Female condom A female condom is a thin sheath that is worn over the penis during sex. Condoms keep sperm from going inside a woman's body. They can be used with a spermicide to increase their effectiveness. They should be disposed after a single use. Female condom A female condom is a soft, loose-fitting sheath that is put into the vagina before sex. The condom keeps sperm from going inside a woman's body. They should be disposed after a single use. Diaphragm A diaphragm is a soft, dome-shaped barrier. It is inserted into the vagina  before sex, along with a spermicide. The diaphragm blocks sperm from entering the uterus, and the spermicide kills sperm. A diaphragm should be left in the vagina for 6-8 hours after sex and removed within 24 hours. A diaphragm is prescribed and fitted by a health care provider. A diaphragm should be replaced every 1-2 years, after giving birth, after gaining more than 15 lb (6.8 kg), and after pelvic surgery. Cervical cap A cervical cap is a round, soft latex or plastic cup that fits over the cervix. It is inserted into the vagina before sex, along with spermicide. It blocks sperm from entering the uterus. The cap should be left in place for 6-8 hours after sex and removed within 48 hours. A cervical cap must be prescribed and fitted by a health care provider. It should be replaced every 2 years. Sponge A sponge is a soft, circular piece of polyurethane foam with spermicide on it. The sponge helps block sperm from entering the uterus, and the spermicide kills sperm. To use it, you make it wet and then insert it into the vagina. It should be inserted before sex, left in for at least 6 hours after sex, and removed and thrown away within 30 hours. Spermicides Spermicides are chemicals that kill or block sperm from entering the cervix and uterus. They can come as a cream, jelly, suppository, foam, or tablet. A spermicide should be inserted into the vagina with an applicator at least 10-15 minutes before sex to allow time for it to work. The process must be repeated every time you have sex. Spermicides do not require a prescription. Intrauterine contraception Intrauterine device (IUD) An IUD is a T-shaped device that is put in a woman's uterus. There are two types:  Hormone IUD.This type contains progestin, a synthetic form of the hormone progesterone. This type can stay in place for 3-5 years.  Copper IUD.This type is wrapped in copper wire. It can stay in place for 10 years.  Permanent methods of  contraception Female tubal ligation In this method, a woman's fallopian tubes are sealed, tied, or blocked during surgery to prevent eggs from traveling to the uterus. Hysteroscopic sterilization In this method, a small, flexible insert is placed into each fallopian tube. The inserts cause scar tissue to form in the fallopian tubes and block them, so sperm cannot reach an egg. The procedure takes about 3 months to be effective. Another form of birth control must be used during those 3 months. Female sterilization This is a procedure to tie off the tubes that carry sperm (vasectomy). After the procedure, the man can still ejaculate fluid (semen). Natural planning methods Natural family planning In this method, a couple does not have sex on days when the woman could become pregnant. Calendar method This means keeping track of the   length of each menstrual cycle, identifying the days when pregnancy can happen, and not having sex on those days. Ovulation method In this method, a couple avoids sex during ovulation. Symptothermal method This method involves not having sex during ovulation. The woman typically checks for ovulation by watching changes in her temperature and in the consistency of cervical mucus. Post-ovulation method In this method, a couple waits to have sex until after ovulation. Summary  Contraception, also called birth control, means methods or devices that prevent pregnancy.  Hormonal methods of contraception include implants, injections, pills, patches, vaginal rings, and emergency contraceptives.  Barrier methods of contraception can include female condoms, female condoms, diaphragms, cervical caps, sponges, and spermicides.  There are two types of IUDs (intrauterine devices). An IUD can be put in a woman's uterus to prevent pregnancy for 3-5 years.  Permanent sterilization can be done through a procedure for males, females, or both.  Natural family planning methods involve  not having sex on days when the woman could become pregnant. This information is not intended to replace advice given to you by your health care provider. Make sure you discuss any questions you have with your health care provider. Document Released: 01/20/2005 Document Revised: 02/23/2016 Document Reviewed: 02/23/2016 Elsevier Interactive Patient Education  2018 [employer]   Breastfeeding Choosing to breastfeed is one of the best decisions you can make for yourself and your baby. A change in hormones during pregnancy causes your breasts to make breast milk in your milk-producing glands. Hormones prevent breast milk from being released before your baby is born. They also prompt milk flow after birth. Once breastfeeding has begun, thoughts of your baby, as well as his or her sucking or crying, can stimulate the release of milk from your milk-producing glands. Benefits of breastfeeding Research shows that breastfeeding offers many health benefits for infants and mothers. It also offers a cost-free and convenient way to feed your baby. For your baby  Your first milk (colostrum) helps your baby's digestive system to function better.  Special cells in your milk (antibodies) help your baby to fight off infections.  Breastfed babies are less likely to develop asthma, allergies, obesity, or type 2 diabetes. They are also at lower risk for sudden infant death syndrome (SIDS).  Nutrients in breast milk are better able to meet your baby's needs compared to infant formula.  Breast milk improves your baby's brain development. For you  Breastfeeding helps to create a very special bond between you and your baby.  Breastfeeding is convenient. Breast milk costs nothing and is always available at the correct temperature.  Breastfeeding helps to burn calories. It helps you to lose the weight that you gained during pregnancy.  Breastfeeding makes your uterus return faster to its size before pregnancy.  It also slows bleeding (lochia) after you give birth.  Breastfeeding helps to lower your risk of developing type 2 diabetes, osteoporosis, rheumatoid arthritis, cardiovascular disease, and breast, ovarian, uterine, and endometrial cancer later in life. Breastfeeding basics Starting breastfeeding  Find a comfortable place to sit or lie down, with your neck and back well-supported.  Place a pillow or a rolled-up blanket under your baby to bring him or her to the level of your breast (if you are seated). Nursing pillows are specially designed to help support your arms and your baby while you breastfeed.  Make sure that your baby's tummy (abdomen) is facing your abdomen.  Gently massage your breast. With your fingertips, massage from the outer edges of   your breast inward toward the nipple. This encourages milk flow. If your milk flows slowly, you may need to continue this action during the feeding.  Support your breast with 4 fingers underneath and your thumb above your nipple (make the letter "C" with your hand). Make sure your fingers are well away from your nipple and your baby's mouth.  Stroke your baby's lips gently with your finger or nipple.  When your baby's mouth is open wide enough, quickly bring your baby to your breast, placing your entire nipple and as much of the areola as possible into your baby's mouth. The areola is the colored area around your nipple. ? More areola should be visible above your baby's upper lip than below the lower lip. ? Your baby's lips should be opened and extended outward (flanged) to ensure an adequate, comfortable latch. ? Your baby's tongue should be between his or her lower gum and your breast.  Make sure that your baby's mouth is correctly positioned around your nipple (latched). Your baby's lips should create a seal on your breast and be turned out (everted).  It is common for your baby to suck about 2-3 minutes in order to start the flow of breast  milk. Latching Teaching your baby how to latch onto your breast properly is very important. An improper latch can cause nipple pain, decreased milk supply, and poor weight gain in your baby. Also, if your baby is not latched onto your nipple properly, he or she may swallow some air during feeding. This can make your baby fussy. Burping your baby when you switch breasts during the feeding can help to get rid of the air. However, teaching your baby to latch on properly is still the best way to prevent fussiness from swallowing air while breastfeeding. Signs that your baby has successfully latched onto your nipple  Silent tugging or silent sucking, without causing you pain. Infant's lips should be extended outward (flanged).  Swallowing heard between every 3-4 sucks once your milk has started to flow (after your let-down milk reflex occurs).  Muscle movement above and in front of his or her ears while sucking.  Signs that your baby has not successfully latched onto your nipple  Sucking sounds or smacking sounds from your baby while breastfeeding.  Nipple pain.  If you think your baby has not latched on correctly, slip your finger into the corner of your baby's mouth to break the suction and place it between your baby's gums. Attempt to start breastfeeding again. Signs of successful breastfeeding Signs from your baby  Your baby will gradually decrease the number of sucks or will completely stop sucking.  Your baby will fall asleep.  Your baby's body will relax.  Your baby will retain a small amount of milk in his or her mouth.  Your baby will let go of your breast by himself or herself.  Signs from you  Breasts that have increased in firmness, weight, and size 1-3 hours after feeding.  Breasts that are softer immediately after breastfeeding.  Increased milk volume, as well as a change in milk consistency and color by the fifth day of breastfeeding.  Nipples that are not sore,  cracked, or bleeding.  Signs that your baby is getting enough milk  Wetting at least 1-2 diapers during the first 24 hours after birth.  Wetting at least 5-6 diapers every 24 hours for the first week after birth. The urine should be clear or pale yellow by the age of 5   days.  Wetting 6-8 diapers every 24 hours as your baby continues to grow and develop.  At least 3 stools in a 24-hour period by the age of 5 days. The stool should be soft and yellow.  At least 3 stools in a 24-hour period by the age of 7 days. The stool should be seedy and yellow.  No loss of weight greater than 10% of birth weight during the first 3 days of life.  Average weight gain of 4-7 oz (113-198 g) per week after the age of 4 days.  Consistent daily weight gain by the age of 5 days, without weight loss after the age of 2 weeks. After a feeding, your baby may spit up a small amount of milk. This is normal. Breastfeeding frequency and duration Frequent feeding will help you make more milk and can prevent sore nipples and extremely full breasts (breast engorgement). Breastfeed when you feel the need to reduce the fullness of your breasts or when your baby shows signs of hunger. This is called "breastfeeding on demand." Signs that your baby is hungry include:  Increased alertness, activity, or restlessness.  Movement of the head from side to side.  Opening of the mouth when the corner of the mouth or cheek is stroked (rooting).  Increased sucking sounds, smacking lips, cooing, sighing, or squeaking.  Hand-to-mouth movements and sucking on fingers or hands.  Fussing or crying.  Avoid introducing a pacifier to your baby in the first 4-6 weeks after your baby is born. After this time, you may choose to use a pacifier. Research has shown that pacifier use during the first year of a baby's life decreases the risk of sudden infant death syndrome (SIDS). Allow your baby to feed on each breast as long as he or she  wants. When your baby unlatches or falls asleep while feeding from the first breast, offer the second breast. Because newborns are often sleepy in the first few weeks of life, you may need to awaken your baby to get him or her to feed. Breastfeeding times will vary from baby to baby. However, the following rules can serve as a guide to help you make sure that your baby is properly fed:  Newborns (babies 4 weeks of age or younger) may breastfeed every 1-3 hours.  Newborns should not go without breastfeeding for longer than 3 hours during the day or 5 hours during the night.  You should breastfeed your baby a minimum of 8 times in a 24-hour period.  Breast milk pumping Pumping and storing breast milk allows you to make sure that your baby is exclusively fed your breast milk, even at times when you are unable to breastfeed. This is especially important if you go back to work while you are still breastfeeding, or if you are not able to be present during feedings. Your lactation consultant can help you find a method of pumping that works best for you and give you guidelines about how long it is safe to store breast milk. Caring for your breasts while you breastfeed Nipples can become dry, cracked, and sore while breastfeeding. The following recommendations can help keep your breasts moisturized and healthy:  Avoid using soap on your nipples.  Wear a supportive bra designed especially for nursing. Avoid wearing underwire-style bras or extremely tight bras (sports bras).  Air-dry your nipples for 3-4 minutes after each feeding.  Use only cotton bra pads to absorb leaked breast milk. Leaking of breast milk between feedings is normal.    Use lanolin on your nipples after breastfeeding. Lanolin helps to maintain your skin's normal moisture barrier. Pure lanolin is not harmful (not toxic) to your baby. You may also hand express a few drops of breast milk and gently massage that milk into your nipples and  allow the milk to air-dry.  In the first few weeks after giving birth, some women experience breast engorgement. Engorgement can make your breasts feel heavy, warm, and tender to the touch. Engorgement peaks within 3-5 days after you give birth. The following recommendations can help to ease engorgement:  Completely empty your breasts while breastfeeding or pumping. You may want to start by applying warm, moist heat (in the shower or with warm, water-soaked hand towels) just before feeding or pumping. This increases circulation and helps the milk flow. If your baby does not completely empty your breasts while breastfeeding, pump any extra milk after he or she is finished.  Apply ice packs to your breasts immediately after breastfeeding or pumping, unless this is too uncomfortable for you. To do this: ? Put ice in a plastic bag. ? Place a towel between your skin and the bag. ? Leave the ice on for 20 minutes, 2-3 times a day.  Make sure that your baby is latched on and positioned properly while breastfeeding.  If engorgement persists after 48 hours of following these recommendations, contact your health care provider or a lactation consultant. Overall health care recommendations while breastfeeding  Eat 3 healthy meals and 3 snacks every day. Well-nourished mothers who are breastfeeding need an additional 450-500 calories a day. You can meet this requirement by increasing the amount of a balanced diet that you eat.  Drink enough water to keep your urine pale yellow or clear.  Rest often, relax, and continue to take your prenatal vitamins to prevent fatigue, stress, and low vitamin and mineral levels in your body (nutrient deficiencies).  Do not use any products that contain nicotine or tobacco, such as cigarettes and e-cigarettes. Your baby may be harmed by chemicals from cigarettes that pass into breast milk and exposure to secondhand smoke. If you need help quitting, ask your health care  provider.  Avoid alcohol.  Do not use illegal drugs or marijuana.  Talk with your health care provider before taking any medicines. These include over-the-counter and prescription medicines as well as vitamins and herbal supplements. Some medicines that may be harmful to your baby can pass through breast milk.  It is possible to become pregnant while breastfeeding. If birth control is desired, ask your health care provider about options that will be safe while breastfeeding your baby. Where to find more information: [employer]: www.llli.org Contact a health care provider if:  You feel like you want to stop breastfeeding or have become frustrated with breastfeeding.  Your nipples are cracked or bleeding.  Your breasts are red, tender, or warm.  You have: ? Painful breasts or nipples. ? A swollen area on either breast. ? A fever or chills. ? Nausea or vomiting. ? Drainage other than breast milk from your nipples.  Your breasts do not become full before feedings by the fifth day after you give birth.  You feel sad and depressed.  Your baby is: ? Too sleepy to eat well. ? Having trouble sleeping. ? More than 1 week old and wetting fewer than 6 diapers in a 24-hour period. ? Not gaining weight by 5 days of age.  Your baby has fewer than 3 stools in a   24-hour period.  Your baby's skin or the white parts of his or her eyes become yellow. Get help right away if:  Your baby is overly tired (lethargic) and does not want to wake up and feed.  Your baby develops an unexplained fever. Summary  Breastfeeding offers many health benefits for infant and mothers.  Try to breastfeed your infant when he or she shows early signs of hunger.  Gently tickle or stroke your baby's lips with your finger or nipple to allow the baby to open his or her mouth. Bring the baby to your breast. Make sure that much of the areola is in your baby's mouth. Offer one side and burp the  baby before you offer the other side.  Talk with your health care provider or lactation consultant if you have questions or you face problems as you breastfeed. This information is not intended to replace advice given to you by your health care provider. Make sure you discuss any questions you have with your health care provider. Document Released: 01/20/2005 Document Revised: 02/22/2016 Document Reviewed: 02/22/2016 Elsevier Interactive Patient Education  2018 [employer] Inc.  

## 2017-12-10 LAB — OBSTETRIC PANEL, INCLUDING HIV
ANTIBODY SCREEN: NEGATIVE
BASOS: 0 %
Basophils Absolute: 0 10*3/uL (ref 0.0–0.2)
EOS (ABSOLUTE): 0.1 10*3/uL (ref 0.0–0.4)
Eos: 1 %
HEMATOCRIT: 35.3 % (ref 34.0–46.6)
HIV SCREEN 4TH GENERATION: NONREACTIVE
Hemoglobin: 11.8 g/dL (ref 11.1–15.9)
Hepatitis B Surface Ag: NEGATIVE
Immature Grans (Abs): 0 10*3/uL (ref 0.0–0.1)
Immature Granulocytes: 0 %
Lymphocytes Absolute: 2.3 10*3/uL (ref 0.7–3.1)
Lymphs: 31 %
MCH: 30.5 pg (ref 26.6–33.0)
MCHC: 33.4 g/dL (ref 31.5–35.7)
MCV: 91 fL (ref 79–97)
MONOCYTES: 7 %
Monocytes Absolute: 0.5 10*3/uL (ref 0.1–0.9)
NEUTROS ABS: 4.3 10*3/uL (ref 1.4–7.0)
Neutrophils: 61 %
Platelets: 238 10*3/uL (ref 150–450)
RBC: 3.87 x10E6/uL (ref 3.77–5.28)
RDW: 12.7 % (ref 12.3–15.4)
RPR: NONREACTIVE
RUBELLA: 4.84 {index} (ref >0.99)
Rh Factor: POSITIVE
WBC: 7.2 10*3/uL (ref 3.4–10.8)

## 2017-12-10 LAB — HEMOGLOBIN A1C
Est. average glucose Bld gHb Est-mCnc: 97 mg/dL
Hgb A1c MFr Bld: 5 % (ref 4.8–5.6)

## 2017-12-10 LAB — CERVICOVAGINAL ANCILLARY ONLY
BACTERIAL VAGINITIS: NEGATIVE
Candida vaginitis: NEGATIVE
Chlamydia: NEGATIVE
Neisseria Gonorrhea: NEGATIVE
TRICH (WINDOWPATH): NEGATIVE

## 2017-12-11 LAB — CYTOLOGY - PAP
Diagnosis: NEGATIVE
HPV: NOT DETECTED

## 2017-12-12 LAB — URINE CULTURE, OB REFLEX

## 2017-12-12 LAB — CULTURE, OB URINE

## 2017-12-19 LAB — SMN1 COPY NUMBER ANALYSIS (SMA CARRIER SCREENING)

## 2017-12-19 LAB — CYSTIC FIBROSIS MUTATION 97: GENE DIS ANAL CARRIER INTERP BLD/T-IMP: NOT DETECTED

## 2017-12-23 ENCOUNTER — Ambulatory Visit: Payer: BLUE CROSS/BLUE SHIELD | Admitting: *Deleted

## 2017-12-23 DIAGNOSIS — Z3480 Encounter for supervision of other normal pregnancy, unspecified trimester: Secondary | ICD-10-CM

## 2017-12-23 DIAGNOSIS — R399 Unspecified symptoms and signs involving the genitourinary system: Secondary | ICD-10-CM | POA: Diagnosis not present

## 2017-12-23 LAB — POCT URINALYSIS DIPSTICK
Bilirubin, UA: NEGATIVE
Glucose, UA: NEGATIVE
NITRITE UA: NEGATIVE
PH UA: 6 (ref 5.0–8.0)
PROTEIN UA: POSITIVE — AB
Spec Grav, UA: >=1.03 — AB (ref 1.010–1.025)
UROBILINOGEN UA: 0.2 U/dL

## 2017-12-23 MED ORDER — NITROFURANTOIN MONOHYD MACRO 100 MG PO CAPS: 100.0000 mg | ORAL_CAPSULE | Freq: Two times a day (BID) | ORAL | 0 refills | Status: DC

## 2017-12-23 NOTE — Progress Notes (Signed)
Agree with A & P. 

## 2017-12-23 NOTE — Progress Notes (Signed)
Pt came to office today to leave urine sample for dip/ culture.  Per Dr Alysia PennaErvin, ok to treat symptoms/results today with Macrobid 100mg  Pt made aware of results and Rx to be sent today.  Urine dipstick shows negative for all components, positive for leukocytes, red blood cells, protein, ketones. Urine culture sent.

## 2017-12-28 LAB — URINE CULTURE, OB REFLEX: Organism ID, Bacteria: NO GROWTH

## 2017-12-28 LAB — CULTURE, OB URINE

## 2017-12-29 ENCOUNTER — Telehealth: Payer: Self-pay

## 2017-12-29 NOTE — Telephone Encounter (Signed)
Contacted pt to remind to record BP in babys[facility], no answer, left vm.

## 2018-01-07 ENCOUNTER — Inpatient Hospital Stay (HOSPITAL_BASED_OUTPATIENT_CLINIC_OR_DEPARTMENT_OTHER): Payer: BLUE CROSS/BLUE SHIELD

## 2018-01-07 ENCOUNTER — Inpatient Hospital Stay (HOSPITAL_COMMUNITY)
Admission: AD | Admit: 2018-01-07 | Discharge: 2018-01-07 | Disposition: A | Discharge status: Home / Self Care | Payer: BLUE CROSS/BLUE SHIELD | Source: Ambulatory Visit | Attending: Obstetrics & Gynecology | Admitting: Obstetrics & Gynecology

## 2018-01-07 ENCOUNTER — Encounter (HOSPITAL_COMMUNITY): Payer: Self-pay | Admitting: *Deleted

## 2018-01-07 ENCOUNTER — Other Ambulatory Visit: Payer: Self-pay

## 2018-01-07 DIAGNOSIS — O99212 Obesity complicating pregnancy, second trimester: Secondary | ICD-10-CM | POA: Diagnosis not present

## 2018-01-07 DIAGNOSIS — R1031 Right lower quadrant pain: Secondary | ICD-10-CM | POA: Insufficient documentation

## 2018-01-07 DIAGNOSIS — Z3A14 14 weeks gestation of pregnancy: Secondary | ICD-10-CM | POA: Insufficient documentation

## 2018-01-07 DIAGNOSIS — Z87891 Personal history of nicotine dependence: Secondary | ICD-10-CM | POA: Diagnosis not present

## 2018-01-07 DIAGNOSIS — O26892 Other specified pregnancy related conditions, second trimester: Secondary | ICD-10-CM | POA: Diagnosis not present

## 2018-01-07 LAB — COMPREHENSIVE METABOLIC PANEL
ALK PHOS: 36 U/L — AB (ref 38–126)
ALT: 12 U/L (ref 0–44)
AST: 14 U/L — ABNORMAL LOW (ref 15–41)
Albumin: 3.7 g/dL (ref 3.5–5.0)
Anion gap: 9 (ref 5–15)
BUN: 7 mg/dL (ref 6–20)
CO2: 23 mmol/L (ref 22–32)
Calcium: 8.7 mg/dL — ABNORMAL LOW (ref 8.9–10.3)
Chloride: 103 mmol/L (ref 98–111)
Creatinine, Ser: 0.61 mg/dL (ref 0.44–1.00)
GFR calc Af Amer: >60 mL/min (ref >60)
GFR calc non Af Amer: >60 mL/min (ref >60)
Glucose, Bld: 89 mg/dL (ref 70–99)
Potassium: 3.5 mmol/L (ref 3.5–5.1)
Sodium: 135 mmol/L (ref 135–145)
Total Bilirubin: 0.2 mg/dL — ABNORMAL LOW (ref 0.3–1.2)
Total Protein: 7.2 g/dL (ref 6.5–8.1)

## 2018-01-07 LAB — URINALYSIS, ROUTINE W REFLEX MICROSCOPIC
BILIRUBIN URINE: NEGATIVE
GLUCOSE, UA: NEGATIVE mg/dL
HGB URINE DIPSTICK: NEGATIVE
Ketones, ur: 20 mg/dL — AB
Leukocytes, UA: NEGATIVE
Nitrite: NEGATIVE
PH: 5 (ref 5.0–8.0)
Protein, ur: NEGATIVE mg/dL
SPECIFIC GRAVITY, URINE: 1.023 (ref 1.005–1.030)

## 2018-01-07 LAB — LIPASE, BLOOD: Lipase: 20 U/L (ref 11–51)

## 2018-01-07 LAB — CBC
HCT: 34.3 % — ABNORMAL LOW (ref 36.0–46.0)
Hemoglobin: 11.2 g/dL — ABNORMAL LOW (ref 12.0–15.0)
MCH: 30.4 pg (ref 26.0–34.0)
MCHC: 32.7 g/dL (ref 30.0–36.0)
MCV: 93 fL (ref 80.0–100.0)
Platelets: 252 10*3/uL (ref 150–400)
RBC: 3.69 MIL/uL — ABNORMAL LOW (ref 3.87–5.11)
RDW: 13 % (ref 11.5–15.5)
WBC: 8.7 10*3/uL (ref 4.0–10.5)
nRBC: 0 % (ref 0.0–0.2)

## 2018-01-07 NOTE — MAU Provider Note (Signed)
Chief Complaint: Abdominal Pain   First Provider Initiated Contact with Patient 01/07/18 0126     SUBJECTIVE HPI: Victoria Robinson is a 32 y.o. [id number] at 4276w2d who presents [facility] reporting abdominal pain. Symptoms started around 5 pm today. Reports intermittent right sided abdominal pain. Pain occurs about 2 times per hour & lasts for several minutes at a time. Endorses some nausea with pain. Pain in RLQ, right side, and epigastric area. Pt has history of ovarian cyst & concerned pain is due to a cyst. Has had appendix removed.  Denies vomiting, diarrhea, vaginal bleeding, dysuria, hematuria, fever/chills.   Location: abdomen Quality: sharp & cramping Severity: 6/10 on pain scale Duration: <1 day Timing: 2x per hour Modifying factors: none Associated signs and symptoms: nausea  Past Medical History:  Diagnosis Date  . Abnormal Pap smear    repeat WNL  . Asthma childhood  . Shortness of breath    OB History  Gravida Para Term Preterm AB Living  4 2 2   1 2   SAB TAB Ectopic Multiple Live Births  1       2    # Outcome Date GA Lbr Len/2nd Weight Sex Delivery Anes PTL Lv  4 Current           3 Term 02/03/13 3525w3d 09:27 / 00:21 3685 g M Vag-Spont EPI  LIV  2 SAB 2014          1 Term 2010 3866w0d   M Vag-Spont EPI N LIV   Past Surgical History:  Procedure Laterality Date  . APPENDECTOMY     Social History   Socioeconomic History  . Marital status: Single    Spouse name: Not on file  . Number of children: Not on file  . Years of education: Not on file  . Highest education level: Not on file  Occupational History  . Not on file  Social Needs  . Financial resource strain: Not on file  . Food insecurity:    Worry: Not on file    Inability: Not on file  . Transportation needs:    Medical: Not on file    Non-medical: Not on file  Tobacco Use  . Smoking status: Former Smoker    Packs/day: 0.50    Types: Cigarettes  . Smokeless tobacco: Never Used   Substance and Sexual Activity  . Alcohol use: Yes    Comment: occasionally   . Drug use: No  . Sexual activity: Yes    Partners: Male    Birth control/protection: None  Lifestyle  . Physical activity:    Days per week: Not on file    Minutes per session: Not on file  . Stress: Not on file  Relationships  . Social connections:    Talks on phone: Not on file    Gets together: Not on file    Attends religious service: Not on file    Active member of club or organization: Not on file    Attends meetings of clubs or organizations: Not on file    Relationship status: Not on file  . Intimate partner violence:    Fear of current or ex partner: Not on file    Emotionally abused: Not on file    Physically abused: Not on file    Forced sexual activity: Not on file  Other Topics Concern  . Not on file  Social History Narrative  . Not on file   Family History  Problem Relation Age  of Onset  . Hyperlipidemia Mother   . Hyperlipidemia Father   . Hypertension Father   . Diabetes Maternal Grandmother   . Diabetes Paternal Grandmother    No current facility-administered medications on file prior to encounter.    Current Outpatient Medications on File Prior to Encounter  Medication Sig Dispense Refill  . acetaminophen (TYLENOL) 325 MG tablet Take 650 mg by mouth every 6 (six) hours as needed for moderate pain.    . Prenatal Vit w/Fe-Methylfol-FA (PNV PO) Take by mouth.    . ondansetron (ZOFRAN) 4 MG tablet Take 4 mg by mouth every 8 (eight) hours as needed for nausea or vomiting.     No Known Allergies  I have reviewed patient's Past Medical Hx, Surgical Hx, Family Hx, Social Hx, medications and allergies.   Review of Systems  Constitutional: Negative.   Gastrointestinal: Positive for abdominal pain, constipation and nausea. Negative for diarrhea and vomiting.  Genitourinary: Negative.     OBJECTIVE Patient Vitals for the past 24 hrs:  BP Temp Temp src Pulse Resp Height  Weight  01/07/18 0256 133/87 97.9 F (36.6 C) Oral 90 18 - -  01/07/18 0057 123/72 98.3 F (36.8 C) Oral 79 18 5\' 1"  (1.549 m) 117.7 kg   Constitutional: Well-developed, well-nourished female in no acute distress.  Cardiovascular: normal rate & rhythm, no murmur Respiratory: normal rate and effort. Lung sounds clear throughout GI: Abd soft, TTP RUQ to RLQ, Pos BS x 4. No guarding or rebound tenderness. No CVAT MS: Extremities nontender, no edema, normal ROM Neurologic: Alert and oriented x 4.  GU: Cervix closed/thick   LAB RESULTS Results for orders placed or performed during the hospital encounter of 01/07/18 (from the past 24 hour(s))  Urinalysis, Routine w reflex microscopic     Status: Abnormal   Collection Time: 01/07/18  1:07 AM  Result Value Ref Range   Color, Urine YELLOW YELLOW   APPearance CLEAR CLEAR   Specific Gravity, Urine 1.023 1.005 - 1.030   pH 5.0 5.0 - 8.0   Glucose, UA NEGATIVE NEGATIVE mg/dL   Hgb urine dipstick NEGATIVE NEGATIVE   Bilirubin Urine NEGATIVE NEGATIVE   Ketones, ur 20 (A) NEGATIVE mg/dL   Protein, ur NEGATIVE NEGATIVE mg/dL   Nitrite NEGATIVE NEGATIVE   Leukocytes, UA NEGATIVE NEGATIVE  CBC     Status: Abnormal   Collection Time: 01/07/18  1:52 AM  Result Value Ref Range   WBC 8.7 4.0 - 10.5 K/uL   RBC 3.69 (L) 3.87 - 5.11 MIL/uL   Hemoglobin 11.2 (L) 12.0 - 15.0 g/dL   HCT 16.1 (L) 09.6 - 04.5 %   MCV 93.0 80.0 - 100.0 fL   MCH 30.4 26.0 - 34.0 pg   MCHC 32.7 30.0 - 36.0 g/dL   RDW 40.9 81.1 - 91.4 %   Platelets 252 150 - 400 K/uL   nRBC 0.0 0.0 - 0.2 %  Comprehensive metabolic panel     Status: Abnormal   Collection Time: 01/07/18  1:52 AM  Result Value Ref Range   Sodium 135 135 - 145 mmol/L   Potassium 3.5 3.5 - 5.1 mmol/L   Chloride 103 98 - 111 mmol/L   CO2 23 22 - 32 mmol/L   Glucose, Bld 89 70 - 99 mg/dL   BUN 7 6 - 20 mg/dL   Creatinine, Ser 7.82 0.44 - 1.00 mg/dL   Calcium 8.7 (L) 8.9 - 10.3 mg/dL   Total Protein 7.2  6.5 - 8.1 g/dL  Albumin 3.7 3.5 - 5.0 g/dL   AST 14 (L) 15 - 41 U/L   ALT 12 0 - 44 U/L   Alkaline Phosphatase 36 (L) 38 - 126 U/L   Total Bilirubin 0.2 (L) 0.3 - 1.2 mg/dL   GFR calc non Af Amer >60 >60 mL/min   GFR calc Af Amer >60 >60 mL/min   Anion gap 9 5 - 15  Lipase, blood     Status: None   Collection Time: 01/07/18  1:52 AM  Result Value Ref Range   Lipase 20 11 - 51 U/L    IMAGING No results found.  MAU COURSE Orders Placed This Encounter  Procedures  . [place] MFM OB LIMITED  . Urinalysis, Routine w reflex microscopic  . CBC  . Comprehensive metabolic panel  . Lipase, blood  . Discharge patient   No orders of the defined types were placed in this encounter.   MDM VSS, NAD FHT present via doppler & cervix closed No evidence of infection in urine CBC, CMP & lipase ordered d/t epigastric pain - labs reassuring Ultrasound --- no ovarian masses/cysts  ASSESSMENT 1. RLQ abdominal pain   2. [redacted] weeks gestation of pregnancy     PLAN Discharge home in stable condition. SAB precautions  Allergies as of 01/07/2018   No Known Allergies     Medication List    STOP taking these medications   acyclovir 400 MG tablet Commonly known as:  ZOVIRAX   metroNIDAZOLE 500 MG tablet Commonly known as:  FLAGYL   nitrofurantoin (macrocrystal-monohydrate) 100 MG capsule Commonly known as:  MACROBID   valACYclovir 500 MG tablet Commonly known as:  VALTREX     TAKE these medications   acetaminophen 325 MG tablet Commonly known as:  TYLENOL Take 650 mg by mouth every 6 (six) hours as needed for moderate pain.   ondansetron 4 MG tablet Commonly known as:  ZOFRAN Take 4 mg by mouth every 8 (eight) hours as needed for nausea or vomiting.   PNV PO Take by mouth.        Judeth Horn, NP 01/07/2018  3:21 AM

## 2018-01-07 NOTE — MAU Note (Signed)
Pt reports to MAU c/o right sided abdominal pain that started around 1700. Pt also reports a hx of a cyst on this side pt is unsure if that is related to her pain or not. Pt reports some tightnting of her belly today. Pt denies vaginal bleeding and reports a normal vaginal discharge.

## 2018-01-07 NOTE — Discharge Instructions (Signed)

## 2018-01-20 ENCOUNTER — Telehealth: Payer: Self-pay

## 2018-01-20 NOTE — Telephone Encounter (Signed)
Contacted pt to check to see if she wanted to stay on [facility] optimized scheduling. Pt states that she has not been able to get it to work, and has spoken to tech support without resolution. Pt requested to be removed from [facility].

## 2018-02-01 ENCOUNTER — Encounter (HOSPITAL_COMMUNITY): Payer: Self-pay

## 2018-02-02 ENCOUNTER — Encounter: Payer: Self-pay | Admitting: Obstetrics and Gynecology

## 2018-02-02 ENCOUNTER — Ambulatory Visit (INDEPENDENT_AMBULATORY_CARE_PROVIDER_SITE_OTHER): Payer: BLUE CROSS/BLUE SHIELD | Admitting: Obstetrics and Gynecology

## 2018-02-02 ENCOUNTER — Other Ambulatory Visit: Payer: Self-pay

## 2018-02-02 VITALS — BP 119/74 | HR 87 | Wt 257.2 lb

## 2018-02-02 DIAGNOSIS — Z3480 Encounter for supervision of other normal pregnancy, unspecified trimester: Secondary | ICD-10-CM | POA: Diagnosis not present

## 2018-02-02 DIAGNOSIS — O98519 Other viral diseases complicating pregnancy, unspecified trimester: Secondary | ICD-10-CM

## 2018-02-02 DIAGNOSIS — B009 Herpesviral infection, unspecified: Secondary | ICD-10-CM

## 2018-02-02 DIAGNOSIS — Z3482 Encounter for supervision of other normal pregnancy, second trimester: Secondary | ICD-10-CM

## 2018-02-02 MED ORDER — COMFORT FIT MATERNITY SUPP LG MISC: 0 refills | Status: DC

## 2018-02-02 NOTE — Progress Notes (Addendum)
   PRENATAL VISIT NOTE  Subjective:  Victoria Robinson is a 32 y.o. [id number] at 9187w0d being seen today for ongoing prenatal care.  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  Patient reports backache with pain radiating down right leg.  Contractions: Not present. Vag. Bleeding: None.  Movement: Present. Denies leaking of fluid.   The following portions of the patient's history were reviewed and updated as appropriate: allergies, current medications, past family history, past medical history, past social history, past surgical history and problem list. Problem list updated.  Objective:   Vitals:   02/02/18 0926  BP: 119/74  Pulse: 87  Weight: 257 lb 3.2 oz (116.7 kg)    Fetal Status: Fetal Heart Rate (bpm): 152   Movement: Present     General:  Alert, oriented and cooperative. Patient is in no acute distress.  Skin: Skin is warm and dry. No rash noted.   Cardiovascular: Normal heart rate noted  Respiratory: Normal respiratory effort, no problems with respiration noted  Abdomen: Soft, gravid, appropriate for gestational age.  Pain/Pressure: Absent     Pelvic: Cervical exam deferred        Extremities: Normal range of motion.  Edema: None  Mental Status: Normal mood and affect. Normal behavior. Normal judgment and thought content.   Assessment and Plan:  Pregnancy: A5W0981G4P2012 at 7987w0d  1. Supervision of other normal pregnancy, antepartum Patient is doing well without complaints Anatomy ultrasound scheduled on 1/6 AFP today Rx maternity support belt  2. Herpes simplex type 2 (HSV-2) infection affecting pregnancy, antepartum Prophylaxis in 3rd trimester  Preterm labor symptoms and general obstetric precautions including but not limited to vaginal bleeding, contractions, leaking  of fluid and fetal movement were reviewed in detail with the patient. Please refer to After Visit Summary for other counseling recommendations.  No follow-ups on file.  Future Appointments  Date Time Provider Department Center  02/08/2018  8:45 AM [facility] US 2 [facility]    Catalina AntiguaPeggy Kynzley Dowson, MD

## 2018-02-02 NOTE — Addendum Note (Signed)
Addended by: Catalina AntiguaONSTANT, Carely Nappier on: 02/02/2018 09:46 AM   Modules accepted: Orders

## 2018-02-03 NOTE — L&D Delivery Note (Signed)
Delivery Note At 5:10 PM a viable and healthy female was delivered via Vaginal, Spontaneous (Presentation: ROA ).  APGAR: 9, 9; weight 7 lb 7.7 oz (3393 g).   Placenta status: spontaneous, intact.  Cord: 3 vessel with the following complications: none.    Anesthesia:  epidural Episiotomy: None Lacerations:  none Suture Repair: n/a Est. Blood Loss (mL): 187  Mom to postpartum.  Baby to Couplet care / Skin to Skin.  Victoria Robinson is a 33 y.o. female [phone] with IUP at [redacted]w[redacted]d admitted for IOL for GHTN .  She progressed with augmentation to complete and pushed less than 10 minutes to deliver.  Cord clamping delayed by 1-3 minutes then clamped by CNM and cut by FOB.  Placenta intact and spontaneous, bleeding minimal. Intact perineum.  Mom and baby stable prior to transfer to postpartum. She plans on breastfeeding. She requests Nexplanon then BTL for birth control.   [name] 06/17/2018, 9:16 PM

## 2018-02-05 LAB — AFP, SERUM, OPEN SPINA BIFIDA
AFP MoM: 0.85
AFP Value: 29.3 ng/mL
Gest. Age on Collection Date: 18 weeks
MATERNAL AGE AT EDD: 33.7 a
OSBR Risk 1 IN: 10000
Test Results:: NEGATIVE
WEIGHT: 257 [lb_av]

## 2018-02-08 ENCOUNTER — Ambulatory Visit (HOSPITAL_COMMUNITY)
Admission: RE | Admit: 2018-02-08 | Discharge: 2018-02-08 | Disposition: A | Discharge status: Home / Self Care | Payer: BLUE CROSS/BLUE SHIELD | Source: Ambulatory Visit | Attending: Obstetrics and Gynecology | Admitting: Obstetrics and Gynecology

## 2018-02-08 ENCOUNTER — Other Ambulatory Visit: Payer: Self-pay | Admitting: Obstetrics and Gynecology

## 2018-02-08 ENCOUNTER — Other Ambulatory Visit (HOSPITAL_COMMUNITY): Payer: Self-pay | Admitting: *Deleted

## 2018-02-08 DIAGNOSIS — O99212 Obesity complicating pregnancy, second trimester: Secondary | ICD-10-CM | POA: Diagnosis not present

## 2018-02-08 DIAGNOSIS — Z363 Encounter for antenatal screening for malformations: Secondary | ICD-10-CM

## 2018-02-08 DIAGNOSIS — Z3A18 18 weeks gestation of pregnancy: Secondary | ICD-10-CM

## 2018-02-08 DIAGNOSIS — Z3480 Encounter for supervision of other normal pregnancy, unspecified trimester: Secondary | ICD-10-CM | POA: Diagnosis not present

## 2018-02-08 DIAGNOSIS — Z362 Encounter for other antenatal screening follow-up: Secondary | ICD-10-CM

## 2018-02-23 DIAGNOSIS — O339 Maternal care for disproportion, unspecified: Secondary | ICD-10-CM | POA: Diagnosis not present

## 2018-02-24 ENCOUNTER — Encounter: Payer: Self-pay | Admitting: Obstetrics and Gynecology

## 2018-02-24 ENCOUNTER — Ambulatory Visit (INDEPENDENT_AMBULATORY_CARE_PROVIDER_SITE_OTHER): Payer: BLUE CROSS/BLUE SHIELD | Admitting: Obstetrics and Gynecology

## 2018-02-24 VITALS — BP 122/16 | HR 80 | Wt 262.0 lb

## 2018-02-24 DIAGNOSIS — B009 Herpesviral infection, unspecified: Secondary | ICD-10-CM

## 2018-02-24 DIAGNOSIS — Z3480 Encounter for supervision of other normal pregnancy, unspecified trimester: Secondary | ICD-10-CM

## 2018-02-24 DIAGNOSIS — Z3482 Encounter for supervision of other normal pregnancy, second trimester: Secondary | ICD-10-CM

## 2018-02-24 DIAGNOSIS — O98512 Other viral diseases complicating pregnancy, second trimester: Secondary | ICD-10-CM

## 2018-02-24 MED ORDER — CYCLOBENZAPRINE HCL 10 MG PO TABS: 10.0000 mg | ORAL_TABLET | Freq: Three times a day (TID) | ORAL | 2 refills | Status: DC | PRN

## 2018-02-24 NOTE — Progress Notes (Signed)
Pain on rt side of abdomen   And dizziness over the weekend

## 2018-02-24 NOTE — Progress Notes (Signed)
   PRENATAL VISIT NOTE  Subjective:  Victoria Robinson is a 33 y.o. [id number] at [redacted]w[redacted]d being seen today for ongoing prenatal care.  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  Patient reports backache.  Contractions: Irritability. Vag. Bleeding: None.  Movement: Present. Denies leaking of fluid.   The following portions of the patient's history were reviewed and updated as appropriate: allergies, current medications, past family history, past medical history, past social history, past surgical history and problem list. Problem list updated.  Objective:   Vitals:   02/24/18 1615  Weight: 262 lb (118.8 kg)    Fetal Status: Fetal Heart Rate (bpm): 152   Movement: Present     General:  Alert, oriented and cooperative. Patient is in no acute distress.  Skin: Skin is warm and dry. No rash noted.   Cardiovascular: Normal heart rate noted  Respiratory: Normal respiratory effort, no problems with respiration noted  Abdomen: Soft, gravid, appropriate for gestational age.  Pain/Pressure: Present     Pelvic: Cervical exam deferred        Extremities: Normal range of motion.  Edema: None  Mental Status: Normal mood and affect. Normal behavior. Normal judgment and thought content.   Assessment and Plan:  Pregnancy: E3P2951 at [redacted]w[redacted]d  1. Supervision of other normal pregnancy, antepartum Patient is doing well Follow up anatomy ultrasound scheduled Patient reports improvement in her back pain with maternity belt and is requesting a Rx flexeril  2. Herpes simplex virus type 2 (HSV-2) infection affecting pregnancy in second trimester Prophylaxis at 36 weeks  Preterm labor symptoms and general obstetric precautions including but not limited to vaginal bleeding, contractions, leaking of fluid  and fetal movement were reviewed in detail with the patient. Please refer to After Visit Summary for other counseling recommendations.  Return in about 4 weeks (around 03/24/2018) for ROB.  Future Appointments  Date Time Provider Department Center  03/08/2018  3:00 PM [facility] [place] 3 [facility]    Catalina Antigua, MD

## 2018-02-24 NOTE — Progress Notes (Signed)
Requesting refill on flagyl

## 2018-03-08 ENCOUNTER — Ambulatory Visit (HOSPITAL_COMMUNITY)
Admission: RE | Admit: 2018-03-08 | Discharge: 2018-03-08 | Disposition: A | Discharge status: Home / Self Care | Payer: BLUE CROSS/BLUE SHIELD | Source: Ambulatory Visit | Attending: Family | Admitting: Family

## 2018-03-08 DIAGNOSIS — Z362 Encounter for other antenatal screening follow-up: Secondary | ICD-10-CM | POA: Diagnosis not present

## 2018-03-08 DIAGNOSIS — Z3A22 22 weeks gestation of pregnancy: Secondary | ICD-10-CM

## 2018-03-08 DIAGNOSIS — O99210 Obesity complicating pregnancy, unspecified trimester: Secondary | ICD-10-CM

## 2018-03-09 ENCOUNTER — Other Ambulatory Visit (HOSPITAL_COMMUNITY): Payer: Self-pay | Admitting: *Deleted

## 2018-03-09 DIAGNOSIS — O99213 Obesity complicating pregnancy, third trimester: Secondary | ICD-10-CM

## 2018-03-22 ENCOUNTER — Ambulatory Visit (INDEPENDENT_AMBULATORY_CARE_PROVIDER_SITE_OTHER): Payer: BLUE CROSS/BLUE SHIELD | Admitting: Obstetrics and Gynecology

## 2018-03-22 ENCOUNTER — Encounter: Payer: Self-pay | Admitting: Obstetrics and Gynecology

## 2018-03-22 ENCOUNTER — Other Ambulatory Visit: Payer: Self-pay

## 2018-03-22 VITALS — BP 127/84 | HR 90 | Wt 266.0 lb

## 2018-03-22 DIAGNOSIS — Z3A24 24 weeks gestation of pregnancy: Secondary | ICD-10-CM

## 2018-03-22 DIAGNOSIS — Z3480 Encounter for supervision of other normal pregnancy, unspecified trimester: Secondary | ICD-10-CM

## 2018-03-22 DIAGNOSIS — B009 Herpesviral infection, unspecified: Secondary | ICD-10-CM

## 2018-03-22 DIAGNOSIS — O98512 Other viral diseases complicating pregnancy, second trimester: Secondary | ICD-10-CM

## 2018-03-22 DIAGNOSIS — Z3482 Encounter for supervision of other normal pregnancy, second trimester: Secondary | ICD-10-CM

## 2018-03-22 NOTE — Progress Notes (Signed)
ROB.  C/o coughing until it hurts.

## 2018-03-22 NOTE — Progress Notes (Signed)
   PRENATAL VISIT NOTE  Subjective:  Victoria Robinson is a 33 y.o. W4O9735 at [redacted]w[redacted]d being seen today for ongoing prenatal care.  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  Patient reports persist cough.  Contractions: Irritability. Vag. Bleeding: None.  Movement: Present. Denies leaking of fluid.   The following portions of the patient's history were reviewed and updated as appropriate: allergies, current medications, past family history, past medical history, past social history, past surgical history and problem list. Problem list updated.  Objective:   Vitals:   03/22/18 1555  BP: 127/84  Pulse: 90  Weight: 266 lb (120.7 kg)    Fetal Status: Fetal Heart Rate (bpm): 159 Fundal Height: 26 cm Movement: Present     General:  Alert, oriented and cooperative. Patient is in no acute distress.  Skin: Skin is warm and dry. No rash noted.   Cardiovascular: Normal heart rate noted  Respiratory: Normal respiratory effort, no problems with respiration noted  Abdomen: Soft, gravid, appropriate for gestational age.  Pain/Pressure: Present     Pelvic: Cervical exam deferred        Extremities: Normal range of motion.  Edema: Trace  Mental Status: Normal mood and affect. Normal behavior. Normal judgment and thought content.   Assessment and Plan:  Pregnancy: H2D9242 at [redacted]w[redacted]d  1. Supervision of other normal pregnancy, antepartum Patient is doing well Third trimester labs next visit Follow up anatomy ultrasound  2. Herpes simplex virus type 2 (HSV-2) infection affecting pregnancy in second trimester Prophylaxis at 36 weeks  Preterm labor symptoms and general obstetric precautions including but not limited to vaginal bleeding, contractions, leaking of fluid and fetal movement were  reviewed in detail with the patient. Please refer to After Visit Summary for other counseling recommendations.  Return in about 4 weeks (around 04/19/2018) for ROB, 2 hr glucola next visit.  Future Appointments  Date Time Provider Department Center  04/12/2018  3:30 PM [facility] [place] 1 [facility]-US    Catalina Antigua, MD

## 2018-03-30 DIAGNOSIS — Z3482 Encounter for supervision of other normal pregnancy, second trimester: Secondary | ICD-10-CM

## 2018-04-09 ENCOUNTER — Telehealth: Payer: Self-pay

## 2018-04-09 NOTE — Telephone Encounter (Signed)
Called pt to confirm info on paperwork, no answer, left vm

## 2018-04-12 ENCOUNTER — Ambulatory Visit (HOSPITAL_COMMUNITY)
Admission: RE | Admit: 2018-04-12 | Discharge: 2018-04-12 | Disposition: A | Discharge status: Home / Self Care | Payer: BLUE CROSS/BLUE SHIELD | Source: Ambulatory Visit | Attending: Family | Admitting: Family

## 2018-04-12 ENCOUNTER — Other Ambulatory Visit (HOSPITAL_COMMUNITY): Payer: Self-pay | Admitting: *Deleted

## 2018-04-12 DIAGNOSIS — Z3A27 27 weeks gestation of pregnancy: Secondary | ICD-10-CM

## 2018-04-12 DIAGNOSIS — Z362 Encounter for other antenatal screening follow-up: Secondary | ICD-10-CM

## 2018-04-12 DIAGNOSIS — O99213 Obesity complicating pregnancy, third trimester: Secondary | ICD-10-CM | POA: Diagnosis not present

## 2018-04-12 DIAGNOSIS — Z6841 Body Mass Index (BMI) 40.0 and over, adult: Secondary | ICD-10-CM

## 2018-04-12 DIAGNOSIS — O99212 Obesity complicating pregnancy, second trimester: Secondary | ICD-10-CM

## 2018-04-19 ENCOUNTER — Other Ambulatory Visit: Payer: Self-pay

## 2018-04-19 ENCOUNTER — Encounter: Payer: Self-pay | Admitting: Obstetrics and Gynecology

## 2018-04-19 ENCOUNTER — Ambulatory Visit (INDEPENDENT_AMBULATORY_CARE_PROVIDER_SITE_OTHER): Payer: BLUE CROSS/BLUE SHIELD | Admitting: Obstetrics and Gynecology

## 2018-04-19 ENCOUNTER — Other Ambulatory Visit: Payer: BLUE CROSS/BLUE SHIELD

## 2018-04-19 VITALS — BP 124/79 | HR 85 | Wt 263.2 lb

## 2018-04-19 DIAGNOSIS — Z3480 Encounter for supervision of other normal pregnancy, unspecified trimester: Secondary | ICD-10-CM

## 2018-04-19 DIAGNOSIS — Z23 Encounter for immunization: Secondary | ICD-10-CM

## 2018-04-19 DIAGNOSIS — O98513 Other viral diseases complicating pregnancy, third trimester: Secondary | ICD-10-CM

## 2018-04-19 DIAGNOSIS — B009 Herpesviral infection, unspecified: Secondary | ICD-10-CM

## 2018-04-19 DIAGNOSIS — Z3A29 29 weeks gestation of pregnancy: Secondary | ICD-10-CM

## 2018-04-19 MED ORDER — DOCUSATE SODIUM 100 MG PO CAPS: 100.0000 mg | ORAL_CAPSULE | Freq: Two times a day (BID) | ORAL | 2 refills | Status: DC

## 2018-04-19 NOTE — Progress Notes (Signed)
Pt is here for ROB and 2 hr gtt. [redacted]w[redacted]d.

## 2018-04-20 LAB — CBC
Hematocrit: 35.5 % (ref 34.0–46.6)
Hemoglobin: 11.8 g/dL (ref 11.1–15.9)
MCH: 30.4 pg (ref 26.6–33.0)
MCHC: 33.2 g/dL (ref 31.5–35.7)
MCV: 92 fL (ref 79–97)
Platelets: 238 10*3/uL (ref 150–450)
RBC: 3.88 x10E6/uL (ref 3.77–5.28)
RDW: 13.1 % (ref 11.7–15.4)
WBC: 8 10*3/uL (ref 3.4–10.8)

## 2018-04-20 LAB — HIV ANTIBODY (ROUTINE TESTING W REFLEX): HIV Screen 4th Generation wRfx: NONREACTIVE

## 2018-04-20 LAB — GLUCOSE TOLERANCE, 2 HOURS W/ 1HR
GLUCOSE, FASTING: 87 mg/dL (ref 65–91)
Glucose, 1 hour: 101 mg/dL (ref 65–179)
Glucose, 2 hour: 78 mg/dL (ref 65–152)

## 2018-04-20 LAB — RPR: RPR Ser Ql: NONREACTIVE

## 2018-04-22 NOTE — Progress Notes (Signed)
   PRENATAL VISIT NOTE  Subjective:  Victoria Robinson is a 33 y.o. L2G4010 at [redacted]w[redacted]d being seen today for ongoing prenatal care.  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  Patient reports no complaints.  Contractions: Irritability. Vag. Bleeding: None.  Movement: Present. Denies leaking of fluid.   The following portions of the patient's history were reviewed and updated as appropriate: allergies, current medications, past family history, past medical history, past social history, past surgical history and problem list.   Objective:   Vitals:   04/19/18 0839  BP: 124/79  Pulse: 85  Weight: 263 lb 3.2 oz (119.4 kg)    Fetal Status: Fetal Heart Rate (bpm): 154 Fundal Height: 29 cm Movement: Present     General:  Alert, oriented and cooperative. Patient is in no acute distress.  Skin: Skin is warm and dry. No rash noted.   Cardiovascular: Normal heart rate noted  Respiratory: Normal respiratory effort, no problems with respiration noted  Abdomen: Soft, gravid, appropriate for gestational age.  Pain/Pressure: Present     Pelvic: Cervical exam deferred        Extremities: Normal range of motion.  Edema: Trace  Mental Status: Normal mood and affect. Normal behavior. Normal judgment and thought content.   Assessment and Plan:  Pregnancy: U7O5366 at [redacted]w[redacted]d 1. Supervision of other normal pregnancy, antepartum Patient is doing well without complaints Third trimester labs today Follow up ultrasound scheduled - Glucose Tolerance, 2 Hours w/1 Hour - CBC - RPR - HIV Antibody (routine testing w rflx) - Tdap vaccine greater than or equal to 7yo IM  2. Herpes simplex virus type 2 (HSV-2) infection affecting pregnancy in third trimester Prophylaxis in third trimester  Preterm labor  symptoms and general obstetric precautions including but not limited to vaginal bleeding, contractions, leaking of fluid and fetal movement were reviewed in detail with the patient. Please refer to After Visit Summary for other counseling recommendations.   Return in about 2 weeks (around 05/03/2018) for ROB.  Future Appointments  Date Time Provider Department Center  05/03/2018  4:00 PM Zakya Halabi, Gigi Gin, MD [facility] None  05/17/2018  3:15 PM [facility] [place] 4 [facility]-US    Catalina Antigua, MD

## 2018-05-03 ENCOUNTER — Other Ambulatory Visit: Payer: Self-pay

## 2018-05-03 ENCOUNTER — Ambulatory Visit (INDEPENDENT_AMBULATORY_CARE_PROVIDER_SITE_OTHER): Payer: BLUE CROSS/BLUE SHIELD | Admitting: Obstetrics and Gynecology

## 2018-05-03 ENCOUNTER — Encounter: Payer: Self-pay | Admitting: Obstetrics and Gynecology

## 2018-05-03 DIAGNOSIS — Z3480 Encounter for supervision of other normal pregnancy, unspecified trimester: Secondary | ICD-10-CM

## 2018-05-03 DIAGNOSIS — O99213 Obesity complicating pregnancy, third trimester: Secondary | ICD-10-CM

## 2018-05-03 DIAGNOSIS — Z6841 Body Mass Index (BMI) 40.0 and over, adult: Secondary | ICD-10-CM

## 2018-05-03 DIAGNOSIS — O98513 Other viral diseases complicating pregnancy, third trimester: Secondary | ICD-10-CM

## 2018-05-03 DIAGNOSIS — Z3A30 30 weeks gestation of pregnancy: Secondary | ICD-10-CM

## 2018-05-03 DIAGNOSIS — B009 Herpesviral infection, unspecified: Secondary | ICD-10-CM

## 2018-05-03 NOTE — Progress Notes (Signed)
   TELEHEALTH VIRTUAL OBSTETRICS VISIT ENCOUNTER NOTE  I connected with Victoria Robinson on 05/03/18 at  4:00 PM EDT by telephone at home and verified that I am speaking with the correct person using two identifiers.   I discussed the limitations, risks, security and privacy concerns of performing an evaluation and management service by telephone and the availability of in person appointments. I also discussed with the patient that there may be a patient responsible charge related to this service. The patient expressed understanding and agreed to proceed.  Subjective:  Victoria Robinson is a 33 y.o. G4W1027 at [redacted]w[redacted]d being followed for ongoing prenatal care.  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  Patient reports no complaints. Reports fetal movement. Denies any contractions, bleeding or leaking of fluid.   The following portions of the patient's history were reviewed and updated as appropriate: allergies, current medications, past family history, past medical history, past social history, past surgical history and problem list.   Objective:   General:  Alert, oriented and cooperative.   Mental Status: Normal mood and affect perceived. Normal judgment and thought content.  Rest of physical exam deferred due to type of encounter  Assessment and Plan:  Pregnancy: O5D6644 at [redacted]w[redacted]d 1. Supervision of other normal pregnancy, antepartum Patient is doing without complaints Patient has a BP cuff at home and understands that her next visit will be another telehealth visit She agrees to taking her BP weekly  2. Herpes simplex virus type 2 (HSV-2) infection affecting pregnancy in third trimester Suppression in third trimester  3. Class 3 severe obesity with body mass index (BMI) of 45.0  to 49.9 in adult, unspecified obesity type, unspecified whether serious comorbidity present (HCC)   Preterm labor symptoms and general obstetric precautions including but not limited to vaginal bleeding, contractions, leaking of fluid and fetal movement were reviewed in detail with the patient.  I discussed the assessment and treatment plan with the patient. The patient was provided an opportunity to ask questions and all were answered. The patient agreed with the plan and demonstrated an understanding of the instructions. The patient was advised to call back or seek an in-person office evaluation/go to MAU at [facility] for any urgent or concerning symptoms. Please refer to After Visit Summary for other counseling recommendations.   I provided 15 minutes of non-face-to-face time during this encounter.  No follow-ups on file.  Future Appointments  Date Time Provider Department Center  05/17/2018  3:15 PM [facility] NURSE [facility]-US  05/17/2018  3:15 PM [facility] [place] 4 [facility]-M[facility] M[facility]-US    Catalina Antigua, MD [facility] [employer], [facility]

## 2018-05-17 ENCOUNTER — Ambulatory Visit (HOSPITAL_COMMUNITY): Payer: Medicaid Other

## 2018-05-18 ENCOUNTER — Ambulatory Visit (INDEPENDENT_AMBULATORY_CARE_PROVIDER_SITE_OTHER): Payer: BLUE CROSS/BLUE SHIELD | Admitting: Obstetrics & Gynecology

## 2018-05-18 VITALS — BP 124/78 | HR 84

## 2018-05-18 DIAGNOSIS — Z6841 Body Mass Index (BMI) 40.0 and over, adult: Secondary | ICD-10-CM

## 2018-05-18 DIAGNOSIS — O99213 Obesity complicating pregnancy, third trimester: Secondary | ICD-10-CM

## 2018-05-18 DIAGNOSIS — B009 Herpesviral infection, unspecified: Secondary | ICD-10-CM

## 2018-05-18 DIAGNOSIS — O98513 Other viral diseases complicating pregnancy, third trimester: Secondary | ICD-10-CM

## 2018-05-18 DIAGNOSIS — Z3A33 33 weeks gestation of pregnancy: Secondary | ICD-10-CM

## 2018-05-18 DIAGNOSIS — Z3480 Encounter for supervision of other normal pregnancy, unspecified trimester: Secondary | ICD-10-CM

## 2018-05-18 NOTE — Progress Notes (Signed)
See other note

## 2018-05-18 NOTE — Progress Notes (Signed)
   TELEHEALTH VIRTUAL OBSTETRICS VISIT ENCOUNTER NOTE  I connected with Victoria Robinson on 05/18/18 at  2:45 PM EDT by telephone at home and verified that I am speaking with the correct person using two identifiers.   I discussed the limitations, risks, security and privacy concerns of performing an evaluation and management service by telephone and the availability of in person appointments. I also discussed with the patient that there may be a patient responsible charge related to this service. The patient expressed understanding and agreed to proceed.  Subjective:  Victoria Robinson is a 33 y.o. A5W0981 (9 and 5 yo kids) at [redacted]w[redacted]d being followed for ongoing prenatal care.  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  Patient reports no complaints. Reports fetal movement. Denies any contractions, bleeding or leaking of fluid.   The following portions of the patient's history were reviewed and updated as appropriate: allergies, current medications, past family history, past medical history, past social history, past surgical history and problem list.   Objective:   General:  Alert, oriented and cooperative.   Mental Status: Normal mood and affect perceived. Normal judgment and thought content.  Rest of physical exam deferred due to type of encounter  Assessment and Plan:  Pregnancy: [id number] at [redacted]w[redacted]d 1. Herpes simplex virus type 2 (HSV-2) infection affecting pregnancy in third trimester - She takes acyclovir prn and knows that she will need to start this daily at 36 weeks  2. Supervision of other normal pregnancy, antepartum - she has a BP cuff at home  3. Class 3 severe obesity with body mass index (BMI) of 45.0 to 49.9 in adult, unspecified obesity type, unspecified whether  serious comorbidity present (HCC)  Preterm labor symptoms and general obstetric precautions including but not limited to vaginal bleeding, contractions, leaking of fluid and fetal movement were reviewed in detail with the patient.  I discussed the assessment and treatment plan with the patient. The patient was provided an opportunity to ask questions and all were answered. The patient agreed with the plan and demonstrated an understanding of the instructions. The patient was advised to call back or seek an in-person office evaluation/go to MAU at [facility] for any urgent or concerning symptoms. Please refer to After Visit Summary for other counseling recommendations.   I provided 8 minutes of non-face-to-face time during this encounter.  No follow-ups on file.  Future Appointments  Date Time Provider Department Center  05/18/2018  2:45 PM Allie Bossier, MD [facility] None  05/31/2018  3:30 PM [facility]-M[facility] NURSE WH-MFC MFC-US  05/31/2018  3:30 PM [facility] [place] 1 WH-MFCUS MFC-US    Allie Bossier, MD [facility] [employer], [facility]

## 2018-05-31 ENCOUNTER — Ambulatory Visit (HOSPITAL_COMMUNITY): Payer: BLUE CROSS/BLUE SHIELD

## 2018-05-31 ENCOUNTER — Other Ambulatory Visit: Payer: Self-pay

## 2018-05-31 ENCOUNTER — Ambulatory Visit (HOSPITAL_COMMUNITY)
Admission: RE | Admit: 2018-05-31 | Discharge: 2018-05-31 | Disposition: A | Discharge status: Home / Self Care | Payer: BLUE CROSS/BLUE SHIELD | Source: Ambulatory Visit | Attending: Maternal & Fetal Medicine | Admitting: Maternal & Fetal Medicine

## 2018-05-31 DIAGNOSIS — Z362 Encounter for other antenatal screening follow-up: Secondary | ICD-10-CM | POA: Diagnosis not present

## 2018-05-31 DIAGNOSIS — Z6841 Body Mass Index (BMI) 40.0 and over, adult: Secondary | ICD-10-CM | POA: Diagnosis not present

## 2018-06-01 ENCOUNTER — Other Ambulatory Visit (HOSPITAL_COMMUNITY): Payer: Self-pay | Admitting: *Deleted

## 2018-06-01 ENCOUNTER — Encounter: Payer: Self-pay | Admitting: Advanced Practice Midwife

## 2018-06-01 ENCOUNTER — Ambulatory Visit (INDEPENDENT_AMBULATORY_CARE_PROVIDER_SITE_OTHER): Payer: BLUE CROSS/BLUE SHIELD | Admitting: Advanced Practice Midwife

## 2018-06-01 VITALS — BP 134/78 | HR 76 | Wt 265.0 lb

## 2018-06-01 DIAGNOSIS — Z3A35 35 weeks gestation of pregnancy: Secondary | ICD-10-CM

## 2018-06-01 DIAGNOSIS — Z362 Encounter for other antenatal screening follow-up: Secondary | ICD-10-CM

## 2018-06-01 DIAGNOSIS — B9689 Other specified bacterial agents as the cause of diseases classified elsewhere: Secondary | ICD-10-CM

## 2018-06-01 DIAGNOSIS — O3663X0 Maternal care for excessive fetal growth, third trimester, not applicable or unspecified: Secondary | ICD-10-CM | POA: Insufficient documentation

## 2018-06-01 DIAGNOSIS — O98519 Other viral diseases complicating pregnancy, unspecified trimester: Secondary | ICD-10-CM

## 2018-06-01 DIAGNOSIS — B009 Herpesviral infection, unspecified: Secondary | ICD-10-CM

## 2018-06-01 DIAGNOSIS — Z3480 Encounter for supervision of other normal pregnancy, unspecified trimester: Secondary | ICD-10-CM

## 2018-06-01 DIAGNOSIS — O98513 Other viral diseases complicating pregnancy, third trimester: Secondary | ICD-10-CM

## 2018-06-01 DIAGNOSIS — N760 Acute vaginitis: Secondary | ICD-10-CM

## 2018-06-01 MED ORDER — VALACYCLOVIR HCL 500 MG PO TABS: 500.0000 mg | ORAL_TABLET | Freq: Two times a day (BID) | ORAL | 1 refills | Status: DC

## 2018-06-01 MED ORDER — METRONIDAZOLE 500 MG PO TABS: 500.0000 mg | ORAL_TABLET | Freq: Two times a day (BID) | ORAL | 0 refills | Status: DC

## 2018-06-01 NOTE — Progress Notes (Signed)
Webex ROB:  Pt c/o vaginal discharge and odor.  Pt requests Valtrex rx instead of Acyclovir.  Had u/s yesterday. Pt concerned about baby's weight.  Pt still unsure about BTL

## 2018-06-01 NOTE — Progress Notes (Signed)
PRENATAL VISIT NOTE TELEHEALTH VIRTUAL OBSTETRICS VISIT ENCOUNTER NOTE  I connected with@ on 06/01/18 at  3:00 PM EDT by Webex at home and verified that I am speaking with the correct person using two identifiers.   I discussed the limitations, risks, security and privacy concerns of performing an evaluation and management service by telephone and the availability of in person appointments. I also discussed with the patient that there may be a patient responsible charge related to this service. The patient expressed understanding and agreed to proceed. Subjective:  Victoria Robinson is a 33 y.o. [id number] at 3353w0d being seen today for ongoing prenatal care.  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  Patient reports vaginal discharge with odor.  Reports fetal movement. Contractions: Irritability. Vag. Bleeding: None.  Movement: Present. Denies any contractions, bleeding or leaking of fluid.   The following portions of the patient's history were reviewed and updated as appropriate: allergies, current medications, past family history, past medical history, past social history, past surgical history and problem list.   Objective:   Vitals:   06/01/18 1513  BP: 134/78  Pulse: 76  Weight: 120.2 kg    Fetal Status:     Movement: Present     General:  Alert, oriented and cooperative. Patient is in no acute distress.  Respiratory: Normal respiratory effort, no problems with respiration noted  Mental Status: Normal mood and affect. Normal behavior. Normal judgment and thought content.  Rest of physical exam deferred due to type of encounter  Assessment and Plan:  Pregnancy: [id number] at 5453w0d  1. Supervision of other normal pregnancy, antepartum --Pt reports good fetal movement, denies  cramping, LOF, or frequent cramping --No BP cuff, pt in office next week for BP, PEC symptoms reviewed. ----Reviewed safety, visitor policy, reassurance about COVID-19 for pregnancy at this time. Discussed possible changes to visits, including televisits, that may occur due to COVID-19.  The office remains open if pt needs to be seen and MAU is open 24 hours/day for OB emergencies.    2. Excessive fetal growth affecting management of pregnancy in third trimester, single or unspecified fetus --EFW >90%tile at 35 weeks, F/U US ordered --Largest previous baby 8+lbs  3. Bacterial vaginosis --Pt symptoms c/w previous BV infections. She recently changed soaps and thinks that may have been the cause.  - metroNIDAZOLE (FLAGYL) 500 MG tablet; Take 1 tablet (500 mg total) by mouth 2 (two) times daily.  Dispense: 14 tablet; Refill: 0  4. Herpes simplex type 2 (HSV-2) infection affecting pregnancy, antepartum --Changed Rx to Valtrex 500 mg BID instead of acyclovir for prophylaxis prior to delivery.  Preterm labor symptoms and general obstetric precautions including but not limited to vaginal bleeding, contractions, leaking of fluid and fetal movement were reviewed in detail with the patient. I discussed the assessment and treatment plan with the patient. The patient was provided an opportunity to ask questions and all were answered. The patient agreed with the plan and demonstrated an understanding of the instructions. The patient was advised to call back or seek an in-person office evaluation/[facility] for any urgent or concerning symptoms. Please refer to After Visit Summary for other counseling recommendations.  I provided 20 minutes of non-face-to-face time during this encounter. No follow-ups on file.  Future Appointments  Date  Time Provider Department Center  06/09/2018  2:00 PM Constant, Gigi Gin, MD [facility] None  06/29/2018  3:30 PM [facility] [place] 5 WH-MFCUS MFC-US    [name], CNM [facility] [employer], [facility]

## 2018-06-09 ENCOUNTER — Encounter: Payer: Self-pay | Admitting: Obstetrics and Gynecology

## 2018-06-09 ENCOUNTER — Other Ambulatory Visit: Payer: Self-pay

## 2018-06-09 ENCOUNTER — Other Ambulatory Visit (HOSPITAL_COMMUNITY)
Admission: RE | Admit: 2018-06-09 | Discharge: 2018-06-09 | Disposition: A | Discharge status: Home / Self Care | Payer: BLUE CROSS/BLUE SHIELD | Source: Ambulatory Visit | Attending: Obstetrics and Gynecology | Admitting: Obstetrics and Gynecology

## 2018-06-09 ENCOUNTER — Ambulatory Visit (INDEPENDENT_AMBULATORY_CARE_PROVIDER_SITE_OTHER): Payer: BLUE CROSS/BLUE SHIELD | Admitting: Obstetrics and Gynecology

## 2018-06-09 VITALS — BP 134/87 | HR 97 | Wt 272.4 lb

## 2018-06-09 DIAGNOSIS — O3663X0 Maternal care for excessive fetal growth, third trimester, not applicable or unspecified: Secondary | ICD-10-CM

## 2018-06-09 DIAGNOSIS — Z3480 Encounter for supervision of other normal pregnancy, unspecified trimester: Secondary | ICD-10-CM | POA: Insufficient documentation

## 2018-06-09 DIAGNOSIS — Z3A36 36 weeks gestation of pregnancy: Secondary | ICD-10-CM

## 2018-06-09 DIAGNOSIS — O98513 Other viral diseases complicating pregnancy, third trimester: Secondary | ICD-10-CM

## 2018-06-09 DIAGNOSIS — B009 Herpesviral infection, unspecified: Secondary | ICD-10-CM

## 2018-06-09 NOTE — Progress Notes (Signed)
   PRENATAL VISIT NOTE  Subjective:  Victoria Robinson is a 33 y.o. [id number] at [redacted]w[redacted]d being seen today for ongoing prenatal care.  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  Patient reports no complaints.  Contractions: Irregular. Vag. Bleeding: None.  Movement: Present. Denies leaking of fluid.   The following portions of the patient's history were reviewed and updated as appropriate: allergies, current medications, past family history, past medical history, past social history, past surgical history and problem list.   Objective:   Vitals:   06/09/18 1403 06/09/18 1405  BP: 137/88 134/87  Pulse: 98 97  Weight: 272 lb 6.4 oz (123.6 kg)     Fetal Status: Fetal Heart Rate (bpm): 162   Movement: Present     General:  Alert, oriented and cooperative. Patient is in no acute distress.  Skin: Skin is warm and dry. No rash noted.   Cardiovascular: Normal heart rate noted  Respiratory: Normal respiratory effort, no problems with respiration noted  Abdomen: Soft, gravid, appropriate for gestational age.  Pain/Pressure: Present     Pelvic: Cervical exam performed Dilation: Fingertip Effacement (%): Thick Station: Ballotable  Extremities: Normal range of motion.  Edema: Trace  Mental Status: Normal mood and affect. Normal behavior. Normal judgment and thought content.   Assessment and Plan:  Pregnancy: [id number] at [redacted]w[redacted]d 1. Supervision of other normal pregnancy, antepartum Patient is doing well  Cultures today Patient desires BTL- Papers signed today Patient understands that her next visit will be a virtual visit  2. Herpes simplex virus type 2 (HSV-2) infection affecting pregnancy in third trimester  Continue Valtrex prophylaxis  3. Excessive fetal growth affecting management of pregnancy in third trimester, single or unspecified fetus Follow up growth ultrasound on 5/26  Preterm labor symptoms and general obstetric precautions including but not limited to vaginal bleeding, contractions, leaking of fluid and fetal movement were reviewed in detail with the patient. Please refer to After Visit Summary for other counseling recommendations.   Return in about 1 week (around 06/16/2018) for [facility], ROB.  Future Appointments  Date Time Provider Department Center  06/29/2018  3:30 PM WH-MFC [place] 5 WH-MFCUS MFC-US    Catalina Antigua, MD

## 2018-06-09 NOTE — Progress Notes (Signed)
Pt is here for ROB. [redacted]w[redacted]d.  

## 2018-06-10 LAB — CERVICOVAGINAL ANCILLARY ONLY
Chlamydia: NEGATIVE
Neisseria Gonorrhea: NEGATIVE

## 2018-06-12 LAB — STREP GP B NAA: Strep Gp B NAA: NEGATIVE

## 2018-06-15 ENCOUNTER — Encounter: Payer: Medicaid Other | Admitting: Advanced Practice Midwife

## 2018-06-16 ENCOUNTER — Ambulatory Visit (INDEPENDENT_AMBULATORY_CARE_PROVIDER_SITE_OTHER): Payer: Medicaid Other | Admitting: Obstetrics

## 2018-06-16 ENCOUNTER — Encounter (HOSPITAL_COMMUNITY): Payer: Self-pay

## 2018-06-16 ENCOUNTER — Other Ambulatory Visit: Payer: Self-pay

## 2018-06-16 ENCOUNTER — Inpatient Hospital Stay (HOSPITAL_COMMUNITY)
Admission: AD | Admit: 2018-06-16 | Discharge: 2018-06-19 | DRG: 807 | Disposition: A | Discharge status: Home / Self Care | Payer: BLUE CROSS/BLUE SHIELD | Attending: Obstetrics & Gynecology | Admitting: Obstetrics & Gynecology

## 2018-06-16 ENCOUNTER — Encounter: Payer: Self-pay | Admitting: Obstetrics

## 2018-06-16 VITALS — BP 144/80

## 2018-06-16 DIAGNOSIS — Z23 Encounter for immunization: Secondary | ICD-10-CM | POA: Diagnosis not present

## 2018-06-16 DIAGNOSIS — O99214 Obesity complicating childbirth: Secondary | ICD-10-CM | POA: Diagnosis not present

## 2018-06-16 DIAGNOSIS — O9952 Diseases of the respiratory system complicating childbirth: Secondary | ICD-10-CM | POA: Diagnosis not present

## 2018-06-16 DIAGNOSIS — J45909 Unspecified asthma, uncomplicated: Secondary | ICD-10-CM | POA: Diagnosis not present

## 2018-06-16 DIAGNOSIS — Z3480 Encounter for supervision of other normal pregnancy, unspecified trimester: Secondary | ICD-10-CM

## 2018-06-16 DIAGNOSIS — Z3A37 37 weeks gestation of pregnancy: Secondary | ICD-10-CM | POA: Diagnosis not present

## 2018-06-16 DIAGNOSIS — O322XX0 Maternal care for transverse and oblique lie, not applicable or unspecified: Secondary | ICD-10-CM | POA: Diagnosis present

## 2018-06-16 DIAGNOSIS — O163 Unspecified maternal hypertension, third trimester: Secondary | ICD-10-CM

## 2018-06-16 DIAGNOSIS — Z87891 Personal history of nicotine dependence: Secondary | ICD-10-CM

## 2018-06-16 DIAGNOSIS — Z6841 Body Mass Index (BMI) 40.0 and over, adult: Secondary | ICD-10-CM

## 2018-06-16 DIAGNOSIS — Z1159 Encounter for screening for other viral diseases: Secondary | ICD-10-CM | POA: Diagnosis not present

## 2018-06-16 DIAGNOSIS — O134 Gestational [pregnancy-induced] hypertension without significant proteinuria, complicating childbirth: Secondary | ICD-10-CM | POA: Diagnosis not present

## 2018-06-16 DIAGNOSIS — O3663X0 Maternal care for excessive fetal growth, third trimester, not applicable or unspecified: Secondary | ICD-10-CM

## 2018-06-16 DIAGNOSIS — K429 Umbilical hernia without obstruction or gangrene: Secondary | ICD-10-CM | POA: Diagnosis not present

## 2018-06-16 DIAGNOSIS — E669 Obesity, unspecified: Secondary | ICD-10-CM | POA: Diagnosis not present

## 2018-06-16 DIAGNOSIS — O169 Unspecified maternal hypertension, unspecified trimester: Secondary | ICD-10-CM

## 2018-06-16 DIAGNOSIS — O139 Gestational [pregnancy-induced] hypertension without significant proteinuria, unspecified trimester: Secondary | ICD-10-CM | POA: Diagnosis present

## 2018-06-16 DIAGNOSIS — N433 Hydrocele, unspecified: Secondary | ICD-10-CM | POA: Diagnosis not present

## 2018-06-16 DIAGNOSIS — D229 Melanocytic nevi, unspecified: Secondary | ICD-10-CM | POA: Diagnosis not present

## 2018-06-16 LAB — CBC
HCT: 33.6 % — ABNORMAL LOW (ref 36.0–46.0)
Hemoglobin: 11.3 g/dL — ABNORMAL LOW (ref 12.0–15.0)
MCH: 31.2 pg (ref 26.0–34.0)
MCHC: 33.6 g/dL (ref 30.0–36.0)
MCV: 92.8 fL (ref 80.0–100.0)
Platelets: 200 10*3/uL (ref 150–400)
RBC: 3.62 MIL/uL — ABNORMAL LOW (ref 3.87–5.11)
RDW: 13.3 % (ref 11.5–15.5)
WBC: 8 10*3/uL (ref 4.0–10.5)
nRBC: 0 % (ref 0.0–0.2)

## 2018-06-16 LAB — COMPREHENSIVE METABOLIC PANEL
ALT: 14 U/L (ref 0–44)
AST: 16 U/L (ref 15–41)
Albumin: 2.7 g/dL — ABNORMAL LOW (ref 3.5–5.0)
Alkaline Phosphatase: 75 U/L (ref 38–126)
Anion gap: 8 (ref 5–15)
BUN: 6 mg/dL (ref 6–20)
CO2: 24 mmol/L (ref 22–32)
Calcium: 9 mg/dL (ref 8.9–10.3)
Chloride: 104 mmol/L (ref 98–111)
Creatinine, Ser: 0.92 mg/dL (ref 0.44–1.00)
GFR calc Af Amer: >60 mL/min (ref >60)
GFR calc non Af Amer: >60 mL/min (ref >60)
Glucose, Bld: 130 mg/dL — ABNORMAL HIGH (ref 70–99)
Potassium: 3.5 mmol/L (ref 3.5–5.1)
Sodium: 136 mmol/L (ref 135–145)
Total Bilirubin: 0.5 mg/dL (ref 0.3–1.2)
Total Protein: 6 g/dL — ABNORMAL LOW (ref 6.5–8.1)

## 2018-06-16 LAB — TYPE AND SCREEN
ABO/RH(D): O POS
Antibody Screen: NEGATIVE

## 2018-06-16 LAB — PROTEIN / CREATININE RATIO, URINE
Creatinine, Urine: 68.53 mg/dL
Protein Creatinine Ratio: 0.09 mg/mg{Cre} (ref 0.00–0.15)
Total Protein, Urine: 6 mg/dL

## 2018-06-16 MED ORDER — LACTATED RINGERS IV SOLN
INTRAVENOUS | Status: DC
  Administered 2018-06-16 – 2018-06-17 (×2): via INTRAVENOUS

## 2018-06-16 MED ORDER — LACTATED RINGERS IV SOLN: 500.0000 mL | INTRAVENOUS | Status: DC | PRN

## 2018-06-16 MED ORDER — OXYCODONE-ACETAMINOPHEN 5-325 MG PO TABS: 1.0000 | ORAL_TABLET | ORAL | Status: DC | PRN

## 2018-06-16 MED ORDER — FENTANYL CITRATE (PF) 100 MCG/2ML IJ SOLN
100.0000 ug | INTRAMUSCULAR | Status: DC | PRN
  Administered 2018-06-17 (×4): 100 ug via INTRAVENOUS
  Filled 2018-06-16 (×4): qty 2

## 2018-06-16 MED ORDER — LIDOCAINE HCL (PF) 1 % IJ SOLN: 30.0000 mL | INTRAMUSCULAR | Status: DC | PRN

## 2018-06-16 MED ORDER — SOD CITRATE-CITRIC ACID 500-334 MG/5ML PO SOLN: 30.0000 mL | ORAL | Status: DC | PRN

## 2018-06-16 MED ORDER — MISOPROSTOL 50MCG HALF TABLET
50.0000 ug | ORAL_TABLET | ORAL | Status: DC | PRN
  Administered 2018-06-17: 50 ug via BUCCAL
  Filled 2018-06-16: qty 1

## 2018-06-16 MED ORDER — ONDANSETRON HCL 4 MG/2ML IJ SOLN
4.0000 mg | Freq: Four times a day (QID) | INTRAMUSCULAR | Status: DC | PRN
  Administered 2018-06-17 (×2): 4 mg via INTRAVENOUS
  Filled 2018-06-16 (×3): qty 2

## 2018-06-16 MED ORDER — OXYTOCIN 40 UNITS IN NORMAL SALINE INFUSION - SIMPLE MED: 2.5000 [IU]/h | INTRAVENOUS | Status: DC

## 2018-06-16 MED ORDER — TERBUTALINE SULFATE 1 MG/ML IJ SOLN: 0.2500 mg | Freq: Once | INTRAMUSCULAR | Status: DC | PRN

## 2018-06-16 MED ORDER — OXYCODONE-ACETAMINOPHEN 5-325 MG PO TABS: 2.0000 | ORAL_TABLET | ORAL | Status: DC | PRN

## 2018-06-16 MED ORDER — ACETAMINOPHEN 325 MG PO TABS: 650.0000 mg | ORAL_TABLET | ORAL | Status: DC | PRN

## 2018-06-16 MED ORDER — OXYTOCIN BOLUS FROM INFUSION: 500.0000 mL | Freq: Once | INTRAVENOUS | Status: DC

## 2018-06-16 NOTE — H&P (Signed)
Victoria Robinson is a 33 y.o. female [phone] @ 37.1wks by LMP presenting for IOL due to gHTN. Reports blurry vision yesterday but that has resolved; no H/A, no RUQ pain. Denies leaking or bldg. Reports irreg ctx. Had an elevated BP yesterday- taken by a family member, then had an elevated BP today during her televisit and was told to come to MAU for eval. Her preg has been followed by the [facility] service and has been remarkable for:  # gHTN- dx today # HSV2- no s/s outbreak # obesity- BMI 52 # EFW 6lb 12oz >90% at 35wks  OB History    Gravida  4   Para  2   Term  2   Preterm      AB  1   Living  2     SAB  1   TAB      Ectopic      Multiple      Live Births  2          Past Medical History:  Diagnosis Date  . Abnormal Pap smear    repeat WNL  . Asthma childhood  . Shortness of breath    Past Surgical History:  Procedure Laterality Date  . APPENDECTOMY     Family History: family history includes Diabetes in her maternal grandmother and paternal grandmother; Hyperlipidemia in her father and mother; Hypertension in her father. Social History:  reports that she has quit smoking. Her smoking use included cigarettes. She smoked 0.50 packs per day. She has never used smokeless tobacco. She reports previous alcohol use. She reports that she does not use drugs.     Maternal Diabetes: No Genetic Screening: Normal Maternal Ultrasounds/Referrals: Normal Fetal Ultrasounds or other Referrals:  None Maternal Substance Abuse:  No Significant Maternal Medications:  None Significant Maternal Lab Results:  Lab values include: Group B Strep negative Other Comments:  None  ROS History Dilation: 1.5 Effacement (%): Thick Exam by:: Philipp Deputy, CNM Blood pressure (!) 142/92, pulse 94, temperature 98.6 F (37 C), temperature source Oral, resp. rate 18, height 5\' 1"  (1.549 m), weight 125.6 kg, last menstrual period 09/29/2017, SpO2 100 %, currently  breastfeeding. Exam Physical Exam  Constitutional: She is oriented to person, place, and time. She appears well-developed.  HENT:  Head: Normocephalic.  Neck: Normal range of motion.  Cardiovascular: Normal rate.  Respiratory: Effort normal.  GI:  EFM 140s, +accels, no decels Ctx irreg, mild Bedside U/S: oblique presentation, head towards pt R  Musculoskeletal: Normal range of motion.  Neurological: She is alert and oriented to person, place, and time.  Skin: Skin is warm and dry.  Psychiatric: She has a normal mood and affect. Her behavior is normal. Thought content normal.    CBC    Component Value Date/Time   WBC 8.0 06/16/2018 2144   RBC 3.62 (L) 06/16/2018 2144   HGB 11.3 (L) 06/16/2018 2144   HGB 11.8 04/19/2018 0837   HCT 33.6 (L) 06/16/2018 2144   HCT 35.5 04/19/2018 0837   PLT 200 06/16/2018 2144   PLT 238 04/19/2018 0837   MCV 92.8 06/16/2018 2144   MCV 92 04/19/2018 0837   MCH 31.2 06/16/2018 2144   MCHC 33.6 06/16/2018 2144   RDW 13.3 06/16/2018 2144   RDW 13.1 04/19/2018 0837   LYMPHSABS 2.3 12/09/2017 1611   MONOABS 0.5 08/30/2012 1111   EOSABS 0.1 12/09/2017 1611   BASOSABS 0.0 12/09/2017 1611   CMP     Component Value  Date/Time   NA 136 06/16/2018 2144   K 3.5 06/16/2018 2144   CL 104 06/16/2018 2144   CO2 24 06/16/2018 2144   GLUCOSE 130 (H) 06/16/2018 2144   BUN 6 06/16/2018 2144   CREATININE 0.92 06/16/2018 2144   CALCIUM 9.0 06/16/2018 2144   PROT 6.0 (L) 06/16/2018 2144   ALBUMIN 2.7 (L) 06/16/2018 2144   AST 16 06/16/2018 2144   ALT 14 06/16/2018 2144   ALKPHOS 75 06/16/2018 2144   BILITOT 0.5 06/16/2018 2144   GFRNONAA >60 06/16/2018 2144   GFRAA >60 06/16/2018 2144   Urine P/C ratio: 0.09  Prenatal labs: ABO, Rh: O/Positive/-- (11/06 1611) Antibody: Negative (11/06 1611) Rubella: 4.84 (11/06 1611) RPR: Non Reactive (03/16 0837)  HBsAg: Negative (11/06 1611)  HIV: Non Reactive (03/16 0837)  GBS: Negative (05/06 0224)    Assessment/Plan: IUP@37 .1wks gHTN- elevated creatine 0.92 Oblique presentation Cx unfavorable  # Admit to Labor and Delivery # Plan IOL with cytotec, cervical foley, and Pit/AROM prn # Rev'd fetal presentation with Dr Despina HiddenEure: will begin induction process with abd binder on pt, then reassess as cx dilates # GBS pcr and Covid tests both negative   [name] CNM 06/16/2018, 11:07 PM

## 2018-06-16 NOTE — Progress Notes (Signed)
Pt presents for webex visit. Pt identified with two pt identifiers. She is currently [redacted]w[redacted]d. She states that she did have a headache with blurred vision yesterday which made her check her bp which was 146/83. She also states that she has a little bit of swelling in her feet. Pt states that she feels fine today. Her bp today is 144/80. Pt states that she has been getting nauseous the last couple of days. She has no other concerns.

## 2018-06-16 NOTE — Progress Notes (Signed)
TELEHEALTH VIRTUAL OBSTETRICS PRENATAL VISIT ENCOUNTER NOTE  I connected with Victoria Robinson on 06/16/18 at  4:00 PM EDT by WebEx at home and verified that I am speaking with the correct person using two identifiers.   I discussed the limitations, risks, security and privacy concerns of performing an evaluation and management service by telephone and the availability of in person appointments. I also discussed with the patient that there may be a patient responsible charge related to this service. The patient expressed understanding and agreed to proceed. Subjective:  Victoria Robinson is a 33 y.o. [id number] at [redacted]w[redacted]d being seen today for ongoing prenatal care.  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  Patient reports swelling of feet, headache and blurred vision over the past 2 days.  Reports fetal movement. Contractions: Irregular. Vag. Bleeding: None.  Movement: Present. Denies any contractions, bleeding or leaking of fluid.   The following portions of the patient's history were reviewed and updated as appropriate: allergies, current medications, past family history, past medical history, past social history, past surgical history and problem list.   Objective:   Vitals:   06/16/18 1611  BP: (!) 144/80    Fetal Status:     Movement: Present     [place] MFM OB FOLLOW UP (Accession [id number]) (Order [id number])  Imaging  Date: 05/31/2018 Department: [facility] MATERNAL FETAL CARE ULTRASOUND Released By: Elesa Hacker Authorizing: Georgana Curio, MD  Exam Information   Status Exam Begun  Exam Ended   Final [99] 05/31/2018 3:13 PM 05/31/2018 3:53 PM  PACS Images   Show  images for [place] MFM OB FOLLOW UP  Study Result   ----------------------------------------------------------------------  OBSTETRICS REPORT                       (Signed Final 05/31/2018 06:12 pm) ---------------------------------------------------------------------- Patient Info  ID #:       [id number]                          D.O.B.:  1985-12-07 (32 yrs)  Name:       Victoria Robinson              Visit Date: 05/31/2018 03:15 pm ---------------------------------------------------------------------- Performed By  Performed By:     Percell Boston          Ref. Address:     [address]                    [address]                                                             Road  Attending:        Lin Landsman      [facility]  Referred By:      Judeth Horn                    CNM ---------------------------------------------------------------------- Orders   #  Description                          Code         Ordered By   1  [place] MFM OB FOLLOW UP                  16109.60     Lin Landsman  ----------------------------------------------------------------------   #  Order #                    Accession #                 Episode #   1  [id number]                  [id number]                  [id number]  ---------------------------------------------------------------------- Indications   [redacted] weeks gestation of pregnancy                Z3A.34   Maternal morbid obesity                        O99.210 E66.01   Encounter for other antenatal screening        Z36.2   follow-up (low risk NIPS, AFP neg)  ---------------------------------------------------------------------- Vital Signs                                                 Height:        5'1" ---------------------------------------------------------------------- Fetal Evaluation  Num  Of Fetuses:         1  Fetal Heart Rate(bpm):  155  Cardiac Activity:       Observed  Presentation:           Cephalic  Placenta:               Posterior  P. Cord Insertion:      Previously Visualized  Amniotic Fluid  AFI FV:      Within normal limits  AFI Sum(cm)     %Tile       Largest Pocket(cm)  17.22           63          5.59  RUQ(cm)       RLQ(cm)       LUQ(cm)        LLQ(cm)  5.59          2.74          3.32           5.57 ---------------------------------------------------------------------- Biometry  BPD:      91.3  mm     G. Age:  37w 0d  95  %    CI:        79.43   %    70 - 86                                                          FL/HC:      21.2   %    20.1 - 22.3  HC:      323.8  mm     G. Age:  36w 5d         60  %    HC/AC:      0.94        0.93 - 1.11  AC:      343.4  mm     G. Age:  38w 2d       > 97  %    FL/BPD:     75.4   %    71 - 87  FL:       68.8  mm     G. Age:  35w 2d         55  %    FL/AC:      20.0   %    20 - 24  Est. FW:    3157  gm    6 lb 15 oz    > 90  % ---------------------------------------------------------------------- OB History  Gravidity:    4         Term:   2        Prem:   0        SAB:   1  TOP:          0       Ectopic:  0        Living: 2 ---------------------------------------------------------------------- Gestational Age  LMP:           34w 6d        Date:  09/29/17                 EDD:   07/06/18  U/S Today:     36w 6d                                        EDD:   06/22/18  Best:          34w 6d     Det. By:  LMP  (09/29/17)          EDD:   07/06/18 ---------------------------------------------------------------------- Anatomy  Cranium:               Appears normal         Aortic Arch:            Previously seen  Cavum:                 Previously seen        Ductal Arch:            Previously seen  Ventricles:            Appears normal         Diaphragm:              Previously seen  Choroid Plexus:        Previously  seen        Stomach:                Appears normal, left                                                                        sided  Cerebellum:            Previously seen        Abdomen:                Previously seen  Posterior Fossa:       Previously seen        Abdominal Wall:         Previously seen  Nuchal Fold:           Not applicable (>20    Cord Vessels:           Previously seen                         wks GA)  Face:                  Orbits and profile     Kidneys:                Appear normal                         previously seen  Lips:                  Previously seen        Bladder:                Appears normal  Thoracic:              Appears normal         Spine:                  Previously seen  Heart:                 Previously seen        Upper Extremities:      Previously seen  RVOT:                  Previously seen        Lower Extremities:      Previously seen  LVOT:                  Previously seen  Other:  Fetus appears to be a female. Heels, feet, and LT 5th digit visualized          previously. Nasal bone visualized previously. ---------------------------------------------------------------------- Cervix Uterus Adnexa  Cervix  Not visualized (advanced GA >24wks)  Uterus  No abnormality visualized.  Left Ovary  No adnexal mass visualized.  Right Ovary  No adnexal mass visualized.  Cul De Sac  No free fluid seen.  Adnexa  No abnormality visualized. ---------------------------------------------------------------------- Impression  Nuchal fold previously enlarged 6.2 mm  Normal growth EFW >90%  ---------------------------------------------------------------------- Recommendations  Follow up in 3-4 weeks ----------------------------------------------------------------------  Lin Landsmanorenthian Booker, MD Electronically Signed Final Report   05/31/2018 06:12 pm ----------------------------------------------------------------------   Physical  Exam:  General:  Alert, oriented and cooperative. Patient is in no acute distress.  Respiratory: Normal respiratory effort, no problems with respiration noted  Mental Status: Normal mood and affect. Normal behavior. Normal judgment and thought content.  Rest of physical exam deferred due to type of encounter   Assessment and Plan:  Pregnancy: [id number] at 4738w1d  1. Supervision of other normal pregnancy, antepartum  2. Elevated blood pressure affecting pregnancy, antepartum - patient sent to hospital for evaluation  3. Class 3 severe obesity with body mass index (BMI) of 45.0 to 49.9 in adult, unspecified obesity type, unspecified whether serious comorbidity present (HCC)  4. Excessive fetal growth affecting management of pregnancy in third trimester, single or unspecified fetus - EFW > 90th percentile ( AC > 97th percentile ) - consider IOL at 39 weeks   There are no diagnoses linked to this encounter. Term labor symptoms and general obstetric precautions including but not limited to vaginal bleeding, contractions, leaking of fluid and fetal movement were reviewed in detail with the patient. I discussed the assessment and treatment plan with the patient. The patient was provided an opportunity to ask questions and all were answered. The patient agreed with the plan and demonstrated an understanding of the instructions. The patient was advised to call back or seek an in-person office evaluation/[facility] for any urgent or concerning symptoms. Please refer to After Visit Summary for other counseling recommendations.   I provided 10 minutes of face-to-face via WebEx time during this encounter.    Future Appointments  Date Time Provider Department Center  06/29/2018  3:30 PM WH-MFC US 5 WH-MFCUS MFC-US    Coral Ceoharles Linnaea Ahn, MD [facility] 06-16-2018

## 2018-06-16 NOTE — MAU Note (Signed)
Pt here with c/o increased BP and contractions. Denies any bleeding or leaking. Reports good fetal movement. Had e-visit today and doctor wanted her to come to be checked.

## 2018-06-17 ENCOUNTER — Encounter (HOSPITAL_COMMUNITY): Payer: Self-pay

## 2018-06-17 ENCOUNTER — Inpatient Hospital Stay (HOSPITAL_COMMUNITY): Payer: BLUE CROSS/BLUE SHIELD | Admitting: Anesthesiology

## 2018-06-17 ENCOUNTER — Other Ambulatory Visit: Payer: Self-pay

## 2018-06-17 DIAGNOSIS — O134 Gestational [pregnancy-induced] hypertension without significant proteinuria, complicating childbirth: Secondary | ICD-10-CM

## 2018-06-17 DIAGNOSIS — Z3A37 37 weeks gestation of pregnancy: Secondary | ICD-10-CM

## 2018-06-17 LAB — CBC
HCT: 35.9 % — ABNORMAL LOW (ref 36.0–46.0)
HCT: 36.1 % (ref 36.0–46.0)
Hemoglobin: 11.9 g/dL — ABNORMAL LOW (ref 12.0–15.0)
Hemoglobin: 11.8 g/dL — ABNORMAL LOW (ref 12.0–15.0)
MCH: 31 pg (ref 26.0–34.0)
MCH: 30.9 pg (ref 26.0–34.0)
MCHC: 33.1 g/dL (ref 30.0–36.0)
MCHC: 32.7 g/dL (ref 30.0–36.0)
MCV: 93.5 fL (ref 80.0–100.0)
MCV: 94.5 fL (ref 80.0–100.0)
Platelets: 151 10*3/uL (ref 150–400)
Platelets: 169 10*3/uL (ref 150–400)
RBC: 3.84 MIL/uL — ABNORMAL LOW (ref 3.87–5.11)
RBC: 3.82 MIL/uL — ABNORMAL LOW (ref 3.87–5.11)
RDW: 13.6 % (ref 11.5–15.5)
RDW: 13.4 % (ref 11.5–15.5)
WBC: 8.3 10*3/uL (ref 4.0–10.5)
WBC: 9.7 10*3/uL (ref 4.0–10.5)
nRBC: 0 % (ref 0.0–0.2)
nRBC: 0 % (ref 0.0–0.2)

## 2018-06-17 LAB — SARS CORONAVIRUS 2 BY RT PCR (HOSPITAL ORDER, PERFORMED IN ~~LOC~~ HOSPITAL LAB): SARS Coronavirus 2: NEGATIVE

## 2018-06-17 LAB — RPR: RPR Ser Ql: NONREACTIVE

## 2018-06-17 LAB — ABO/RH: ABO/RH(D): O POS

## 2018-06-17 LAB — GROUP B STREP BY PCR: Group B strep by PCR: NEGATIVE

## 2018-06-17 MED ORDER — HYDRALAZINE HCL 20 MG/ML IJ SOLN: 5.0000 mg | INTRAMUSCULAR | Status: DC | PRN

## 2018-06-17 MED ORDER — OXYTOCIN 40 UNITS IN NORMAL SALINE INFUSION - SIMPLE MED
1.0000 m[IU]/min | INTRAVENOUS | Status: DC
  Administered 2018-06-17: 05:00:00 2 m[IU]/min via INTRAVENOUS
  Filled 2018-06-17: qty 1000

## 2018-06-17 MED ORDER — LIDOCAINE HCL (PF) 1 % IJ SOLN
INTRAMUSCULAR | Status: DC | PRN
  Administered 2018-06-17: 5 mL via EPIDURAL

## 2018-06-17 MED ORDER — DIPHENHYDRAMINE HCL 25 MG PO CAPS: 25.0000 mg | ORAL_CAPSULE | Freq: Four times a day (QID) | ORAL | Status: DC | PRN

## 2018-06-17 MED ORDER — ONDANSETRON HCL 4 MG PO TABS: 4.0000 mg | ORAL_TABLET | ORAL | Status: DC | PRN

## 2018-06-17 MED ORDER — ACETAMINOPHEN 325 MG PO TABS
650.0000 mg | ORAL_TABLET | ORAL | Status: DC | PRN
  Administered 2018-06-17 – 2018-06-18 (×3): 650 mg via ORAL
  Filled 2018-06-17 (×3): qty 2

## 2018-06-17 MED ORDER — DIPHENHYDRAMINE HCL 50 MG/ML IJ SOLN: 12.5000 mg | INTRAMUSCULAR | Status: DC | PRN

## 2018-06-17 MED ORDER — PHENYLEPHRINE 40 MCG/ML (10ML) SYRINGE FOR IV PUSH (FOR BLOOD PRESSURE SUPPORT): 80.0000 ug | PREFILLED_SYRINGE | INTRAVENOUS | Status: DC | PRN

## 2018-06-17 MED ORDER — SIMETHICONE 80 MG PO CHEW: 80.0000 mg | CHEWABLE_TABLET | ORAL | Status: DC | PRN

## 2018-06-17 MED ORDER — LACTATED RINGERS IV SOLN
500.0000 mL | Freq: Once | INTRAVENOUS | Status: AC
  Administered 2018-06-17: 14:00:00 500 mL via INTRAVENOUS

## 2018-06-17 MED ORDER — COCONUT OIL OIL: 1.0000 "application " | TOPICAL_OIL | Status: DC | PRN

## 2018-06-17 MED ORDER — HYDRALAZINE HCL 20 MG/ML IJ SOLN: 10.0000 mg | INTRAMUSCULAR | Status: DC | PRN

## 2018-06-17 MED ORDER — BENZOCAINE-MENTHOL 20-0.5 % EX AERO
1.0000 "application " | INHALATION_SPRAY | CUTANEOUS | Status: DC | PRN
  Administered 2018-06-18: 1 via TOPICAL
  Filled 2018-06-17: qty 56

## 2018-06-17 MED ORDER — IBUPROFEN 600 MG PO TABS
600.0000 mg | ORAL_TABLET | Freq: Four times a day (QID) | ORAL | Status: DC
  Administered 2018-06-17 – 2018-06-19 (×6): 600 mg via ORAL
  Filled 2018-06-17 (×6): qty 1

## 2018-06-17 MED ORDER — DIBUCAINE (PERIANAL) 1 % EX OINT: 1.0000 "application " | TOPICAL_OINTMENT | CUTANEOUS | Status: DC | PRN

## 2018-06-17 MED ORDER — LABETALOL HCL 5 MG/ML IV SOLN: 40.0000 mg | INTRAVENOUS | Status: DC | PRN

## 2018-06-17 MED ORDER — ZOLPIDEM TARTRATE 5 MG PO TABS: 5.0000 mg | ORAL_TABLET | Freq: Every evening | ORAL | Status: DC | PRN

## 2018-06-17 MED ORDER — EPHEDRINE 5 MG/ML INJ: 10.0000 mg | INTRAVENOUS | Status: DC | PRN

## 2018-06-17 MED ORDER — ONDANSETRON HCL 4 MG/2ML IJ SOLN: 4.0000 mg | INTRAMUSCULAR | Status: DC | PRN

## 2018-06-17 MED ORDER — WITCH HAZEL-GLYCERIN EX PADS: 1.0000 "application " | MEDICATED_PAD | CUTANEOUS | Status: DC | PRN

## 2018-06-17 MED ORDER — SENNOSIDES-DOCUSATE SODIUM 8.6-50 MG PO TABS
2.0000 | ORAL_TABLET | ORAL | Status: DC
  Administered 2018-06-17 – 2018-06-18 (×2): 2 via ORAL
  Filled 2018-06-17 (×2): qty 2

## 2018-06-17 MED ORDER — TETANUS-DIPHTH-ACELL PERTUSSIS 5-2.5-18.5 LF-MCG/0.5 IM SUSP: 0.5000 mL | Freq: Once | INTRAMUSCULAR | Status: DC

## 2018-06-17 MED ORDER — LABETALOL HCL 5 MG/ML IV SOLN: 20.0000 mg | INTRAVENOUS | Status: DC | PRN

## 2018-06-17 MED ORDER — PRENATAL MULTIVITAMIN CH
1.0000 | ORAL_TABLET | Freq: Every day | ORAL | Status: DC
  Administered 2018-06-18: 1 via ORAL
  Filled 2018-06-17: qty 1

## 2018-06-17 MED ORDER — FENTANYL-BUPIVACAINE-NACL 0.5-0.125-0.9 MG/250ML-% EP SOLN
12.0000 mL/h | EPIDURAL | Status: DC | PRN
  Filled 2018-06-17: qty 250

## 2018-06-17 MED ORDER — SODIUM CHLORIDE (PF) 0.9 % IJ SOLN
INTRAMUSCULAR | Status: DC | PRN
  Administered 2018-06-17: 12 mL/h via EPIDURAL

## 2018-06-17 NOTE — Anesthesia Procedure Notes (Signed)
Epidural Patient location during procedure: OB Start time: 06/17/2018 1:47 PM End time: 06/17/2018 2:03 PM  Staffing Anesthesiologist: Trevor Iha, MD Performed: anesthesiologist   Preanesthetic Checklist Completed: patient identified, site marked, surgical consent, pre-op evaluation, timeout performed, IV checked, risks and benefits discussed and monitors and equipment checked  Epidural Patient position: sitting Prep: site prepped and draped and DuraPrep Patient monitoring: continuous pulse ox and blood pressure Approach: midline Injection technique: LOR air  Needle:  Needle type: Tuohy  Needle gauge: 17 G Needle length: 9 cm and 9 Needle insertion depth: 8 cm Catheter type: closed end flexible Catheter size: 19 Gauge Catheter at skin depth: 14 cm Test dose: negative  Assessment Events: blood not aspirated, injection not painful, no injection resistance, negative IV test and no paresthesia  Additional Notes Patient identified. Risks/Benefits/Options discussed with patient including but not limited to bleeding, infection, nerve damage, paralysis, failed block, incomplete pain control, headache, blood pressure changes, nausea, vomiting, reactions to medication both or allergic, itching and postpartum back pain. Confirmed with bedside nurse the patient's most recent platelet count. Confirmed with patient that they are not currently taking any anticoagulation, have any bleeding history or any family history of bleeding disorders. Patient expressed understanding and wished to proceed. All questions were answered. Sterile technique was used throughout the entire procedure. Please see nursing notes for vital signs. Test dose was given through epidural needle and negative prior to continuing to dose epidural or start infusion. Warning signs of high block given to the patient including shortness of breath, tingling/numbness in hands, complete motor block, or any concerning symptoms with  instructions to call for help. Patient was given instructions on fall risk and not to get out of bed. All questions and concerns addressed with instructions to call with any issues. 1 Attempt (S) . Patient tolerated procedure well.

## 2018-06-17 NOTE — Progress Notes (Signed)
Patient ID: Victoria Robinson, female   DOB: 08-12-1985, 33 y.o.   MRN: [id number]  Up to bathroom  BP 131/71, P 78 FHR 130s, +accels, occ variables Ctx q 2-4 mins with Pit at 96mu/min Cx per RN 6/80/-3 @ 0630  IUP@37 .2wks gHTN Early active labor  Continue to titrate Pit to keep ctx reg Anticipate SVD- before AROM will verify vtx presentation and have MD assistance  Arabella Merles CNM 06/17/2018

## 2018-06-17 NOTE — Progress Notes (Signed)
Victoria Robinson is a 33 y.o. [id number] at [redacted]w[redacted]d by LMP admitted for induction of labor due to gHTN.  Subjective: Pt feeling cramping/contractions and pelvic pressure.  S/O in room for support.  Objective: BP (!) 136/97   Pulse 73   Temp (!) 97.5 F (36.4 C) (Oral)   Resp 16   Ht 5\' 1"  (1.549 m)   Wt 125.6 kg   LMP 09/29/2017 (Approximate)   SpO2 100%   BMI 52.34 kg/m  No intake/output data recorded. No intake/output data recorded.  FHT:  FHR: 135 bpm, variability: moderate,  accelerations:  Present,  decelerations:  Absent UC:   irregular, every 2-10 minutes, not tracing well, toco readjusted by RN for better tracing SVE:   Dilation: 4 Effacement (%): 50 Station: -2 Exam by:: Leftwich-Kirby  Labs: Lab Results  Component Value Date   WBC 8.3 06/17/2018   HGB 11.9 (L) 06/17/2018   HCT 35.9 (L) 06/17/2018   MCV 93.5 06/17/2018   PLT 151 06/17/2018    Assessment / Plan: Induction of labor due to gestational hypertension,  progressing well on pitocin  Labor: Progressing normally . Was reported as 6 cm, my exam is 4/50/-3 at this time.  Continue current course. Consider AROM at next exam and IUPC. Preeclampsia:  labs stable Fetal Wellbeing:  Category I Pain Control:  Labor support without medications I/D:  GBS neg Anticipated MOD:  NSVD  [name] 06/17/2018, 10:46 AM

## 2018-06-17 NOTE — Progress Notes (Signed)
Patient ID: Victoria Robinson, female   DOB: July 26, 1985, 33 y.o.   MRN: [id number]  Trying to rest; rec'd a second dose of Fentanyl at 0430; s/p cytotec x 1 dose with good progress- now on Pitocin; has abd binder on  BPs 134/90, 132/86, 127/82, P 83 FHR 130s, +accels, no decels, Cat 1 Ctx q 3-4 mins with Pit at 49mu/min Cx 3-4/60/vtx -3> vtx more palp than before  IUP@37 .2wks gHTN IOL process- latent GBS neg  Continue titrating Pit to achieve reg ctx Anticipate active labor  Arabella Merles CNM 06/17/2018 5:15 AM

## 2018-06-17 NOTE — Anesthesia Preprocedure Evaluation (Addendum)
Anesthesia Evaluation  Patient identified by MRN, date of birth, ID band Patient awake    Reviewed: Allergy & Precautions, NPO status , Patient's Chart, lab work & pertinent test results  Airway Mallampati: III  TM Distance: >3 FB Neck ROM: Full    Dental no notable dental hx. (+) Teeth Intact   Pulmonary asthma , former smoker,    Pulmonary exam normal breath sounds clear to auscultation       Cardiovascular hypertension, Pt. on medications Normal cardiovascular exam Rhythm:Regular Rate:Normal  PIH 150s/90s Blurry vision on admission   Neuro/Psych negative neurological ROS     GI/Hepatic   Endo/Other    Renal/GU      Musculoskeletal   Abdominal (+) + obese,   Peds  Hematology Hgb 11.9 Plts 151   Anesthesia Other Findings   Reproductive/Obstetrics (+) Pregnancy                            Anesthesia Physical Anesthesia Plan  ASA: IV  Anesthesia Plan: Epidural   Post-op Pain Management:    Induction:   PONV Risk Score and Plan: Treatment may vary due to age or medical condition  Airway Management Planned:   Additional Equipment:   Intra-op Plan:   Post-operative Plan:   Informed Consent: I have reviewed the patients History and Physical, chart, labs and discussed the procedure including the risks, benefits and alternatives for the proposed anesthesia with the patient or authorized representative who has indicated his/her understanding and acceptance.     Dental advisory given  Plan Discussed with:   Anesthesia Plan Comments: (G4P2 w Gest HTN (150s/90) and inc BMI (52.2) for LEA)       Anesthesia Quick Evaluation

## 2018-06-17 NOTE — Discharge Summary (Signed)
Postpartum Discharge Summary     Patient Name: Victoria Robinson DOB: 06-Nov-1985 MRN: [id number]  Date of admission: 06/16/2018 Delivering Provider: [name] A   Date of discharge: 06/19/2018  Admitting diagnosis: CTX WITH HBP  Intrauterine pregnancy: [redacted]w[redacted]d     Secondary diagnosis:  Active Problems:   Obesity   NSVD (normal spontaneous vaginal delivery)   Excessive fetal growth affecting management of mother in third trimester, antepartum   Gestational hypertension  Additional problems: None     Discharge diagnosis: Term Pregnancy Delivered                                                                                                Post partum procedures:none  Augmentation: AROM, Pitocin and Cytotec  Complications: None  Hospital course:  Induction of Labor With Vaginal Delivery   33 y.o. yo [phone] at [redacted]w[redacted]d was admitted to the hospital 06/16/2018 for induction of labor.  Indication for induction: Gestational hypertension.  Patient had an uncomplicated labor course as follows: Membrane Rupture Time/Date: 2:20 PM ,06/17/2018   Intrapartum Procedures: Episiotomy: None [1]                                         Lacerations:     Patient had delivery of a Viable infant.  Information for the patient's newborn:  Victoria Robinson [[id number]]  Delivery Method: Vaginal, Spontaneous(Filed from Delivery Summary)   06/17/2018  Details of delivery can be found in separate delivery note.  Patient had a routine postpartum course. Patient is discharged home 06/19/18.  Magnesium Sulfate recieved: No BMZ received: No  Physical exam  Vitals:   06/18/18 0830 06/18/18 1410 06/18/18 2213 06/19/18 0515  BP: 125/85 125/83 126/72 (!) 147/98  Pulse: 77 84 76 73  Resp: 18 17 17 18   Temp: 98 F (36.7 C) 98.2 F (36.8 C) 98.4 F (36.9 C) 98.9 F (37.2 C)  TempSrc: Oral Oral Oral Oral  SpO2:  100% 100% 99%  Weight:      Height:       General: alert, cooperative and no  distress Lochia: appropriate Uterine Fundus: firm, midline, U-2 DVT Evaluation: No evidence of DVT seen on physical exam. Negative Homan's sign. No cords or calf tenderness. No significant calf/ankle edema. Labs: Lab Results  Component Value Date   WBC 9.7 06/17/2018   HGB 11.8 (L) 06/17/2018   HCT 36.1 06/17/2018   MCV 94.5 06/17/2018   PLT 169 06/17/2018   CMP Latest Ref Rng & Units 06/18/2018  Glucose 70 - 99 mg/dL 90  BUN 6 - 20 mg/dL 6  Creatinine 0.38 - 8.82 mg/dL 8.00  Sodium 349 - 179 mmol/L 136  Potassium 3.5 - 5.1 mmol/L 4.1  Chloride 98 - 111 mmol/L 107  CO2 22 - 32 mmol/L 23  Calcium 8.9 - 10.3 mg/dL 1.5(A)  Total Protein 6.5 - 8.1 g/dL -  Total Bilirubin 0.3 - 1.2 mg/dL -  Alkaline Phos 38 - 569 U/L -  AST 15 -  41 U/L -  ALT 0 - 44 U/L -    Discharge instruction: per After Visit Summary and "Baby and Me Booklet".  After visit meds:  Allergies as of 06/19/2018   No Known Allergies     Medication List    STOP taking these medications   Comfort Fit Maternity Supp Lg Misc   cyclobenzaprine 10 MG tablet Commonly known as:  FLEXERIL   valACYclovir 500 MG tablet Commonly known as:  Valtrex     TAKE these medications   acetaminophen 325 MG tablet Commonly known as:  TYLENOL Take 650 mg by mouth every 6 (six) hours as needed for moderate pain.   amLODipine 5 MG tablet Commonly known as:  NORVASC Take 1 tablet (5 mg total) by mouth daily.   ibuprofen 600 MG tablet Commonly known as:  ADVIL Take 1 tablet (600 mg total) by mouth every 6 (six) hours.   ondansetron 4 MG tablet Commonly known as:  ZOFRAN Take 4 mg by mouth every 8 (eight) hours as needed for nausea or vomiting.   PNV PO Take 1 tablet by mouth daily.       Diet: routine diet  Activity: Advance as tolerated. Pelvic rest for 6 weeks.   Outpatient follow up:1 week for BP check Follow up Appt: Future Appointments  Date Time Provider Department Center  06/25/2018  8:15 AM [facility]  NURSE [facility] None  07/16/2018  8:45 AM Adam PhenixArnold, James G, MD [facility] None   Follow up Visit: Follow-up Information    [facility] on [facility].   Specialty:  Obstetrics and Gynecology Why:  For Blood Pressure check at 8:15 AM. Then on 07/16/2018 at 8:45 AM for postpartum visit and Nexplanon insertion. Contact information: [address], [address] [place] [id number] [phone]           Please schedule this patient for PP visit in: 4 weeks Low risk pregnancy complicated by: GHTN at term Delivery mode:  SVD Anticipated Birth Control:  Plans BTL, will consider Nexplanon PP Procedures needed: BP check in 1 week  Schedule Integrated BH visit: no Provider: Any provider    Newborn Data: Live born female "Victoria Robinson" Birth Weight: 7 lb 7.7 oz (3393 g) APGAR: 9, 9  Newborn Delivery   Birth date/time:  06/17/2018 17:10:00 Delivery type:  Vaginal, Spontaneous     Baby Feeding: Breast Disposition:home with mother   06/19/2018 Victoria Robinson, CNM

## 2018-06-18 LAB — BASIC METABOLIC PANEL
Anion gap: 6 (ref 5–15)
BUN: 6 mg/dL (ref 6–20)
CO2: 23 mmol/L (ref 22–32)
Calcium: 8.8 mg/dL — ABNORMAL LOW (ref 8.9–10.3)
Chloride: 107 mmol/L (ref 98–111)
Creatinine, Ser: 0.73 mg/dL (ref 0.44–1.00)
GFR calc Af Amer: >60 mL/min (ref >60)
GFR calc non Af Amer: >60 mL/min (ref >60)
Glucose, Bld: 90 mg/dL (ref 70–99)
Potassium: 4.1 mmol/L (ref 3.5–5.1)
Sodium: 136 mmol/L (ref 135–145)

## 2018-06-18 NOTE — Lactation Note (Signed)
This note was copied from a baby's chart. Lactation Consultation Note  Patient Name: Victoria Robinson [id number] Date: 06/18/2018  P3, 7 hour female infant. LC entered room mom and infant asleep. Dad awake in room. LC quietly mention LC services with return later.   Maternal Data    Feeding Feeding Type: Bottle Fed - Formula Nipple Type: Slow - flow  LATCH Score                   Interventions    Lactation Tools Discussed/Used     Consult Status      Victoria Robinson 06/18/2018, 12:30 AM

## 2018-06-18 NOTE — Lactation Note (Addendum)
This note was copied from a baby's chart. Lactation Consultation Note  Patient Name: Victoria Robinson HYQMV'[id number] Date: 06/18/2018 Reason for consult: Initial assessment;Early term 37-38.6wks P3, 10 hour female infant. Mom's feeding choice at admission is breast and formula feeding.  Per mom, she has DEBP at home. Infant has been spitty, after taking 5 ml of colostrum by spoon infant has large frothy emesis. Per mom, infant has not been latching well maybe 5 minutes in L&D. Per mom, she pumped with her other two children due to latch difficulties " they would not latch to my breast", she pumped 6 months with first child and  4 months with 2nd child. LC notice mom has large breast that are flat with some edema. Mom attempted latch infant on left breast using football hold, infant was reluctant to latch at this time. Mom given breast shells to wear in bra during the day and mom knows not to sleep in breast shells at night. Mom given harmony hand pump to pre-pump breast prior to latching infant. Mom knows to hand express and give back EBM if infant is reluctant to latch and continue to do STS as much as possible. Mom knows to breastfeed according hunger cues, 8-12 times within 24 hours. LC discussed I & O. Reviewed Baby & Me book's Breastfeeding Basics.  Mom made aware of O/P services, breastfeeding support groups, community resources, and our phone # for post-discharge questions.  Maternal Data Formula Feeding for Exclusion: Yes Reason for exclusion: Mother's choice to formula and breast feed on admission Has patient been taught Hand Expression?: Yes(Infant was given 5 ml of colostrum on spoon. ) Does the patient have breastfeeding experience prior to this delivery?: Yes  Feeding Feeding Type: Breast Fed  LATCH Score Latch: Too sleepy or reluctant, no latch achieved, no sucking elicited.  Audible Swallowing: None  Type of Nipple: Flat  Comfort (Breast/Nipple): Soft /  non-tender  Hold (Positioning): Assistance needed to correctly position infant at breast and maintain latch.  LATCH Score: 4  Interventions Interventions: Breast feeding basics reviewed;Assisted with latch;Skin to skin;Breast massage;Hand express;Support pillows;Adjust position;Shells;Hand pump;Position options;Breast compression  Lactation Tools Discussed/Used Tools: Shells;Pump Shell Type: Other (comment)(flat nipples ) Breast pump type: Manual Pump Review: Setup, frequency, and cleaning;Milk Storage Initiated by:: Danelle Earthly, IBCLC Date initiated:: 06/18/18   Consult Status Consult Status: Follow-up Date: 06/18/18 Follow-up type: In-patient    Danelle Earthly 06/18/2018, 3:50 AM

## 2018-06-18 NOTE — Anesthesia Postprocedure Evaluation (Signed)
Anesthesia Post Note  Patient: Victoria Robinson  Procedure(s) Performed: AN AD HOC LABOR EPIDURAL     Patient location during evaluation: Mother Baby Anesthesia Type: Epidural Level of consciousness: awake Pain management: satisfactory to patient Vital Signs Assessment: post-procedure vital signs reviewed and stable Respiratory status: spontaneous breathing Cardiovascular status: stable Anesthetic complications: no    Last Vitals:  Vitals:   06/18/18 0000 06/18/18 0400  BP: 135/82 (!) 135/91  Pulse: 80 78  Resp: 20 18  Temp: 36.4 C 36.9 C  SpO2: 100% 99%    Last Pain:  Vitals:   06/18/18 0400  TempSrc: Oral  PainSc: 5    Pain Goal:                   [employer]

## 2018-06-18 NOTE — Progress Notes (Signed)
Post Partum Day 1 Subjective: no complaints, up ad lib, voiding and tolerating PO  Objective: Blood pressure (!) 135/91, pulse 78, temperature 98.4 F (36.9 C), temperature source Oral, resp. rate 18, height 5\' 1"  (1.549 m), weight 125.6 kg, last menstrual period 09/29/2017, SpO2 99 %, unknown if currently breastfeeding.  Physical Exam:  General: alert and cooperative Lochia: appropriate Uterine Fundus: firm Incision: N/A DVT Evaluation: No cords or calf tenderness. No significant calf/ankle edema.  Recent Labs    06/17/18 0810 06/17/18 1825  HGB 11.9* 11.8*  HCT 35.9* 36.1    Assessment/Plan: Plan for discharge tomorrow, Breastfeeding, Circumcision prior to discharge and Contraception desires BTL, considering Nexplanon at [facility] as interim   LOS: 2 days   Vonzella Nipple, PA-C 06/18/2018, 8:33 AM

## 2018-06-19 MED ORDER — AMLODIPINE BESYLATE 5 MG PO TABS: 5.0000 mg | ORAL_TABLET | Freq: Every day | ORAL | 1 refills | Status: DC

## 2018-06-19 MED ORDER — IBUPROFEN 600 MG PO TABS: 600.0000 mg | ORAL_TABLET | Freq: Four times a day (QID) | ORAL | 1 refills | Status: DC

## 2018-06-19 NOTE — Lactation Note (Signed)
This note was copied from a baby's chart. Lactation Consultation Note  Patient Name: Victoria Robinson [id number] Date: 06/19/2018 Reason for consult: Follow-up assessment;Term(discharge)  1133 - 1148 - I visited Ms. Morea to conduct discharge education. She indicated that her feeding plan is to pump and bottle feed. She did this with her previous children and prefers to pump over breast feed. Mom has a new Medela DEBP at home. We reviewed feeding frequency, and I reviewed milk storage guidelines in her booklet. I also discussed safe preparation of frozen breast milk and recommended that she review guidelines on how to safely prepare formula. Mom indicated that she may supplement with formula in addition to breast milk until her milk volume increases.  Mom has a history of engorgement. I educated on ways to manage engorgement as well as clogged milk ducts.   Mom plans to follow up with her pediatrician this Monday. I reviewed signs that baby is getting enough to eat and output expectations for days 3 and 4. I recommended that mom stay dedicated to pumping at night to help maintain her milk volume, and mom verbalized understanding.  I shared our community breast feeding resources and recommended that she call if she has any questions or concerns following discharge.  No further questions at this time. I brought mom some ice upon request.  Maternal Data Formula Feeding for Exclusion: No Has patient been taught Hand Expression?: Yes Does the patient have breastfeeding experience prior to this delivery?: Yes   Interventions Interventions: Breast feeding basics reviewed  Lactation Tools Discussed/Used Pump Review: Setup, frequency, and cleaning;Milk Storage   Consult Status Consult Status: Complete Date: 06/19/18 Follow-up type: Call as needed    Walker Shadow 06/19/2018, 12:30 PM

## 2018-06-19 NOTE — Lactation Note (Signed)
This note was copied from a baby's chart. Lactation Consultation Note  Patient Name: Victoria Robinson [id number] Date: 06/19/2018 Reason for consult: Follow-up assessment;Early term 37-38.6wks;Difficult latch P3, 34 hour female infant weight loss -3%. Mom feeding choice at admission was  Breast and bottle. Mom decided she doesn't want to latch infant to breast only use DEBP in hospital and supplement with formula.   Mom does have a DEBP at home. LC discussed with mom if she decides she  want to latch infant to breast again while in hospital  [facility] services is available to help and assist her with latching infant to breast.  Maternal Data    Feeding    LATCH Score                   Interventions    Lactation Tools Discussed/Used     Consult Status      Victoria Robinson 06/19/2018, 3:25 AM

## 2018-06-19 NOTE — Discharge Instructions (Signed)
Breast Pumping Tips There may be times when you cannot feed your baby from your breast, such as when you are at work or on a trip. Breast pumping allows you to remove milk from your breast in order to store for later use. There are three ways to pump. You can use:  Your hand to massage and squeeze your breast (hand expression).  A handheld manual pump.  An electric pump. When you first start to pump, you may not get much milk, but after a few days your breasts should start to make more. Pumping can help stimulate your milk supply after your baby is born. It can also help maintain your milk supply when you are away from your baby. When should I pump? You can start pumping soon after your baby is born. Here are some tips on when to pump:  When with your baby: ? Pump after breastfeeding. ? Pump from the free breast while you breastfeed.  When away from your baby: ? Pump every 2-3 hours for about 15 minutes. ? Pump both breasts at the same time if you can.  If your baby gets formula feeding, pump around the time your baby gets that feeding.  If you drank alcohol, wait 2 hours before pumping.  If you are having a procedure with anesthesia, talk to your health care provider about when you should pump before and after. How do I prepare to pump? Take steps to relax. This makes it easier to stimulate your let-down reflex, which is what makes breast milk flow. To help:  Smell one of your infant's blankets or an item of clothing.  Look at a picture or video of your infant.  Sit in a quiet, private space.  Massage your breast and nipple.  Place a warm cloth on your breast. The cloth should be a little wet.  Play relaxing music.  Picture your milk flowing. What are some tips? General tips for pumping breast milk  Always wash your hands before pumping.  If you are not getting very much milk or pumping is uncomfortable, make adjustments to your pump or try using different type of  pumps.  Drink enough fluid to keep your urine clear or pale yellow.  Wear clothing that opens in the front or allows easy access to your breasts.  Pump breast milk directly into clean bottles or other storage containers.  Do not use any products that contain nicotine or tobacco, such as cigarettes and e-cigarettes. These can lower your milk supply and harm your infant. If you need help quitting, ask your health care provider. Tips for storing breast milk  Store breast milk in a clean, BPA-free container, such as glass or plastic bottles or milk storage bags.  Store breast milk in 2-4 ounce batches to reduce waste.  Swirl the breast milk in the container to mix any cream that floats to the top. Do not shake it.  Label all stored milk with the date you pumped it.  The amount of time you can keep breast milk depends on where it is stored: ? Room temperature: 6-8 hours, if the milk is clean. It is best if used within 4 hours. ? Cooler with ice packs: 24 hours. ? Refrigerator: 5-8 days, if the milk is clean. It is best if used within 3 days. ? Freezer: 9-12 months, if the milk is clean and stored away from the freezer door. It is best if used within 6 months.  When using a refrigerator or freezer,  put the milk in the back to keep it as cold as possible.  Thaw frozen milk using warm water. Do not use the microwave. Tips for choosing a breast pump The right pump for you will depend on your comfort and how often you will be away from your baby. When choosing a pump, consider the following:  Manual breast pumps do not need electricity to work. They are usually cheaper than electric pumps, but they can be harder to use. They may be a good choice if you are occasionally away from your baby.  Electric breast pumps are usually more expensive than manual pumps, but they can be easier for some women to use. They can also collect more milk than manual pumps. This makes them a good choice for women  who work in an office or need to be away from their baby for longer periods of time.  The suction cup (flange) should be the right size. If it is the wrong size, it may cause pain and nipple damage.  Before buying a pump, find out whether your insurance covers the cost of a breast pump. Tips for maintaining a breast pump  Check your pump's manual for cleaning tips.  Clean the pump after each use. To do this: ? Wipe down the electrical unit. Use a dry, soft cloth or clean paper towel. Do not put the electrical unit in water or cleaning products. ? Wash the plastic pump parts with soap and warm water or in the dishwasher, if the parts are dishwasher safe. You do not need to clean the tubing unless it comes in contact with breast milk. Let the parts air dry. Avoid drying them with a cloth or towel. ? When the pump parts are clean and dry, put the pump back together. Then store the pump.  If there is water in the tubing when it comes time to pump, attach the tubing to the pump and turn on the pump. Run the pump until the tube is dry.  Avoid touching the inside of pump parts that come in contact with breast milk. Summary  Pumping can help stimulate your milk supply after your baby is born. It can also help maintain your milk supply when you are away from your baby.  When you are away from your infant for several hours, pump for about 15 minutes every 2-3 hours. Pump both breasts at the same time, if you can.  Your health care provider or lactation consultant can help you decide which breast pump is right for you. The right pump for you depends on your comfort, work schedule, and how often you may be away from your baby. This information is not intended to replace advice given to you by your health care provider. Make sure you discuss any questions you have with your health care provider. Document Released: 07/10/2009 Document Revised: 10/09/2017 Document Reviewed: 02/25/2016 Elsevier Interactive  Patient Education  2019 [employer].   Breastfeeding and Cracked or Sore Nipples Breastfeeding can be challenging, especially during the first few weeks after childbirth. It is normal to have some tenderness in your nipples when you start to breastfeed your new baby, even if you have breastfed before. Cracked or sore nipples are often caused by a poor latch, which refers to the way your baby's mouth attaches to your nipple to breastfeed. Soreness can also happen if your baby is not positioned properly at your breast. How does this affect me? Cracked or sore nipples can be painful. If  breastfeeding is too painful or your baby has a poor latch, you may have problems completely emptying your breasts during a feeding. This may lead to problems such as overfilling of your breasts with milk (engorgement), infection, or a decrease in milk supply. How does this affect my baby? If a poor latch is the cause of cracked or sore nipples, your baby may also have problems getting enough breast milk during a feeding. Follow these instructions at home: Breastfeeding strategy   Make sure your baby is latched on to your breast and positioned correctly.  Try different breastfeeding positions to find one that works the best.  Start breastfeeding by having your baby feed from the less sore breast first.  Make sure you break your baby's latch before removing him or her from the breast. To do this: ? Insert your little finger between your nipple and your baby's gums. If you use a breast pump:  Make sure the flange fits properly over your nipple. A poor-fitting flange can cause nipple damage.  Start by setting the pump to a low setting, and gradually increase the pump strength as needed. Too high of a setting may cause damage to your nipple. Breast care Ensure that your breasts stay moisturized and healthy by:  Avoiding the use of soap on your nipples.  Wearing a supportive bra. Avoid wearing  underwire-style bras or tight bras.  Air drying your nipples for 3-4 minutes after each feeding.  Using only cotton bra pads to absorb any breast milk that leaks. Be sure to change the pads if they become soaked with milk.  Using lanolin on your nipples after nursing. If you use pure lanolin, you do not need to wash it off before feeding your baby again. Pure lanolin is not poisonous (toxic) to your baby.  Massaging some breast milk into your nipples: ? Use your hand to squeeze out a few drops of breast milk (hand express). ? Gently massage the milk into your nipple. ? Let your nipple air-dry. Contact a health care provider if:  You have nipple pain.  You have soreness or cracking that lasts more than 1 week. Summary  It is normal to have some tenderness in your nipples when you start to breastfeed your new baby. However, you should contact your health care provider if you have nipple pain.  Cracked or sore nipples are most often caused by a poor latch or improper positioning.  To help with cracked or sore nipples, make sure your baby is latched on to your breast and positioned correctly, and keep your nipples healthy and moisturized. Use lanolin or expressed milk on your nipples. Avoid washing your nipples with soap. This information is not intended to replace advice given to you by your health care provider. Make sure you discuss any questions you have with your health care provider. Document Released: 08/27/2016 Document Revised: 08/27/2016 Document Reviewed: 08/27/2016 Elsevier Interactive Patient Education  2019 [employer].   Breastfeeding and Low Milk Supply It is normal to have some problems when you start to breastfeed your new baby. One problem is having a low amount of breast milk. If you have a low milk supply, this may cause your baby to not gain enough weight. Making sure your breasts are emptied during feedings can help prevent a low milk supply. Follow these  instructions at home: When to breastfeed your baby  Breastfeed when you feel like you need to reduce the fullness of your breasts or when your baby shows  signs of hunger. This is called "breastfeeding on demand."  Do not delay feedings. Feed your baby often. General instructions   Try to empty your breasts of milk at each feeding. This will cause them to make more milk.  If your breast is not empty after a feeding, use a pump or squeeze with your hand (hand express) to get the rest of the milk out.  Make sure your baby's mouth attaches to your nipple (latches) properly when breastfeeding.  Make sure your baby is in the right position when breastfeeding. Try different positions to find one that helps your baby feed better.  Do not give your baby extra formula unless your doctor or breastfeeding specialist (lactation consultant) tells you to do that. Medicines  Let your doctor know what over-the-counter or prescription medicines you are taking. Some medicines may affect how much milk you make.  Talk to your doctor or breastfeeding specialist before you take any herbal supplements. Contact a doctor if:  Your baby does not gain weight.  Your baby loses weight.  You continue to have a low milk supply. Summary  If you have a low milk supply, this may cause your baby to not gain enough weight.  Feed your baby often. Do not delay feedings.  Try to empty your breasts of milk at each feeding. Use a pump or squeeze with your hand (hand express) to get remaining milk out after a feeding. This information is not intended to replace advice given to you by your health care provider. Make sure you discuss any questions you have with your health care provider. Document Released: 08/27/2016 Document Revised: 08/27/2016 Document Reviewed: 08/27/2016 Elsevier Interactive Patient Education  2019 [employer]

## 2018-06-21 DIAGNOSIS — R633 Feeding difficulties: Secondary | ICD-10-CM | POA: Diagnosis not present

## 2018-06-23 ENCOUNTER — Encounter: Payer: Medicaid Other | Admitting: Obstetrics and Gynecology

## 2018-06-24 DIAGNOSIS — R633 Feeding difficulties: Secondary | ICD-10-CM | POA: Diagnosis not present

## 2018-06-29 ENCOUNTER — Ambulatory Visit (HOSPITAL_COMMUNITY): Payer: Medicaid Other

## 2018-06-29 ENCOUNTER — Encounter (HOSPITAL_COMMUNITY): Payer: Self-pay

## 2018-07-01 ENCOUNTER — Telehealth: Payer: Self-pay | Admitting: Obstetrics

## 2018-07-01 DIAGNOSIS — Z00121 Encounter for routine child health examination with abnormal findings: Secondary | ICD-10-CM | POA: Diagnosis not present

## 2018-07-01 DIAGNOSIS — K219 Gastro-esophageal reflux disease without esophagitis: Secondary | ICD-10-CM | POA: Diagnosis not present

## 2018-07-06 DIAGNOSIS — H04559 Acquired stenosis of unspecified nasolacrimal duct: Secondary | ICD-10-CM | POA: Diagnosis not present

## 2018-07-09 ENCOUNTER — Ambulatory Visit (INDEPENDENT_AMBULATORY_CARE_PROVIDER_SITE_OTHER): Payer: BC Managed Care – PPO

## 2018-07-09 ENCOUNTER — Other Ambulatory Visit: Payer: Self-pay

## 2018-07-09 VITALS — BP 131/88 | HR 74 | Ht 61.0 in | Wt 252.6 lb

## 2018-07-09 DIAGNOSIS — Z0130 Encounter for examination of blood pressure without abnormal findings: Secondary | ICD-10-CM

## 2018-07-09 NOTE — Progress Notes (Addendum)
Subjective:  Victoria Robinson is a 33 y.o. female here for BP check.   Hypertension ROS: taking medications as instructed, no medication side effects noted, no TIA's, no chest pain on exertion, no dyspnea on exertion and no swelling of ankles.    Objective:  BP 131/88   Pulse 74   Ht 5\' 1"  (1.549 m)   Wt 252 lb 9.6 oz (114.6 kg)   Breastfeeding Yes   BMI 47.73 kg/m   Appearance alert, well appearing, and in no distress. General exam BP noted to be well controlled today in office.    Assessment:   Blood Pressure well controlled.   Plan:  Current treatment plan is effective, no change in therapy. Keep PP Appt. Per Dr. Clearance Coots..   I have reviewed the chart and agree with nursing staff's documentation of this patient's encounter.  Coral Ceo, MD 07/09/2018 11:32 AM

## 2018-07-16 ENCOUNTER — Ambulatory Visit (INDEPENDENT_AMBULATORY_CARE_PROVIDER_SITE_OTHER): Payer: BC Managed Care – PPO | Admitting: Obstetrics & Gynecology

## 2018-07-16 ENCOUNTER — Encounter: Payer: Self-pay | Admitting: Obstetrics & Gynecology

## 2018-07-16 ENCOUNTER — Other Ambulatory Visit: Payer: Self-pay

## 2018-07-16 VITALS — BP 128/87 | HR 85 | Ht 61.0 in | Wt 255.0 lb

## 2018-07-16 DIAGNOSIS — Z30011 Encounter for initial prescription of contraceptive pills: Secondary | ICD-10-CM

## 2018-07-16 MED ORDER — NORETHINDRONE 0.35 MG PO TABS: 1.0000 | ORAL_TABLET | Freq: Every day | ORAL | 11 refills | Status: DC

## 2018-07-16 NOTE — Patient Instructions (Signed)

## 2018-07-16 NOTE — Progress Notes (Signed)
4 weeks PP. She is undecided about BC method OCP/BTL.  C/o pain at epidural site.    Subjective:     Victoria Robinson is a 33 y.o. female who presents for a postpartum visit. She is 4 weeks postpartum following a spontaneous vaginal delivery. I have fully reviewed the prenatal and intrapartum course. The delivery was at 37.1 gestational weeks. Outcome: spontaneous vaginal delivery. Anesthesia: epidural. Postpartum course has been nremarkable. Baby's course has been Unremarkable. Baby is feeding by breast. Bleeding thin lochia. Bowel function is constipated. Bladder function is normal. Patient is not sexually active. Contraception method is none. Postpartum depression screening: negative.= 1  The following portions of the patient's history were reviewed and updated as appropriate: allergies, current medications, past family history, past medical history, past social history, past surgical history and problem list.  Review of Systems Pertinent items are noted in HPI.   Objective:    BP 128/87   Pulse 85   Ht 5\' 1"  (1.549 m)   Wt 255 lb (115.7 kg)   Breastfeeding Yes   BMI 48.18 kg/m   General:  alert, cooperative and no distress      back No swelling non-tender     Abdomen: soft, non-tender; bowel sounds normal; no masses,  no organomegaly   Vulva:  not evaluated  Vagina: not evaluated                    Assessment:     normal postpartum exam. Pap smear not done at today's visit.   Plan:    1. Contraception: oral progesterone-only contraceptive 2. Abstain after starting OCP for one week 3. Follow up as needed.    Woodroe Mode, MD 07/16/2018

## 2018-07-20 ENCOUNTER — Telehealth: Payer: Self-pay

## 2018-07-20 NOTE — Telephone Encounter (Addendum)
TC from pt requests a letterhead to send to [facility] [employer] with dates of hospital admission and return to work date.

## 2018-08-13 ENCOUNTER — Other Ambulatory Visit: Payer: Self-pay | Admitting: Obstetrics and Gynecology

## 2018-09-09 ENCOUNTER — Encounter: Payer: Self-pay | Admitting: Podiatry

## 2018-09-09 ENCOUNTER — Other Ambulatory Visit: Payer: Self-pay

## 2018-09-09 ENCOUNTER — Ambulatory Visit: Payer: Medicaid Other | Admitting: Podiatry

## 2018-09-09 ENCOUNTER — Ambulatory Visit: Payer: Medicaid Other

## 2018-09-09 ENCOUNTER — Ambulatory Visit (INDEPENDENT_AMBULATORY_CARE_PROVIDER_SITE_OTHER): Payer: Medicaid Other

## 2018-09-09 VITALS — Temp 97.6°F

## 2018-09-09 DIAGNOSIS — M722 Plantar fascial fibromatosis: Secondary | ICD-10-CM | POA: Diagnosis not present

## 2018-09-09 MED ORDER — DICLOFENAC SODIUM 75 MG PO TBEC: 75.0000 mg | DELAYED_RELEASE_TABLET | Freq: Two times a day (BID) | ORAL | 2 refills | Status: DC

## 2018-09-09 NOTE — Progress Notes (Signed)
Subjective:   Patient ID: Victoria Robinson, female   DOB: 33 y.o.   MRN: [id number]   HPI Patient states that heels have really started to hurt her and make it hard for her to be active   ROS      Objective:  Physical Exam  Neurovascular status intact with exquisite discomfort plantar heel region bilateral      Assessment:  Acute plantar fasciitis bilateral     Plan:  H&P sterile preps applied and injected the plantar fascial bilateral 3 mg Kenalog 5 mg Xylocaine and placed on diclofenac 75 mg twice daily.  Reappoint to recheck his symptoms indicate  X-rays indicate there is moderate depression of the arch with spur formation bilateral heel

## 2018-09-09 NOTE — Patient Instructions (Signed)

## 2018-11-09 ENCOUNTER — Other Ambulatory Visit: Payer: Self-pay

## 2018-11-09 DIAGNOSIS — Z20822 Contact with and (suspected) exposure to covid-19: Secondary | ICD-10-CM

## 2018-11-11 LAB — SPECIMEN STATUS REPORT

## 2018-11-11 LAB — NOVEL CORONAVIRUS, NAA: SARS-CoV-2, NAA: NOT DETECTED

## 2019-03-29 ENCOUNTER — Other Ambulatory Visit (HOSPITAL_COMMUNITY)
Admission: RE | Admit: 2019-03-29 | Discharge: 2019-03-29 | Disposition: A | Discharge status: Home / Self Care | Payer: BC Managed Care – PPO | Source: Ambulatory Visit | Attending: Obstetrics and Gynecology | Admitting: Obstetrics and Gynecology

## 2019-03-29 ENCOUNTER — Ambulatory Visit (INDEPENDENT_AMBULATORY_CARE_PROVIDER_SITE_OTHER): Payer: Medicaid Other | Admitting: Obstetrics and Gynecology

## 2019-03-29 ENCOUNTER — Encounter: Payer: Self-pay | Admitting: Obstetrics and Gynecology

## 2019-03-29 ENCOUNTER — Other Ambulatory Visit: Payer: Self-pay

## 2019-03-29 DIAGNOSIS — O09891 Supervision of other high risk pregnancies, first trimester: Secondary | ICD-10-CM

## 2019-03-29 DIAGNOSIS — Z8619 Personal history of other infectious and parasitic diseases: Secondary | ICD-10-CM

## 2019-03-29 DIAGNOSIS — Z3481 Encounter for supervision of other normal pregnancy, first trimester: Secondary | ICD-10-CM | POA: Diagnosis not present

## 2019-03-29 DIAGNOSIS — Z3A12 12 weeks gestation of pregnancy: Secondary | ICD-10-CM

## 2019-03-29 DIAGNOSIS — Z8759 Personal history of other complications of pregnancy, childbirth and the puerperium: Secondary | ICD-10-CM

## 2019-03-29 DIAGNOSIS — Z3480 Encounter for supervision of other normal pregnancy, unspecified trimester: Secondary | ICD-10-CM

## 2019-03-29 DIAGNOSIS — O10919 Unspecified pre-existing hypertension complicating pregnancy, unspecified trimester: Secondary | ICD-10-CM

## 2019-03-29 DIAGNOSIS — O09899 Supervision of other high risk pregnancies, unspecified trimester: Secondary | ICD-10-CM | POA: Insufficient documentation

## 2019-03-29 DIAGNOSIS — O10911 Unspecified pre-existing hypertension complicating pregnancy, first trimester: Secondary | ICD-10-CM

## 2019-03-29 HISTORY — DX: Encounter for supervision of other normal pregnancy, unspecified trimester: Z34.80

## 2019-03-29 MED ORDER — LABETALOL HCL 200 MG PO TABS: 200.0000 mg | ORAL_TABLET | Freq: Two times a day (BID) | ORAL | 3 refills | Status: DC

## 2019-03-29 MED ORDER — ASPIRIN EC 81 MG PO TBEC: 81.0000 mg | DELAYED_RELEASE_TABLET | Freq: Every day | ORAL | 2 refills | Status: DC

## 2019-03-29 NOTE — Progress Notes (Signed)
Headaches for about 1 week took amlodipine 5 and it helped 138/92 102

## 2019-03-29 NOTE — Patient Instructions (Signed)
 First Trimester of Pregnancy The first trimester of pregnancy is from week 1 until the end of week 13 (months 1 through 3). A week after a sperm fertilizes an egg, the egg will implant on the wall of the uterus. This embryo will begin to develop into a baby. Genes from you and your partner will form the baby. The female genes will determine whether the baby will be a boy or a girl. At 6-8 weeks, the eyes and face will be formed, and the heartbeat can be seen on ultrasound. At the end of 12 weeks, all the baby's organs will be formed. Now that you are pregnant, you will want to do everything you can to have a healthy baby. Two of the most important things are to get good prenatal care and to follow your health care provider's instructions. Prenatal care is all the medical care you receive before the baby's birth. This care will help prevent, find, and treat any problems during the pregnancy and childbirth. Body changes during your first trimester Your body goes through many changes during pregnancy. The changes vary from woman to woman.  You may gain or lose a couple of pounds at first.  You may feel sick to your stomach (nauseous) and you may throw up (vomit). If the vomiting is uncontrollable, call your health care provider.  You may tire easily.  You may develop headaches that can be relieved by medicines. All medicines should be approved by your health care provider.  You may urinate more often. Painful urination may mean you have a bladder infection.  You may develop heartburn as a result of your pregnancy.  You may develop constipation because certain hormones are causing the muscles that push stool through your intestines to slow down.  You may develop hemorrhoids or swollen veins (varicose veins).  Your breasts may begin to grow larger and become tender. Your nipples may stick out more, and the tissue that surrounds them (areola) may become darker.  Your gums may bleed and may be  sensitive to brushing and flossing.  Dark spots or blotches (chloasma, mask of pregnancy) may develop on your face. This will likely fade after the baby is born.  Your menstrual periods will stop.  You may have a loss of appetite.  You may develop cravings for certain kinds of food.  You may have changes in your emotions from day to day, such as being excited to be pregnant or being concerned that something may go wrong with the pregnancy and baby.  You may have more vivid and strange dreams.  You may have changes in your hair. These can include thickening of your hair, rapid growth, and changes in texture. Some women also have hair loss during or after pregnancy, or hair that feels dry or thin. Your hair will most likely return to normal after your baby is born. What to expect at prenatal visits During a routine prenatal visit:  You will be weighed to make sure you and the baby are growing normally.  Your blood pressure will be taken.  Your abdomen will be measured to track your baby's growth.  The fetal heartbeat will be listened to between weeks 10 and 14 of your pregnancy.  Test results from any previous visits will be discussed. Your health care provider may ask you:  How you are feeling.  If you are feeling the baby move.  If you have had any abnormal symptoms, such as leaking fluid, bleeding, severe headaches, or   abdominal cramping.  If you are using any tobacco products, including cigarettes, chewing tobacco, and electronic cigarettes.  If you have any questions. Other tests that may be performed during your first trimester include:  Blood tests to find your blood type and to check for the presence of any previous infections. The tests will also be used to check for low iron levels (anemia) and protein on red blood cells (Rh antibodies). Depending on your risk factors, or if you previously had diabetes during pregnancy, you may have tests to check for high blood sugar  that affects pregnant women (gestational diabetes).  Urine tests to check for infections, diabetes, or protein in the urine.  An ultrasound to confirm the proper growth and development of the baby.  Fetal screens for spinal cord problems (spina bifida) and Down syndrome.  HIV (human immunodeficiency virus) testing. Routine prenatal testing includes screening for HIV, unless you choose not to have this test.  You may need other tests to make sure you and the baby are doing well. Follow these instructions at home: Medicines  Follow your health care provider's instructions regarding medicine use. Specific medicines may be either safe or unsafe to take during pregnancy.  Take a prenatal vitamin that contains at least 600 micrograms (mcg) of folic acid.  If you develop constipation, try taking a stool softener if your health care provider approves. Eating and drinking   Eat a balanced diet that includes fresh fruits and vegetables, whole grains, good sources of protein such as meat, eggs, or tofu, and low-fat dairy. Your health care provider will help you determine the amount of weight gain that is right for you.  Avoid raw meat and uncooked cheese. These carry germs that can cause birth defects in the baby.  Eating four or five small meals rather than three large meals a day may help relieve nausea and vomiting. If you start to feel nauseous, eating a few soda crackers can be helpful. Drinking liquids between meals, instead of during meals, also seems to help ease nausea and vomiting.  Limit foods that are high in fat and processed sugars, such as fried and sweet foods.  To prevent constipation: ? Eat foods that are high in fiber, such as fresh fruits and vegetables, whole grains, and beans. ? Drink enough fluid to keep your urine clear or pale yellow. Activity  Exercise only as directed by your health care provider. Most women can continue their usual exercise routine during  pregnancy. Try to exercise for 30 minutes at least 5 days a week. Exercising will help you: ? Control your weight. ? Stay in shape. ? Be prepared for labor and delivery.  Experiencing pain or cramping in the lower abdomen or lower back is a good sign that you should stop exercising. Check with your health care provider before continuing with normal exercises.  Try to avoid standing for long periods of time. Move your legs often if you must stand in one place for a long time.  Avoid heavy lifting.  Wear low-heeled shoes and practice good posture.  You may continue to have sex unless your health care provider tells you not to. Relieving pain and discomfort  Wear a good support bra to relieve breast tenderness.  Take warm sitz baths to soothe any pain or discomfort caused by hemorrhoids. Use hemorrhoid cream if your health care provider approves.  Rest with your legs elevated if you have leg cramps or low back pain.  If you develop varicose veins   in your legs, wear support hose. Elevate your feet for 15 minutes, 3-4 times a day. Limit salt in your diet. Prenatal care  Schedule your prenatal visits by the twelfth week of pregnancy. They are usually scheduled monthly at first, then more often in the last 2 months before delivery.  Write down your questions. Take them to your prenatal visits.  Keep all your prenatal visits as told by your health care provider. This is important. Safety  Wear your seat belt at all times when driving.  Make a list of emergency phone numbers, including numbers for family, friends, the hospital, and police and fire departments. General instructions  Ask your health care provider for a referral to a local prenatal education class. Begin classes no later than the beginning of month 6 of your pregnancy.  Ask for help if you have counseling or nutritional needs during pregnancy. Your health care provider can offer advice or refer you to specialists for help  with various needs.  Do not use hot tubs, steam rooms, or saunas.  Do not douche or use tampons or scented sanitary pads.  Do not cross your legs for long periods of time.  Avoid cat litter boxes and soil used by cats. These carry germs that can cause birth defects in the baby and possibly loss of the fetus by miscarriage or stillbirth.  Avoid all smoking, herbs, alcohol, and medicines not prescribed by your health care provider. Chemicals in these products affect the formation and growth of the baby.  Do not use any products that contain nicotine or tobacco, such as cigarettes and e-cigarettes. If you need help quitting, ask your health care provider. You may receive counseling support and other resources to help you quit.  Schedule a dentist appointment. At home, brush your teeth with a soft toothbrush and be gentle when you floss. Contact a health care provider if:  You have dizziness.  You have mild pelvic cramps, pelvic pressure, or nagging pain in the abdominal area.  You have persistent nausea, vomiting, or diarrhea.  You have a bad smelling vaginal discharge.  You have pain when you urinate.  You notice increased swelling in your face, hands, legs, or ankles.  You are exposed to fifth disease or chickenpox.  You are exposed to German measles (rubella) and have never had it. Get help right away if:  You have a fever.  You are leaking fluid from your vagina.  You have spotting or bleeding from your vagina.  You have severe abdominal cramping or pain.  You have rapid weight gain or loss.  You vomit blood or material that looks like coffee grounds.  You develop a severe headache.  You have shortness of breath.  You have any kind of trauma, such as from a fall or a car accident. Summary  The first trimester of pregnancy is from week 1 until the end of week 13 (months 1 through 3).  Your body goes through many changes during pregnancy. The changes vary from  woman to woman.  You will have routine prenatal visits. During those visits, your health care provider will examine you, discuss any test results you may have, and talk with you about how you are feeling. This information is not intended to replace advice given to you by your health care provider. Make sure you discuss any questions you have with your health care provider. Document Revised: 01/02/2017 Document Reviewed: 01/02/2016 [employer] Patient Education  2020 [employer]   Second Trimester of   Pregnancy The second trimester is from week 14 through week 27 (months 4 through 6). The second trimester is often a time when you feel your best. Your body has adjusted to being pregnant, and you begin to feel better physically. Usually, morning sickness has lessened or quit completely, you may have more energy, and you may have an increase in appetite. The second trimester is also a time when the fetus is growing rapidly. At the end of the sixth month, the fetus is about 9 inches long and weighs about 1 pounds. You will likely begin to feel the baby move (quickening) between 16 and 20 weeks of pregnancy. Body changes during your second trimester Your body continues to go through many changes during your second trimester. The changes vary from woman to woman.  Your weight will continue to increase. You will notice your lower abdomen bulging out.  You may begin to get stretch marks on your hips, abdomen, and breasts.  You may develop headaches that can be relieved by medicines. The medicines should be approved by your health care provider.  You may urinate more often because the fetus is pressing on your bladder.  You may develop or continue to have heartburn as a result of your pregnancy.  You may develop constipation because certain hormones are causing the muscles that push waste through your intestines to slow down.  You may develop hemorrhoids or swollen, bulging veins (varicose  veins).  You may have back pain. This is caused by: ? Weight gain. ? Pregnancy hormones that are relaxing the joints in your pelvis. ? A shift in weight and the muscles that support your balance.  Your breasts will continue to grow and they will continue to become tender.  Your gums may bleed and may be sensitive to brushing and flossing.  Dark spots or blotches (chloasma, mask of pregnancy) may develop on your face. This will likely fade after the baby is born.  A dark line from your belly button to the pubic area (linea nigra) may appear. This will likely fade after the baby is born.  You may have changes in your hair. These can include thickening of your hair, rapid growth, and changes in texture. Some women also have hair loss during or after pregnancy, or hair that feels dry or thin. Your hair will most likely return to normal after your baby is born. What to expect at prenatal visits During a routine prenatal visit:  You will be weighed to make sure you and the fetus are growing normally.  Your blood pressure will be taken.  Your abdomen will be measured to track your baby's growth.  The fetal heartbeat will be listened to.  Any test results from the previous visit will be discussed. Your health care provider may ask you:  How you are feeling.  If you are feeling the baby move.  If you have had any abnormal symptoms, such as leaking fluid, bleeding, severe headaches, or abdominal cramping.  If you are using any tobacco products, including cigarettes, chewing tobacco, and electronic cigarettes.  If you have any questions. Other tests that may be performed during your second trimester include:  Blood tests that check for: ? Low iron levels (anemia). ? High blood sugar that affects pregnant women (gestational diabetes) between 24 and 28 weeks. ? Rh antibodies. This is to check for a protein on red blood cells (Rh factor).  Urine tests to check for infections,  diabetes, or protein in the urine.    An ultrasound to confirm the proper growth and development of the baby.  An amniocentesis to check for possible genetic problems.  Fetal screens for spina bifida and Down syndrome.  HIV (human immunodeficiency virus) testing. Routine prenatal testing includes screening for HIV, unless you choose not to have this test. Follow these instructions at home: Medicines  Follow your health care provider's instructions regarding medicine use. Specific medicines may be either safe or unsafe to take during pregnancy.  Take a prenatal vitamin that contains at least 600 micrograms (mcg) of folic acid.  If you develop constipation, try taking a stool softener if your health care provider approves. Eating and drinking   Eat a balanced diet that includes fresh fruits and vegetables, whole grains, good sources of protein such as meat, eggs, or tofu, and low-fat dairy. Your health care provider will help you determine the amount of weight gain that is right for you.  Avoid raw meat and uncooked cheese. These carry germs that can cause birth defects in the baby.  If you have low calcium intake from food, talk to your health care provider about whether you should take a daily calcium supplement.  Limit foods that are high in fat and processed sugars, such as fried and sweet foods.  To prevent constipation: ? Drink enough fluid to keep your urine clear or pale yellow. ? Eat foods that are high in fiber, such as fresh fruits and vegetables, whole grains, and beans. Activity  Exercise only as directed by your health care provider. Most women can continue their usual exercise routine during pregnancy. Try to exercise for 30 minutes at least 5 days a week. Stop exercising if you experience uterine contractions.  Avoid heavy lifting, wear low heel shoes, and practice good posture.  A sexual relationship may be continued unless your health care provider directs you  otherwise. Relieving pain and discomfort  Wear a good support bra to prevent discomfort from breast tenderness.  Take warm sitz baths to soothe any pain or discomfort caused by hemorrhoids. Use hemorrhoid cream if your health care provider approves.  Rest with your legs elevated if you have leg cramps or low back pain.  If you develop varicose veins, wear support hose. Elevate your feet for 15 minutes, 3-4 times a day. Limit salt in your diet. Prenatal Care  Write down your questions. Take them to your prenatal visits.  Keep all your prenatal visits as told by your health care provider. This is important. Safety  Wear your seat belt at all times when driving.  Make a list of emergency phone numbers, including numbers for family, friends, the hospital, and police and fire departments. General instructions  Ask your health care provider for a referral to a local prenatal education class. Begin classes no later than the beginning of month 6 of your pregnancy.  Ask for help if you have counseling or nutritional needs during pregnancy. Your health care provider can offer advice or refer you to specialists for help with various needs.  Do not use hot tubs, steam rooms, or saunas.  Do not douche or use tampons or scented sanitary pads.  Do not cross your legs for long periods of time.  Avoid cat litter boxes and soil used by cats. These carry germs that can cause birth defects in the baby and possibly loss of the fetus by miscarriage or stillbirth.  Avoid all smoking, herbs, alcohol, and unprescribed drugs. Chemicals in these products can affect the formation and growth of   the baby.  Do not use any products that contain nicotine or tobacco, such as cigarettes and e-cigarettes. If you need help quitting, ask your health care provider.  Visit your dentist if you have not gone yet during your pregnancy. Use a soft toothbrush to brush your teeth and be gentle when you floss. Contact a  health care provider if:  You have dizziness.  You have mild pelvic cramps, pelvic pressure, or nagging pain in the abdominal area.  You have persistent nausea, vomiting, or diarrhea.  You have a bad smelling vaginal discharge.  You have pain when you urinate. Get help right away if:  You have a fever.  You are leaking fluid from your vagina.  You have spotting or bleeding from your vagina.  You have severe abdominal cramping or pain.  You have rapid weight gain or weight loss.  You have shortness of breath with chest pain.  You notice sudden or extreme swelling of your face, hands, ankles, feet, or legs.  You have not felt your baby move in over an hour.  You have severe headaches that do not go away when you take medicine.  You have vision changes. Summary  The second trimester is from week 14 through week 27 (months 4 through 6). It is also a time when the fetus is growing rapidly.  Your body goes through many changes during pregnancy. The changes vary from woman to woman.  Avoid all smoking, herbs, alcohol, and unprescribed drugs. These chemicals affect the formation and growth your baby.  Do not use any tobacco products, such as cigarettes, chewing tobacco, and e-cigarettes. If you need help quitting, ask your health care provider.  Contact your health care provider if you have any questions. Keep all prenatal visits as told by your health care provider. This is important. This information is not intended to replace advice given to you by your health care provider. Make sure you discuss any questions you have with your health care provider. Document Revised: 05/14/2018 Document Reviewed: 02/26/2016 [employer] Patient Education  2020 [employer]   Contraception Choices Contraception, also called birth control, refers to methods or devices that prevent pregnancy. Hormonal methods Contraceptive implant  A contraceptive implant is a thin, plastic tube that  contains a hormone. It is inserted into the upper part of the arm. It can remain in place for up to 3 years. Progestin-only injections Progestin-only injections are injections of progestin, a synthetic form of the hormone progesterone. They are given every 3 months by a health care provider. Birth control pills  Birth control pills are pills that contain hormones that prevent pregnancy. They must be taken once a day, preferably at the same time each day. Birth control patch  The birth control patch contains hormones that prevent pregnancy. It is placed on the skin and must be changed once a week for three weeks and removed on the fourth week. A prescription is needed to use this method of contraception. Vaginal ring  A vaginal ring contains hormones that prevent pregnancy. It is placed in the vagina for three weeks and removed on the fourth week. After that, the process is repeated with a new ring. A prescription is needed to use this method of contraception. Emergency contraceptive Emergency contraceptives prevent pregnancy after unprotected sex. They come in pill form and can be taken up to 5 days after sex. They work best the sooner they are taken after having sex. Most emergency contraceptives are available without a prescription. This   method should not be used as your only form of birth control. Barrier methods Female condom  A female condom is a thin sheath that is worn over the penis during sex. Condoms keep sperm from going inside a woman's body. They can be used with a spermicide to increase their effectiveness. They should be disposed after a single use. Female condom  A female condom is a soft, loose-fitting sheath that is put into the vagina before sex. The condom keeps sperm from going inside a woman's body. They should be disposed after a single use. Diaphragm  A diaphragm is a soft, dome-shaped barrier. It is inserted into the vagina before sex, along with a spermicide. The  diaphragm blocks sperm from entering the uterus, and the spermicide kills sperm. A diaphragm should be left in the vagina for 6-8 hours after sex and removed within 24 hours. A diaphragm is prescribed and fitted by a health care provider. A diaphragm should be replaced every 1-2 years, after giving birth, after gaining more than 15 lb (6.8 kg), and after pelvic surgery. Cervical cap  A cervical cap is a round, soft latex or plastic cup that fits over the cervix. It is inserted into the vagina before sex, along with spermicide. It blocks sperm from entering the uterus. The cap should be left in place for 6-8 hours after sex and removed within 48 hours. A cervical cap must be prescribed and fitted by a health care provider. It should be replaced every 2 years. Sponge  A sponge is a soft, circular piece of polyurethane foam with spermicide on it. The sponge helps block sperm from entering the uterus, and the spermicide kills sperm. To use it, you make it wet and then insert it into the vagina. It should be inserted before sex, left in for at least 6 hours after sex, and removed and thrown away within 30 hours. Spermicides Spermicides are chemicals that kill or block sperm from entering the cervix and uterus. They can come as a cream, jelly, suppository, foam, or tablet. A spermicide should be inserted into the vagina with an applicator at least 10-15 minutes before sex to allow time for it to work. The process must be repeated every time you have sex. Spermicides do not require a prescription. Intrauterine contraception Intrauterine device (IUD) An IUD is a T-shaped device that is put in a woman's uterus. There are two types:  Hormone IUD.This type contains progestin, a synthetic form of the hormone progesterone. This type can stay in place for 3-5 years.  Copper IUD.This type is wrapped in copper wire. It can stay in place for 10 years.  Permanent methods of contraception Female tubal ligation In  this method, a woman's fallopian tubes are sealed, tied, or blocked during surgery to prevent eggs from traveling to the uterus. Hysteroscopic sterilization In this method, a small, flexible insert is placed into each fallopian tube. The inserts cause scar tissue to form in the fallopian tubes and block them, so sperm cannot reach an egg. The procedure takes about 3 months to be effective. Another form of birth control must be used during those 3 months. Female sterilization This is a procedure to tie off the tubes that carry sperm (vasectomy). After the procedure, the man can still ejaculate fluid (semen). Natural planning methods Natural family planning In this method, a couple does not have sex on days when the woman could become pregnant. Calendar method This means keeping track of the length of each menstrual cycle,   identifying the days when pregnancy can happen, and not having sex on those days. Ovulation method In this method, a couple avoids sex during ovulation. Symptothermal method This method involves not having sex during ovulation. The woman typically checks for ovulation by watching changes in her temperature and in the consistency of cervical mucus. Post-ovulation method In this method, a couple waits to have sex until after ovulation. Summary  Contraception, also called birth control, means methods or devices that prevent pregnancy.  Hormonal methods of contraception include implants, injections, pills, patches, vaginal rings, and emergency contraceptives.  Barrier methods of contraception can include female condoms, female condoms, diaphragms, cervical caps, sponges, and spermicides.  There are two types of IUDs (intrauterine devices). An IUD can be put in a woman's uterus to prevent pregnancy for 3-5 years.  Permanent sterilization can be done through a procedure for males, females, or both.  Natural family planning methods involve not having sex on days when the woman could  become pregnant. This information is not intended to replace advice given to you by your health care provider. Make sure you discuss any questions you have with your health care provider. Document Revised: 01/22/2017 Document Reviewed: 02/23/2016 Elsevier Patient Education  2020 [employer]   Breastfeeding  Choosing to breastfeed is one of the best decisions you can make for yourself and your baby. A change in hormones during pregnancy causes your breasts to make breast milk in your milk-producing glands. Hormones prevent breast milk from being released before your baby is born. They also prompt milk flow after birth. Once breastfeeding has begun, thoughts of your baby, as well as his or her sucking or crying, can stimulate the release of milk from your milk-producing glands. Benefits of breastfeeding Research shows that breastfeeding offers many health benefits for infants and mothers. It also offers a cost-free and convenient way to feed your baby. For your baby  Your first milk (colostrum) helps your baby's digestive system to function better.  Special cells in your milk (antibodies) help your baby to fight off infections.  Breastfed babies are less likely to develop asthma, allergies, obesity, or type 2 diabetes. They are also at lower risk for sudden infant death syndrome (SIDS).  Nutrients in breast milk are better able to meet your baby's needs compared to infant formula.  Breast milk improves your baby's brain development. For you  Breastfeeding helps to create a very special bond between you and your baby.  Breastfeeding is convenient. Breast milk costs nothing and is always available at the correct temperature.  Breastfeeding helps to burn calories. It helps you to lose the weight that you gained during pregnancy.  Breastfeeding makes your uterus return faster to its size before pregnancy. It also slows bleeding (lochia) after you give birth.  Breastfeeding helps to lower  your risk of developing type 2 diabetes, osteoporosis, rheumatoid arthritis, cardiovascular disease, and breast, ovarian, uterine, and endometrial cancer later in life. Breastfeeding basics Starting breastfeeding  Find a comfortable place to sit or lie down, with your neck and back well-supported.  Place a pillow or a rolled-up blanket under your baby to bring him or her to the level of your breast (if you are seated). Nursing pillows are specially designed to help support your arms and your baby while you breastfeed.  Make sure that your baby's tummy (abdomen) is facing your abdomen.  Gently massage your breast. With your fingertips, massage from the outer edges of your breast inward toward the nipple. This encourages   milk flow. If your milk flows slowly, you may need to continue this action during the feeding.  Support your breast with 4 fingers underneath and your thumb above your nipple (make the letter "C" with your hand). Make sure your fingers are well away from your nipple and your baby's mouth.  Stroke your baby's lips gently with your finger or nipple.  When your baby's mouth is open wide enough, quickly bring your baby to your breast, placing your entire nipple and as much of the areola as possible into your baby's mouth. The areola is the colored area around your nipple. ? More areola should be visible above your baby's upper lip than below the lower lip. ? Your baby's lips should be opened and extended outward (flanged) to ensure an adequate, comfortable latch. ? Your baby's tongue should be between his or her lower gum and your breast.  Make sure that your baby's mouth is correctly positioned around your nipple (latched). Your baby's lips should create a seal on your breast and be turned out (everted).  It is common for your baby to suck about 2-3 minutes in order to start the flow of breast milk. Latching Teaching your baby how to latch onto your breast properly is very  important. An improper latch can cause nipple pain, decreased milk supply, and poor weight gain in your baby. Also, if your baby is not latched onto your nipple properly, he or she may swallow some air during feeding. This can make your baby fussy. Burping your baby when you switch breasts during the feeding can help to get rid of the air. However, teaching your baby to latch on properly is still the best way to prevent fussiness from swallowing air while breastfeeding. Signs that your baby has successfully latched onto your nipple  Silent tugging or silent sucking, without causing you pain. Infant's lips should be extended outward (flanged).  Swallowing heard between every 3-4 sucks once your milk has started to flow (after your let-down milk reflex occurs).  Muscle movement above and in front of his or her ears while sucking. Signs that your baby has not successfully latched onto your nipple  Sucking sounds or smacking sounds from your baby while breastfeeding.  Nipple pain. If you think your baby has not latched on correctly, slip your finger into the corner of your baby's mouth to break the suction and place it between your baby's gums. Attempt to start breastfeeding again. Signs of successful breastfeeding Signs from your baby  Your baby will gradually decrease the number of sucks or will completely stop sucking.  Your baby will fall asleep.  Your baby's body will relax.  Your baby will retain a small amount of milk in his or her mouth.  Your baby will let go of your breast by himself or herself. Signs from you  Breasts that have increased in firmness, weight, and size 1-3 hours after feeding.  Breasts that are softer immediately after breastfeeding.  Increased milk volume, as well as a change in milk consistency and color by the fifth day of breastfeeding.  Nipples that are not sore, cracked, or bleeding. Signs that your baby is getting enough milk  Wetting at least 1-2  diapers during the first 24 hours after birth.  Wetting at least 5-6 diapers every 24 hours for the first week after birth. The urine should be clear or pale yellow by the age of 5 days.  Wetting 6-8 diapers every 24 hours as your baby continues   to grow and develop.  At least 3 stools in a 24-hour period by the age of 5 days. The stool should be soft and yellow.  At least 3 stools in a 24-hour period by the age of 7 days. The stool should be seedy and yellow.  No loss of weight greater than 10% of birth weight during the first 3 days of life.  Average weight gain of 4-7 oz (113-198 g) per week after the age of 4 days.  Consistent daily weight gain by the age of 5 days, without weight loss after the age of 2 weeks. After a feeding, your baby may spit up a small amount of milk. This is normal. Breastfeeding frequency and duration Frequent feeding will help you make more milk and can prevent sore nipples and extremely full breasts (breast engorgement). Breastfeed when you feel the need to reduce the fullness of your breasts or when your baby shows signs of hunger. This is called "breastfeeding on demand." Signs that your baby is hungry include:  Increased alertness, activity, or restlessness.  Movement of the head from side to side.  Opening of the mouth when the corner of the mouth or cheek is stroked (rooting).  Increased sucking sounds, smacking lips, cooing, sighing, or squeaking.  Hand-to-mouth movements and sucking on fingers or hands.  Fussing or crying. Avoid introducing a pacifier to your baby in the first 4-6 weeks after your baby is born. After this time, you may choose to use a pacifier. Research has shown that pacifier use during the first year of a baby's life decreases the risk of sudden infant death syndrome (SIDS). Allow your baby to feed on each breast as long as he or she wants. When your baby unlatches or falls asleep while feeding from the first breast, offer the  second breast. Because newborns are often sleepy in the first few weeks of life, you may need to awaken your baby to get him or her to feed. Breastfeeding times will vary from baby to baby. However, the following rules can serve as a guide to help you make sure that your baby is properly fed:  Newborns (babies 4 weeks of age or younger) may breastfeed every 1-3 hours.  Newborns should not go without breastfeeding for longer than 3 hours during the day or 5 hours during the night.  You should breastfeed your baby a minimum of 8 times in a 24-hour period. Breast milk pumping     Pumping and storing breast milk allows you to make sure that your baby is exclusively fed your breast milk, even at times when you are unable to breastfeed. This is especially important if you go back to work while you are still breastfeeding, or if you are not able to be present during feedings. Your lactation consultant can help you find a method of pumping that works best for you and give you guidelines about how long it is safe to store breast milk. Caring for your breasts while you breastfeed Nipples can become dry, cracked, and sore while breastfeeding. The following recommendations can help keep your breasts moisturized and healthy:  Avoid using soap on your nipples.  Wear a supportive bra designed especially for nursing. Avoid wearing underwire-style bras or extremely tight bras (sports bras).  Air-dry your nipples for 3-4 minutes after each feeding.  Use only cotton bra pads to absorb leaked breast milk. Leaking of breast milk between feedings is normal.  Use lanolin on your nipples after breastfeeding. Lanolin helps   to maintain your skin's normal moisture barrier. Pure lanolin is not harmful (not toxic) to your baby. You may also hand express a few drops of breast milk and gently massage that milk into your nipples and allow the milk to air-dry. In the first few weeks after giving birth, some women experience  breast engorgement. Engorgement can make your breasts feel heavy, warm, and tender to the touch. Engorgement peaks within 3-5 days after you give birth. The following recommendations can help to ease engorgement:  Completely empty your breasts while breastfeeding or pumping. You may want to start by applying warm, moist heat (in the shower or with warm, water-soaked hand towels) just before feeding or pumping. This increases circulation and helps the milk flow. If your baby does not completely empty your breasts while breastfeeding, pump any extra milk after he or she is finished.  Apply ice packs to your breasts immediately after breastfeeding or pumping, unless this is too uncomfortable for you. To do this: ? Put ice in a plastic bag. ? Place a towel between your skin and the bag. ? Leave the ice on for 20 minutes, 2-3 times a day.  Make sure that your baby is latched on and positioned properly while breastfeeding. If engorgement persists after 48 hours of following these recommendations, contact your health care provider or a lactation consultant. Overall health care recommendations while breastfeeding  Eat 3 healthy meals and 3 snacks every day. Well-nourished mothers who are breastfeeding need an additional 450-500 calories a day. You can meet this requirement by increasing the amount of a balanced diet that you eat.  Drink enough water to keep your urine pale yellow or clear.  Rest often, relax, and continue to take your prenatal vitamins to prevent fatigue, stress, and low vitamin and mineral levels in your body (nutrient deficiencies).  Do not use any products that contain nicotine or tobacco, such as cigarettes and e-cigarettes. Your baby may be harmed by chemicals from cigarettes that pass into breast milk and exposure to secondhand smoke. If you need help quitting, ask your health care provider.  Avoid alcohol.  Do not use illegal drugs or marijuana.  Talk with your health care  provider before taking any medicines. These include over-the-counter and prescription medicines as well as vitamins and herbal supplements. Some medicines that may be harmful to your baby can pass through breast milk.  It is possible to become pregnant while breastfeeding. If birth control is desired, ask your health care provider about options that will be safe while breastfeeding your baby. Where to find more information: [employer]: www.llli.org Contact a health care provider if:  You feel like you want to stop breastfeeding or have become frustrated with breastfeeding.  Your nipples are cracked or bleeding.  Your breasts are red, tender, or warm.  You have: ? Painful breasts or nipples. ? A swollen area on either breast. ? A fever or chills. ? Nausea or vomiting. ? Drainage other than breast milk from your nipples.  Your breasts do not become full before feedings by the fifth day after you give birth.  You feel sad and depressed.  Your baby is: ? Too sleepy to eat well. ? Having trouble sleeping. ? More than 1 week old and wetting fewer than 6 diapers in a 24-hour period. ? Not gaining weight by 5 days of age.  Your baby has fewer than 3 stools in a 24-hour period.  Your baby's skin or the white parts of   his or her eyes become yellow. Get help right away if:  Your baby is overly tired (lethargic) and does not want to wake up and feed.  Your baby develops an unexplained fever. Summary  Breastfeeding offers many health benefits for infant and mothers.  Try to breastfeed your infant when he or she shows early signs of hunger.  Gently tickle or stroke your baby's lips with your finger or nipple to allow the baby to open his or her mouth. Bring the baby to your breast. Make sure that much of the areola is in your baby's mouth. Offer one side and burp the baby before you offer the other side.  Talk with your health care provider or lactation consultant  if you have questions or you face problems as you breastfeed. This information is not intended to replace advice given to you by your health care provider. Make sure you discuss any questions you have with your health care provider. Document Revised: 04/16/2017 Document Reviewed: 02/22/2016 Elsevier Patient Education  2020 Else[employer] Inc.  

## 2019-03-29 NOTE — Progress Notes (Signed)
   Subjective:    Victoria Robinson is a [id number] Unknown being seen today for her first obstetrical visit.  Her obstetrical history is significant for short interval between pregnancies, advanced maternal age, obesity, pregnancy induced hypertension and genital herpes and CHTN. Patient has been taking amlodipine. Patient does intend to breast feed. Pregnancy history fully reviewed.  Patient reports no complaints.  Vitals:   03/29/19 1556  BP: (!) 145/99  Pulse: 89  Weight: 275 lb (124.7 kg)    HISTORY: OB History  Gravida Para Term Preterm AB Living  5 3 3   1 3   SAB TAB Ectopic Multiple Live Births  1     0 3    # Outcome Date GA Lbr Len/2nd Weight Sex Delivery Anes PTL Lv  5 Current           4 Term 06/17/18 [redacted]w[redacted]d 12:45 / 00:10 7 lb 7.7 oz (3.393 kg) M Vag-Spont EPI  LIV  3 Term 02/03/13 [redacted]w[redacted]d 09:27 / 00:21 8 lb 2 oz (3.685 kg) M Vag-Spont EPI  LIV  2 SAB 2014          1 Term 2010 [redacted]w[redacted]d   M Vag-Spont EPI N LIV   Past Medical History:  Diagnosis Date  . Abnormal Pap smear    repeat WNL  . Asthma childhood  . Shortness of breath    Past Surgical History:  Procedure Laterality Date  . APPENDECTOMY     Family History  Problem Relation Age of Onset  . Hyperlipidemia Mother   . Hyperlipidemia Father   . Hypertension Father   . Diabetes Maternal Grandmother   . Diabetes Paternal Grandmother      Exam    Uterus:     Pelvic Exam:    Perineum: Normal Perineum   Vulva: normal   Vagina:  normal mucosa, normal discharge   pH:    Cervix: multiparous appearance and cervix is closed and long   Adnexa: no mass, fullness, tenderness   Bony Pelvis: gynecoid  System: Breast:  normal appearance, no masses or tenderness   Skin: normal coloration and turgor, no rashes    Neurologic: oriented, no focal deficits   Extremities: normal strength, tone, and muscle mass   HEENT extra ocular movement intact   Mouth/Teeth mucous membranes moist, pharynx normal without lesions  and dental hygiene good   Neck supple and no masses   Cardiovascular: regular rate and rhythm   Respiratory:  appears well, vitals normal, no respiratory distress, acyanotic, normal RR, chest clear, no wheezing, crepitations, rhonchi, normal symmetric air entry   Abdomen: soft, non-tender; bowel sounds normal; no masses,  no organomegaly   Urinary:       Assessment:    Pregnancy: [redacted]w[redacted]d Patient Active Problem List   Diagnosis Date Noted  . Short interval between pregnancies affecting pregnancy, antepartum 03/29/2019  . History of gestational hypertension 03/29/2019  . Supervision of other normal pregnancy, antepartum 03/29/2019  . Chronic hypertension during pregnancy, antepartum 03/29/2019  . History of herpes genitalis 12/09/2017  . Obesity 12/27/2012        Plan:     Initial labs drawn. Prenatal vitamins. Problem list reviewed and updated. Genetic Screening discussed : panorama ordered.  Ultrasound discussed; fetal survey: ordered.  Follow up in 4 weeks. Rx ASA ordered to start next week Rx labetalol provided 50% of 30 min visit spent on counseling and coordination of care.     Victoria Robinson 03/29/2019

## 2019-03-30 LAB — COMPREHENSIVE METABOLIC PANEL
ALT: 7 IU/L (ref 0–32)
AST: 12 IU/L (ref 0–40)
Albumin/Globulin Ratio: 1.5 (ref 1.2–2.2)
Albumin: 4.1 g/dL (ref 3.8–4.8)
Alkaline Phosphatase: 47 IU/L (ref 39–117)
BUN/Creatinine Ratio: 13 (ref 9–23)
BUN: 9 mg/dL (ref 6–20)
Bilirubin Total: <0.2 mg/dL (ref 0.0–1.2)
CO2: 21 mmol/L (ref 20–29)
Calcium: 9.4 mg/dL (ref 8.7–10.2)
Chloride: 99 mmol/L (ref 96–106)
Creatinine, Ser: 0.71 mg/dL (ref 0.57–1.00)
GFR calc Af Amer: 129 mL/min/{1.73_m2} (ref >59)
GFR calc non Af Amer: 111 mL/min/{1.73_m2} (ref >59)
Globulin, Total: 2.8 g/dL (ref 1.5–4.5)
Glucose: 81 mg/dL (ref 65–99)
Potassium: 4.1 mmol/L (ref 3.5–5.2)
Sodium: 134 mmol/L (ref 134–144)
Total Protein: 6.9 g/dL (ref 6.0–8.5)

## 2019-03-30 LAB — PROTEIN / CREATININE RATIO, URINE
Creatinine, Urine: 161.4 mg/dL
Protein, Ur: 14.3 mg/dL
Protein/Creat Ratio: 89 mg/g creat (ref 0–200)

## 2019-03-30 LAB — OBSTETRIC PANEL, INCLUDING HIV
Antibody Screen: NEGATIVE
Basophils Absolute: 0 10*3/uL (ref 0.0–0.2)
Basos: 0 %
EOS (ABSOLUTE): 0.1 10*3/uL (ref 0.0–0.4)
Eos: 1 %
HIV Screen 4th Generation wRfx: NONREACTIVE
Hematocrit: 38.1 % (ref 34.0–46.6)
Hemoglobin: 12.8 g/dL (ref 11.1–15.9)
Hepatitis B Surface Ag: NEGATIVE
Immature Grans (Abs): 0 10*3/uL (ref 0.0–0.1)
Immature Granulocytes: 0 %
Lymphocytes Absolute: 2.3 10*3/uL (ref 0.7–3.1)
Lymphs: 27 %
MCH: 31.3 pg (ref 26.6–33.0)
MCHC: 33.6 g/dL (ref 31.5–35.7)
MCV: 93 fL (ref 79–97)
Monocytes Absolute: 0.7 10*3/uL (ref 0.1–0.9)
Monocytes: 9 %
Neutrophils Absolute: 5.5 10*3/uL (ref 1.4–7.0)
Neutrophils: 63 %
Platelets: 185 10*3/uL (ref 150–450)
RBC: 4.09 x10E6/uL (ref 3.77–5.28)
RDW: 12.8 % (ref 11.7–15.4)
RPR Ser Ql: NONREACTIVE
Rh Factor: POSITIVE
Rubella Antibodies, IGG: 5.52 index (ref >0.99)
WBC: 8.6 10*3/uL (ref 3.4–10.8)

## 2019-03-30 LAB — HEMOGLOBIN A1C
Est. average glucose Bld gHb Est-mCnc: 103 mg/dL
Hgb A1c MFr Bld: 5.2 % (ref 4.8–5.6)

## 2019-03-31 LAB — URINE CULTURE

## 2019-04-01 LAB — CERVICOVAGINAL ANCILLARY ONLY
Bacterial Vaginitis (gardnerella): NEGATIVE
Candida Glabrata: NEGATIVE
Candida Vaginitis: NEGATIVE
Chlamydia: NEGATIVE
Comment: NEGATIVE
Comment: NEGATIVE
Comment: NEGATIVE
Comment: NEGATIVE
Comment: NEGATIVE
Comment: NORMAL
Neisseria Gonorrhea: NEGATIVE
Trichomonas: NEGATIVE

## 2019-04-05 LAB — CYTOLOGY - PAP
Comment: NEGATIVE
Diagnosis: NEGATIVE
High risk HPV: NEGATIVE

## 2019-04-14 ENCOUNTER — Encounter: Payer: Self-pay | Admitting: Obstetrics and Gynecology

## 2019-04-19 ENCOUNTER — Other Ambulatory Visit: Payer: Medicaid Other

## 2019-04-19 ENCOUNTER — Other Ambulatory Visit: Payer: Self-pay

## 2019-04-19 DIAGNOSIS — Z3480 Encounter for supervision of other normal pregnancy, unspecified trimester: Secondary | ICD-10-CM

## 2019-04-19 DIAGNOSIS — O09899 Supervision of other high risk pregnancies, unspecified trimester: Secondary | ICD-10-CM

## 2019-04-26 ENCOUNTER — Encounter: Payer: Self-pay | Admitting: Obstetrics and Gynecology

## 2019-04-26 ENCOUNTER — Other Ambulatory Visit: Payer: Self-pay

## 2019-04-26 ENCOUNTER — Ambulatory Visit (INDEPENDENT_AMBULATORY_CARE_PROVIDER_SITE_OTHER): Payer: BC Managed Care – PPO | Admitting: Obstetrics and Gynecology

## 2019-04-26 VITALS — BP 137/83 | HR 78 | Wt 277.6 lb

## 2019-04-26 DIAGNOSIS — O26899 Other specified pregnancy related conditions, unspecified trimester: Secondary | ICD-10-CM

## 2019-04-26 DIAGNOSIS — R300 Dysuria: Secondary | ICD-10-CM

## 2019-04-26 DIAGNOSIS — Z3480 Encounter for supervision of other normal pregnancy, unspecified trimester: Secondary | ICD-10-CM

## 2019-04-26 DIAGNOSIS — Z3A15 15 weeks gestation of pregnancy: Secondary | ICD-10-CM

## 2019-04-26 DIAGNOSIS — Z8619 Personal history of other infectious and parasitic diseases: Secondary | ICD-10-CM

## 2019-04-26 DIAGNOSIS — O10919 Unspecified pre-existing hypertension complicating pregnancy, unspecified trimester: Secondary | ICD-10-CM

## 2019-04-26 LAB — POCT URINALYSIS DIPSTICK
Bilirubin, UA: NEGATIVE
Blood, UA: NEGATIVE
Glucose, UA: NEGATIVE
Ketones, UA: NEGATIVE
Leukocytes, UA: NEGATIVE
Nitrite, UA: NEGATIVE
Protein, UA: NEGATIVE
Spec Grav, UA: 1.015 (ref 1.010–1.025)
Urobilinogen, UA: 0.2 E.U./dL
pH, UA: 7 (ref 5.0–8.0)

## 2019-04-26 NOTE — Progress Notes (Signed)
Pt is here for ROB, [redacted]w[redacted]d. Pt has c/o discomfort and urinary frequency for about one week.

## 2019-04-26 NOTE — Patient Instructions (Signed)

## 2019-04-26 NOTE — Progress Notes (Signed)
Subjective:  Victoria Robinson is a 34 y.o. [id number] at [redacted]w[redacted]d being seen today for ongoing prenatal care.  She is currently monitored for the following issues for this high-risk pregnancy and has Obesity; History of herpes genitalis; Short interval between pregnancies affecting pregnancy, antepartum; History of gestational hypertension; Supervision of other normal pregnancy, antepartum; and Chronic hypertension during pregnancy, antepartum on their problem list.  Patient reports pain from her plantar fascitis.  Contractions: Not present. Vag. Bleeding: None.  Movement: Present. Denies leaking of fluid.   The following portions of the patient's history were reviewed and updated as appropriate: allergies, current medications, past family history, past medical history, past social history, past surgical history and problem list. Problem list updated.  Objective:   Vitals:   04/26/19 1600  BP: 137/83  Pulse: 78  Weight: 277 lb 9.6 oz (125.9 kg)    Fetal Status: Fetal Heart Rate (bpm): 145   Movement: Present     General:  Alert, oriented and cooperative. Patient is in no acute distress.  Skin: Skin is warm and dry. No rash noted.   Cardiovascular: Normal heart rate noted  Respiratory: Normal respiratory effort, no problems with respiration noted  Abdomen: Soft, gravid, appropriate for gestational age. Pain/Pressure: Absent     Pelvic:  Cervical exam deferred        Extremities: Normal range of motion.  Edema: Trace  Mental Status: Normal mood and affect. Normal behavior. Normal judgment and thought content.   Urinalysis:      Assessment and Plan:  Pregnancy: [id number] at [redacted]w[redacted]d  1. Supervision of other normal pregnancy, antepartum Stable Anatomy scan next month Advised to see PCP for plantar fascitis  2. Chronic hypertension during pregnancy, antepartum BP stable Continue with current treatment and qd BASA  3. History of herpes genitalis Suppression at 36 weeks  4. Dysuria during  pregnancy, antepartum  - POCT Urinalysis Dipstick - Urine Culture  Preterm labor symptoms and general obstetric precautions including but not limited to vaginal bleeding, contractions, leaking of fluid and fetal movement were reviewed in detail with the patient. Please refer to After Visit Summary for other counseling recommendations.  Return in about 4 weeks (around 05/24/2019) for OB visit, virtual, MD provider.   Hermina Staggers, MD

## 2019-04-28 LAB — URINE CULTURE

## 2019-05-24 ENCOUNTER — Ambulatory Visit (HOSPITAL_COMMUNITY)
Admission: RE | Admit: 2019-05-24 | Discharge: 2019-05-24 | Disposition: A | Discharge status: Home / Self Care | Payer: BC Managed Care – PPO | Source: Ambulatory Visit | Attending: Obstetrics and Gynecology | Admitting: Obstetrics and Gynecology

## 2019-05-24 ENCOUNTER — Ambulatory Visit (HOSPITAL_COMMUNITY): Payer: BC Managed Care – PPO | Admitting: *Deleted

## 2019-05-24 ENCOUNTER — Other Ambulatory Visit: Payer: Self-pay

## 2019-05-24 ENCOUNTER — Other Ambulatory Visit (HOSPITAL_COMMUNITY): Payer: Self-pay | Admitting: *Deleted

## 2019-05-24 ENCOUNTER — Encounter (HOSPITAL_COMMUNITY): Payer: Self-pay

## 2019-05-24 ENCOUNTER — Other Ambulatory Visit: Payer: BC Managed Care – PPO

## 2019-05-24 DIAGNOSIS — O99212 Obesity complicating pregnancy, second trimester: Secondary | ICD-10-CM

## 2019-05-24 DIAGNOSIS — O09522 Supervision of elderly multigravida, second trimester: Secondary | ICD-10-CM

## 2019-05-24 DIAGNOSIS — Z3480 Encounter for supervision of other normal pregnancy, unspecified trimester: Secondary | ICD-10-CM

## 2019-05-24 DIAGNOSIS — O10919 Unspecified pre-existing hypertension complicating pregnancy, unspecified trimester: Secondary | ICD-10-CM | POA: Insufficient documentation

## 2019-05-24 DIAGNOSIS — E669 Obesity, unspecified: Secondary | ICD-10-CM

## 2019-05-24 DIAGNOSIS — Z3A19 19 weeks gestation of pregnancy: Secondary | ICD-10-CM

## 2019-05-24 DIAGNOSIS — Z363 Encounter for antenatal screening for malformations: Secondary | ICD-10-CM

## 2019-05-24 DIAGNOSIS — O10912 Unspecified pre-existing hypertension complicating pregnancy, second trimester: Secondary | ICD-10-CM | POA: Diagnosis not present

## 2019-05-26 ENCOUNTER — Encounter: Payer: Self-pay | Admitting: Obstetrics and Gynecology

## 2019-05-26 ENCOUNTER — Telehealth (INDEPENDENT_AMBULATORY_CARE_PROVIDER_SITE_OTHER): Payer: BC Managed Care – PPO | Admitting: Obstetrics and Gynecology

## 2019-05-26 DIAGNOSIS — Z8619 Personal history of other infectious and parasitic diseases: Secondary | ICD-10-CM

## 2019-05-26 DIAGNOSIS — Z3A19 19 weeks gestation of pregnancy: Secondary | ICD-10-CM

## 2019-05-26 DIAGNOSIS — O10919 Unspecified pre-existing hypertension complicating pregnancy, unspecified trimester: Secondary | ICD-10-CM

## 2019-05-26 DIAGNOSIS — O10912 Unspecified pre-existing hypertension complicating pregnancy, second trimester: Secondary | ICD-10-CM

## 2019-05-26 DIAGNOSIS — O09529 Supervision of elderly multigravida, unspecified trimester: Secondary | ICD-10-CM | POA: Insufficient documentation

## 2019-05-26 DIAGNOSIS — O09522 Supervision of elderly multigravida, second trimester: Secondary | ICD-10-CM

## 2019-05-26 DIAGNOSIS — Z3480 Encounter for supervision of other normal pregnancy, unspecified trimester: Secondary | ICD-10-CM

## 2019-05-26 DIAGNOSIS — O99212 Obesity complicating pregnancy, second trimester: Secondary | ICD-10-CM

## 2019-05-26 LAB — AFP, SERUM, OPEN SPINA BIFIDA
AFP MoM: 0.76
AFP Value: 30 ng/mL
Gest. Age on Collection Date: 19 weeks
Maternal Age At EDD: 35 yr
OSBR Risk 1 IN: 10000
Test Results:: NEGATIVE
Weight: 274 [lb_av]

## 2019-05-26 NOTE — Progress Notes (Signed)
OBSTETRICS PRENATAL VIRTUAL VISIT ENCOUNTER NOTE  Provider location: [facility] [place]   I connected with Victoria Robinson on 05/26/19 at 11:15 AM EDT by MyChart Video Encounter at home and verified that I am speaking with the correct person using two identifiers.   I discussed the limitations, risks, security and privacy concerns of performing an evaluation and management service virtually and the availability of in person appointments. I also discussed with the patient that there may be a patient responsible charge related to this service. The patient expressed understanding and agreed to proceed. Subjective:  Victoria Robinson is a 34 y.o. D7O2423 at [redacted]w[redacted]d being seen today for ongoing prenatal care.  She is currently monitored for the following issues for this high-risk pregnancy and has Obesity; History of herpes genitalis; Short interval between pregnancies affecting pregnancy, antepartum; History of gestational hypertension; Supervision of other normal pregnancy, antepartum; Chronic hypertension during pregnancy, antepartum; and AMA (advanced maternal age) multigravida 35+ on their problem list.  Patient reports no complaints.  Contractions: Not present. Vag. Bleeding: None.  Movement: Present. Denies any leaking of fluid.   The following portions of the patient's history were reviewed and updated as appropriate: allergies, current medications, past family history, past medical history, past social history, past surgical history and problem list.   Objective:  There were no vitals filed for this visit.  Fetal Status:     Movement: Present     General:  Alert, oriented and cooperative. Patient is in no acute distress.  Respiratory: Normal respiratory effort, no problems with respiration noted  Mental Status: Normal mood and affect. Normal behavior. Normal judgment and thought content.  Rest of physical exam deferred due to type of encounter  Imaging: [place] MFM  OB DETAIL +14 WK  Result Date: 05/24/2019 ----------------------------------------------------------------------  OBSTETRICS REPORT                       (Signed Final 05/24/2019 11:50 am) ---------------------------------------------------------------------- Patient Info  ID #:       [id number]                          D.O.B.:  07-26-1985 (34 yrs)  Name:       Victoria Robinson              Visit Date: 05/24/2019 11:09 am ---------------------------------------------------------------------- Performed By  Performed By:     Eden Lathe BS      Ref. Address:     [address]                    [address]                                                             [address]                                                             [phone]  [place]                                                             [id number]  Attending:        Ma Rings MD         [facility]  Referred By:      [facility] ---------------------------------------------------------------------- Orders   #  Description                          Code         Ordered By   1  [place] MFM OB DETAIL +14 WK              76811.01     Laria Grimmett  ----------------------------------------------------------------------   #  Order #                    Accession #                 Episode #   1  [id number]                  [id number]                  [id number]  ---------------------------------------------------------------------- Indications   Hypertension - Chronic/Pre-existing            O10.019   (labetalol)   Advanced maternal age multigravida 35+,        O40.522   second trimester (35 by delivery)   [redacted] weeks gestation of pregnancy                Z3A.19   Antenatal screening for malformations          Z36.3   Obesity complicating pregnancy, second         O99.212   trimester (pregravid BMI 50)   ---------------------------------------------------------------------- Fetal Evaluation  Num Of Fetuses:         1  Fetal Heart Rate(bpm):  161  Cardiac Activity:       Observed  Presentation:           Breech  Placenta:               Anterior  P. Cord Insertion:      Visualized  Amniotic Fluid  AFI FV:      Within normal limits                              Largest Pocket(cm)                              6.82 ---------------------------------------------------------------------- Biometry  BPD:      45.2  mm     G. Age:  19w 5d         78  %    CI:        72.83   %    70 - 86                                                          FL/HC:      17.0   %    16.1 - 18.3  HC:      168.4  mm     G. Age:  19w 3d         41  %    HC/AC:      1.22        1.09 - 1.39  AC:      137.9  mm     G. Age:  19w 1d         53  %    FL/BPD:     63.5   %  FL:       28.7  mm     G. Age:  18w 6d         36  %    FL/AC:      20.8   %    20 - 24  HUM:      26.4  mm     G. Age:  18w 2d         32  %  CER:      19.6  mm     G. Age:  18w 6d         44  %  NFT:       2.9  mm  CM:        4.2  mm  Est. FW:     273  gm    0 lb 10 oz      50  % ---------------------------------------------------------------------- OB History  Gravidity:    5         Term:   3        Prem:   0        SAB:   1  TOP:          0       Ectopic:  0        Living: 3 ---------------------------------------------------------------------- Gestational Age  LMP:           19w 0d        Date:  01/11/19                 EDD:   10/18/19  U/S Today:     19w 2d                                        EDD:   10/16/19  Best:          19w 0d     Det. By:  LMP  (01/11/19)          EDD:   10/18/19 ---------------------------------------------------------------------- Anatomy  Cranium:               Appears normal         Aortic Arch:  Appears normal  Cavum:                 Appears normal         Ductal Arch:            Appears normal  Ventricles:            Appears normal          Diaphragm:              Appears normal  Choroid Plexus:        Appears normal         Stomach:                Appears normal, left                                                                        sided  Cerebellum:            Appears normal         Abdomen:                Appears normal  Posterior Fossa:       Appears normal         Abdominal Wall:         Appears nml (cord                                                                        insert, abd wall)  Nuchal Fold:           Appears normal         Cord Vessels:           Appears normal (3                                                                        vessel cord)  Face:                  Appears normal         Kidneys:                Appear normal                         (orbits and profile)  Lips:                  Appears normal         Bladder:                Appears normal  Thoracic:              Appears normal  Spine:                  Appears normal  Heart:                 Appears normal         Upper Extremities:      Appears normal                         (4CH, axis, and                         situs)  RVOT:                  Appears normal         Lower Extremities:      Appears normal  LVOT:                  Appears normal  Other:  Heels/feet and 5th digits visualized. Nasal bone visualized. ---------------------------------------------------------------------- Cervix Uterus Adnexa  Cervix  Length:           4.06  cm.  Normal appearance by transabdominal scan.  Uterus  No abnormality visualized.  Left Ovary  Within normal limits.  Right Ovary  Not visualized.  Cul De Sac  No free fluid seen.  Adnexa  No abnormality visualized. ---------------------------------------------------------------------- Comments  This patient was seen for a detailed fetal anatomy scan due  to advanced maternal age and chronic hypertension currently  treated with labetalol.  She denies any other significant past medical history and  denies any problems  in her current pregnancy.  She had a cell free DNA test earlier in her pregnancy which  indicated a low risk for trisomy 4521, 7918, and 13. A female fetus  is predicted.  She was informed that the fetal growth and amniotic fluid  level were appropriate for her gestational age.  There were no obvious fetal anomalies noted on today's  ultrasound exam.  The patient was informed that anomalies may be missed due  to technical limitations. If the fetus is in a suboptimal position  or maternal habitus is increased, visualization of the fetus in  the maternal uterus may be impaired.  The increased risk of fetal aneuploidy due to advanced  maternal age was discussed. Due to advanced maternal age,  the patient was offered and declined an amniocentesis today  for definitive diagnosis of fetal aneuploidy.  The implications and management of chronic hypertension in  pregnancy was discussed. The patient was advised that  should her blood pressures continue to be elevated, the  dosage of her antihypertensive medications may need to be  increased.  The increased risk of superimposed  preeclampsia, an indicated preterm delivery, and possible  fetal growth restriction due to chronic hypertension in  pregnancy was discussed. The patient was advised that we  will continue to follow her closely throughout her pregnancy.  We will continue to follow her with monthly growth scans.  Weekly fetal testing should be started at around 32 weeks.  To decrease her risk of superimposed preeclampsia, she  should continue taking a daily baby aspirin (81 mg daily) for  preeclampsia prophylaxis.  A follow-up exam was scheduled in 4 weeks. ----------------------------------------------------------------------                   Ma RingsVictor Fang, MD Electronically Signed Final Report   05/24/2019 11:50 am ----------------------------------------------------------------------   Assessment and  Plan:  Pregnancy: [id number] at [redacted]w[redacted]d 1. Supervision of other normal  pregnancy, antepartum Patient is doing well without complaints Reviewed 4/20 ultrasound and AFP results with patients Patient reports occasional heart palpitations- will refer to cardiologist Patient expressed interest in waterbirth- information provided and patient will be scheduled to see a midwife at some point to determine eligibility  2. Chronic hypertension during pregnancy, antepartum Normotensive Continue labetalol and ASA Follow up growth ultrasound scheduled  3. Multigravida of advanced maternal age in second trimester   4. History of herpes genitalis Prophylaxis at 36 weeks  5. Class 3 severe obesity with body mass index (BMI) of 45.0 to 49.9 in adult, unspecified obesity type, unspecified whether serious comorbidity present (HCC)   Preterm labor symptoms and general obstetric precautions including but not limited to vaginal bleeding, contractions, leaking of fluid and fetal movement were reviewed in detail with the patient. I discussed the assessment and treatment plan with the patient. The patient was provided an opportunity to ask questions and all were answered. The patient agreed with the plan and demonstrated an understanding of the instructions. The patient was advised to call back or seek an in-person office evaluation/go to MAU at [facility] for any urgent or concerning symptoms. Please refer to After Visit Summary for other counseling recommendations.   I provided 15 minutes of face-to-face time during this encounter.  No follow-ups on file.  Future Appointments  Date Time Provider Department Center  05/26/2019 11:15 AM Sherrica Niehaus, Gigi Gin, MD [facility] None  06/23/2019 10:00 AM [facility] NURSE [facility]  06/23/2019 10:00 AM [facility] [place] 1 [facility]    Catalina Antigua, MD [facility] [employer], [facility]

## 2019-05-26 NOTE — Progress Notes (Signed)
Pt states BP yesterday was 136/84. Pt had u/s on 05/24/19.

## 2019-05-26 NOTE — Patient Instructions (Signed)
Considering Waterbirth? Guide for patients at [facility]  Why consider waterbirth?  . Gentle birth for babies . Less pain medicine used in labor . May allow for passive descent/less pushing . May reduce perineal tears  . More mobility and instinctive maternal position changes . Increased maternal relaxation . Reduced blood pressure in labor  Is waterbirth safe? What are the risks of infection, drowning or other complications?  . Infection: o Very low risk (3.7 % for tub vs 4.8% for bed) o 7 in 8000 waterbirths with documented infection o Poorly cleaned equipment most common cause o Slightly lower group B strep transmission rate  . Drowning o Maternal:  - Very low risk   - Related to seizures or fainting o Newborn:  - Very low risk. No evidence of increased risk of respiratory problems in multiple large studies - Physiological protection from breathing under water - Avoid underwater birth if there are any fetal complications - Once baby's head is out of the water, keep it out.  . Birth complication o Some reports of cord trauma, but risk decreased by bringing baby to surface gradually o No evidence of increased risk of shoulder dystocia. Mothers can usually change positions faster in water than in a bed, possibly aiding the maneuvers to free the shoulder.   You must attend a Waterbirth class at [facility]  3rd Wednesday of every month from 7-9pm  Free  Register by calling [phone] or online at [email or web address]  Bring us the certificate from the class to your prenatal appointment  Meet with a midwife at 36 weeks to see if you can still plan a waterbirth and to sign the consent.   If you plan a waterbirth at [facility] at , you will need to purchase the following:  Fish net  Bathing suit top (optional)  Long-handled mirror (optional)  Places to purchase or rent supplies:   Yourwaterbirth.com  for tub purchases and supplies  [employer]births[employer].com for tub purchases and supplies  [employer] (www.[email or web address]) $275 for tub rental/set-up & take down/kit   [employer] (http://www.padanc.org/MeetUs.htm) Information regarding doulas (labor support) who provide pool rentals  Things that would prevent you from having a waterbirth:  Premature, <37wks  Previous cesarean birth  Presence of thick meconium-stained fluid  Multiple gestation (Twins, triplets, etc.)  Uncontrolled diabetes or gestational diabetes requiring medication  Hypertension requiring medication or diagnosis of pre-eclampsia  Heavy vaginal bleeding  Non-reassuring fetal heart rate  Active infection (MRSA, etc.). Group B Strep is NOT a contraindication for waterbirth.  If your labor has to be induced and induction method requires continuous monitoring of the baby's heart rate  Other risks/issues identified by your obstetrical provider  Please remember that birth is unpredictable. Under certain unforeseeable circumstances your provider may advise against giving birth in the tub. These decisions will be made on a case-by-case basis and with the safety of you and your baby as our highest priority.    

## 2019-06-12 NOTE — Progress Notes (Signed)
Cardiology Office Note:    Date:  06/16/2019   ID:  [name], DOB [birth year], MRN [id number]  PCP:  Maryagnes Amos, FNP  Cardiologist:  No primary care provider on file.  Electrophysiologist:  None   Referring MD: Catalina Antigua, MD   Chief Complaint  Patient presents with  . Palpitations    History of Present Illness:    Victoria Robinson is a 34 y.o. female who is [redacted] weeks pregnant and referred by Dr. Jolayne Panther for evaluation of palpitations.  She has had hypertension during pregnancy and is on labetalol.  No history of hypertension outside of pregnancy.  States that last month she began having palpitations.  She states feels like her heart is racing and irregular during episodes.  Occurring every 1 to 2 days.  Last for about 1 minute and resolves.  Does report some lightheadedness during episodes.  Denies any chest pain or dyspnea.  Denies any palpitations prior to last month.  She has smoked 1 pack/week for 9 years, but stopped during her pregnancies.  No history of heart disease in her immediate family.   Past Medical History:  Diagnosis Date  . Abnormal Pap smear    repeat WNL  . Asthma childhood  . Shortness of breath     Past Surgical History:  Procedure Laterality Date  . APPENDECTOMY      Current Medications: Current Meds  Medication Sig  . acetaminophen (TYLENOL) 325 MG tablet Take 650 mg by mouth every 6 (six) hours as needed for moderate pain.  Marland Kitchen aspirin EC 81 MG tablet Take 1 tablet (81 mg total) by mouth daily. Take after 12 weeks for prevention of preeclampsia later in pregnancy  . labetalol (NORMODYNE) 200 MG tablet Take 1 tablet (200 mg total) by mouth 2 (two) times daily.  . Prenatal Vit w/Fe-Methylfol-FA (PNV PO) Take 1 tablet by mouth daily.      Allergies:   Patient has no known allergies.   Social History   Socioeconomic History  . Marital status: Single    Spouse name: Not on file  . Number of children: Not on file  . Years  of education: Not on file  . Highest education level: Not on file  Occupational History  . Not on file  Tobacco Use  . Smoking status: Former Smoker    Packs/day: 0.50    Types: Cigarettes  . Smokeless tobacco: Never Used  Substance and Sexual Activity  . Alcohol use: Not Currently    Comment: occasionally   . Drug use: No  . Sexual activity: Yes    Partners: Male    Birth control/protection: None  Other Topics Concern  . Not on file  Social History Narrative  . Not on file   Social Determinants of Health   Financial Resource Strain:   . Difficulty of Paying Living Expenses:   Food Insecurity:   . Worried About Programme researcher, broadcasting/film/video in the Last Year:   . Barista in the Last Year:   Transportation Needs:   . Freight forwarder (Medical):   Marland Kitchen Lack of Transportation (Non-Medical):   Physical Activity:   . Days of Exercise per Week:   . Minutes of Exercise per Session:   Stress:   . Feeling of Stress :   Social Connections:   . Frequency of Communication with Friends and Family:   . Frequency of Social Gatherings with Friends and Family:   . Attends Religious Services:   .  Active Member of Clubs or Organizations:   . Attends Archivist Meetings:   Marland Kitchen Marital Status:      Family History: The patient's family history includes Diabetes in her maternal grandmother and paternal grandmother; Hyperlipidemia in her father and mother; Hypertension in her father.  ROS:   Please see the history of present illness.     All other systems reviewed and are negative.  EKGs/Labs/Other Studies Reviewed:    The following studies were reviewed today:   EKG:  EKG is ordered today.  The ekg ordered today demonstrates sinus rhythm, rate 78, first-degree AV block  Recent Labs: 03/29/2019: ALT 7; BUN 9; Creatinine, Ser 0.71; Hemoglobin 12.8; Platelets 185; Potassium 4.1; Sodium 134  Recent Lipid Panel No results found for: CHOL, TRIG, HDL, CHOLHDL, VLDL, LDLCALC,  LDLDIRECT  Physical Exam:    VS:  BP 122/78   Pulse 78   Ht 5\' 1"  (1.549 m)   Wt 281 lb (127.5 kg)   LMP 01/11/2019   SpO2 98%   BMI 53.09 kg/m     Wt Readings from Last 3 Encounters:  06/16/19 281 lb (127.5 kg)  06/13/19 280 lb (127 kg)  04/26/19 277 lb 9.6 oz (125.9 kg)     GEN: in no acute distress HEENT: Normal NECK: No JVD; No carotid bruits LYMPHATICS: No lymphadenopathy CARDIAC: RRR, no murmurs, rubs, gallops RESPIRATORY:  Clear to auscultation without rales, wheezing or rhonchi  ABDOMEN: Soft, non-tender, non-distended MUSCULOSKELETAL:  No edema; No deformity  SKIN: Warm and dry NEUROLOGIC:  Alert and oriented x 3 PSYCHIATRIC:  Normal affect   ASSESSMENT:    1. Palpitations   2. Essential hypertension    PLAN:    In order of problems listed above:  Palpitations: Description concerning for arrhythmia, will evaluate with Zio patch x7 days  Hypertension: On labetalol 200 mg twice daily.  Normotensive in clinic today  RTC in 2 months   Medication Adjustments/Labs and Tests Ordered: Current medicines are reviewed at length with the patient today.  Concerns regarding medicines are outlined above.  Orders Placed This Encounter  Procedures  . LONG TERM MONITOR (3-14 DAYS)  . EKG 12-Lead   No orders of the defined types were placed in this encounter.   Patient Instructions  Medication Instructions:  Your physician recommends that you continue on your current medications as directed. Please refer to the Current Medication list given to you today.  Testing/Procedures:  Bryn Gulling- Long Term Monitor Instructions   Your physician has requested you wear your ZIO patch monitor 7 days.   This is a single patch monitor.  [employer] supplies one patch monitor per enrollment.  Additional stickers are not available.   Please do not apply patch if you will be having a Nuclear Stress Test, Echocardiogram, Cardiac CT, MRI, or Chest Xray during the time frame you would  be wearing the monitor. The patch cannot be worn during these tests.  You cannot remove and re-apply the ZIO XT patch monitor.   Your ZIO patch monitor will be sent USPS Priority mail from [facility] directly to your home address. The monitor may also be mailed to a PO BOX if home delivery is not available.   It may take 3-5 days to receive your monitor after you have been enrolled.   Once you have received you monitor, please review enclosed instructions.  Your monitor has already been registered assigning a specific monitor serial # to you.   Applying the monitor  Shave hair from upper left chest.   Hold abrader disc by orange tab.  Rub abrader in 40 strokes over left upper chest as indicated in your monitor instructions.   Clean area with 4 enclosed alcohol pads .  Use all pads to assure are is cleaned thoroughly.  Let dry.   Apply patch as indicated in monitor instructions.  Patch will be place under collarbone on left side of chest with arrow pointing upward.   Rub patch adhesive wings for 2 minutes.Remove white label marked "1".  Remove white label marked "2".  Rub patch adhesive wings for 2 additional minutes.   While looking in a mirror, press and release button in center of patch.  A small green light will flash 3-4 times .  This will be your only indicator the monitor has been turned on.     Do not shower for the first 24 hours.  You may shower after the first 24 hours.   Press button if you feel a symptom. You will hear a small click.  Record Date, Time and Symptom in the Patient Log Book.   When you are ready to remove patch, follow instructions on last 2 pages of Patient Log Book.  Stick patch monitor onto last page of Patient Log Book.   Place Patient Log Book in [place] box.  Use locking tab on box and tape box closed securely.  The Orange and [employer] has [employer] on it.  Please place in mailbox as soon as possible.  Your physician should have your test  results approximately 7 days after the monitor has been mailed back to [facility]   Call [facility] Customer Care at [phone] if you have questions regarding your ZIO XT patch monitor.  Call them immediately if you see an orange light blinking on your monitor.   If your monitor falls off in less than 4 days contact our Monitor department at [phone].  If your monitor becomes loose or falls off after 4 days call [employer] at [phone] for suggestions on securing your monitor.   Follow-Up: At [facility] you and your health needs are our priority.  As part of our continuing mission to provide you with exceptional heart care, we have created designated Provider Care Teams.  These Care Teams include your primary Cardiologist (physician) and Advanced Practice Providers (APPs -  Physician Assistants and Nurse Practitioners) who all work together to provide you with the care you need, when you need it.  We recommend signing up for the patient portal called "MyChart".  Sign up information is provided on this After Visit Summary.  MyChart is used to connect with patients for Virtual Visits (Telemedicine).  Patients are able to view lab/test results, encounter notes, upcoming appointments, etc.  Non-urgent messages can be sent to your provider as well.   To learn more about what you can do with MyChart, go to [email or web address].    Your next appointment:   2 month(s)  The format for your next appointment:   Virtual Visit   Provider:   Epifanio Lesches, MD         Signed, Little Ishikawa, MD  06/16/2019 9:43 AM    [facility]

## 2019-06-13 ENCOUNTER — Other Ambulatory Visit: Payer: Self-pay

## 2019-06-13 ENCOUNTER — Encounter: Payer: Self-pay | Admitting: Obstetrics and Gynecology

## 2019-06-13 ENCOUNTER — Ambulatory Visit (INDEPENDENT_AMBULATORY_CARE_PROVIDER_SITE_OTHER): Payer: BC Managed Care – PPO | Admitting: Obstetrics and Gynecology

## 2019-06-13 VITALS — BP 121/76 | HR 85 | Wt 280.0 lb

## 2019-06-13 DIAGNOSIS — Z8619 Personal history of other infectious and parasitic diseases: Secondary | ICD-10-CM

## 2019-06-13 DIAGNOSIS — O09522 Supervision of elderly multigravida, second trimester: Secondary | ICD-10-CM

## 2019-06-13 DIAGNOSIS — E66813 Obesity, class 3: Secondary | ICD-10-CM

## 2019-06-13 DIAGNOSIS — Z6841 Body Mass Index (BMI) 40.0 and over, adult: Secondary | ICD-10-CM

## 2019-06-13 DIAGNOSIS — O10919 Unspecified pre-existing hypertension complicating pregnancy, unspecified trimester: Secondary | ICD-10-CM

## 2019-06-13 DIAGNOSIS — Z3480 Encounter for supervision of other normal pregnancy, unspecified trimester: Secondary | ICD-10-CM

## 2019-06-13 NOTE — Progress Notes (Signed)
   PRENATAL VISIT NOTE  Subjective:  Victoria Robinson is a 34 y.o. [id number] at [redacted]w[redacted]d being seen today for ongoing prenatal care.  She is currently monitored for the following issues for this high-risk pregnancy and has Obesity; History of herpes genitalis; Short interval between pregnancies affecting pregnancy, antepartum; History of gestational hypertension; Supervision of other normal pregnancy, antepartum; Chronic hypertension during pregnancy, antepartum; and AMA (advanced maternal age) multigravida 35+ on their problem list.  Patient reports no complaints.  Contractions: Not present. Vag. Bleeding: None.  Movement: Present. Denies leaking of fluid.   The following portions of the patient's history were reviewed and updated as appropriate: allergies, current medications, past family history, past medical history, past social history, past surgical history and problem list.   Objective:   Vitals:   06/13/19 1359  BP: 121/76  Pulse: 85  Weight: 280 lb (127 kg)    Fetal Status: Fetal Heart Rate (bpm): 151 Fundal Height: 22 cm Movement: Present     General:  Alert, oriented and cooperative. Patient is in no acute distress.  Skin: Skin is warm and dry. No rash noted.   Cardiovascular: Normal heart rate noted  Respiratory: Normal respiratory effort, no problems with respiration noted  Abdomen: Soft, gravid, appropriate for gestational age.  Pain/Pressure: Absent     Pelvic: Cervical exam deferred        Extremities: Normal range of motion.  Edema: None  Mental Status: Normal mood and affect. Normal behavior. Normal judgment and thought content.   Assessment and Plan:  Pregnancy: [id number] at [redacted]w[redacted]d 1. Supervision of other normal pregnancy, antepartum Patient is doing well without complaints Cardiology scheduled on 5/13 for evaluation of heart palpitation  2. Chronic hypertension during pregnancy, antepartum Continue labetalol and ASA Follow up growth 5/20  3. History of herpes  genitalis Prophylaxis at 36 weeks  4. Multigravida of advanced maternal age in second trimester   5. Class 3 severe obesity with body mass index (BMI) of 45.0 to 49.9 in adult, unspecified obesity type, unspecified whether serious comorbidity present (HCC)   Preterm labor symptoms and general obstetric precautions including but not limited to vaginal bleeding, contractions, leaking of fluid and fetal movement were reviewed in detail with the patient. Please refer to After Visit Summary for other counseling recommendations.   Return in about 4 weeks (around 07/11/2019) for ROB, High risk, Virtual.  Future Appointments  Date Time Provider Department Center  06/16/2019  8:40 AM Little Ishikawa, MD [facility]  06/23/2019 10:00 AM [facility] NURSE [facility]  06/23/2019 10:00 AM [facility] US1 [facility]    Catalina Antigua, MD

## 2019-06-16 ENCOUNTER — Encounter: Payer: Self-pay | Admitting: Cardiology

## 2019-06-16 ENCOUNTER — Ambulatory Visit (INDEPENDENT_AMBULATORY_CARE_PROVIDER_SITE_OTHER): Payer: BC Managed Care – PPO | Admitting: Cardiology

## 2019-06-16 ENCOUNTER — Other Ambulatory Visit: Payer: Self-pay

## 2019-06-16 ENCOUNTER — Encounter: Payer: Self-pay | Admitting: *Deleted

## 2019-06-16 VITALS — BP 122/78 | HR 78 | Ht 61.0 in | Wt 281.0 lb

## 2019-06-16 DIAGNOSIS — R002 Palpitations: Secondary | ICD-10-CM

## 2019-06-16 DIAGNOSIS — I10 Essential (primary) hypertension: Secondary | ICD-10-CM

## 2019-06-16 NOTE — Progress Notes (Signed)
Patient ID: Victoria Robinson, female   DOB: 04-19-1985, 34 y.o.   MRN: [id number] 7 day ZIO XT long term holter monitor shipped to patients home.

## 2019-06-16 NOTE — Patient Instructions (Signed)
Medication Instructions:  Your physician recommends that you continue on your current medications as directed. Please refer to the Current Medication list given to you today.  Testing/Procedures:  Christena Deem- Long Term Monitor Instructions   Your physician has requested you wear your ZIO patch monitor 7 days.   This is a single patch monitor.  [employer] supplies one patch monitor per enrollment.  Additional stickers are not available.   Please do not apply patch if you will be having a Nuclear Stress Test, Echocardiogram, Cardiac CT, MRI, or Chest Xray during the time frame you would be wearing the monitor. The patch cannot be worn during these tests.  You cannot remove and re-apply the ZIO XT patch monitor.   Your ZIO patch monitor will be sent USPS Priority mail from [facility] directly to your home address. The monitor may also be mailed to a PO BOX if home delivery is not available.   It may take 3-5 days to receive your monitor after you have been enrolled.   Once you have received you monitor, please review enclosed instructions.  Your monitor has already been registered assigning a specific monitor serial # to you.   Applying the monitor   Shave hair from upper left chest.   Hold abrader disc by orange tab.  Rub abrader in 40 strokes over left upper chest as indicated in your monitor instructions.   Clean area with 4 enclosed alcohol pads .  Use all pads to assure are is cleaned thoroughly.  Let dry.   Apply patch as indicated in monitor instructions.  Patch will be place under collarbone on left side of chest with arrow pointing upward.   Rub patch adhesive wings for 2 minutes.Remove white label marked "1".  Remove white label marked "2".  Rub patch adhesive wings for 2 additional minutes.   While looking in a mirror, press and release button in center of patch.  A small green light will flash 3-4 times .  This will be your only indicator the monitor has been turned on.      Do not shower for the first 24 hours.  You may shower after the first 24 hours.   Press button if you feel a symptom. You will hear a small click.  Record Date, Time and Symptom in the Patient Log Book.   When you are ready to remove patch, follow instructions on last 2 pages of Patient Log Book.  Stick patch monitor onto last page of Patient Log Book.   Place Patient Log Book in [place] box.  Use locking tab on box and tape box closed securely.  The [employer] and [employer] has [employer] on it.  Please place in mailbox as soon as possible.  Your physician should have your test results approximately 7 days after the monitor has been mailed back to [facility].   Call [facility] Customer Care at [phone] if you have questions regarding your ZIO XT patch monitor.  Call them immediately if you see an orange light blinking on your monitor.   If your monitor falls off in less than 4 days contact our Monitor department at [phone].  If your monitor becomes loose or falls off after 4 days call [employer] at [phone] for suggestions on securing your monitor.   Follow-Up: At [facility], you and your health needs are our priority.  As part of our continuing mission to provide you with exceptional heart care, we have created designated Provider Care Teams.  These Care Teams include your primary Cardiologist (physician) and Advanced Practice Providers (APPs -  Physician Assistants and Nurse Practitioners) who all work together to provide you with the care you need, when you need it.  We recommend signing up for the patient portal called "MyChart".  Sign up information is provided on this After Visit Summary.  MyChart is used to connect with patients for Virtual Visits (Telemedicine).  Patients are able to view lab/test results, encounter notes, upcoming appointments, etc.  Non-urgent messages can be sent to your provider as well.   To learn more about what you can do with MyChart, go to  [email or web address].    Your next appointment:   2 month(s)  The format for your next appointment:   Virtual Visit   Provider:   Epifanio Lesches, MD

## 2019-06-20 ENCOUNTER — Telehealth: Payer: Self-pay

## 2019-06-20 NOTE — Telephone Encounter (Signed)
Returned call and pt stated that she accidentally took 400mg  of labetalol this morning instead of 200mg  BID, advised per provider to skip afternoon dose, pt agreed, BP currently 121/78. Advised to call back if experiencing any abnormal symptoms.

## 2019-06-23 ENCOUNTER — Ambulatory Visit: Payer: BC Managed Care – PPO | Admitting: *Deleted

## 2019-06-23 ENCOUNTER — Other Ambulatory Visit: Payer: Self-pay

## 2019-06-23 ENCOUNTER — Ambulatory Visit (HOSPITAL_COMMUNITY): Payer: BC Managed Care – PPO | Attending: Obstetrics and Gynecology

## 2019-06-23 ENCOUNTER — Other Ambulatory Visit: Payer: Self-pay | Admitting: *Deleted

## 2019-06-23 DIAGNOSIS — Z362 Encounter for other antenatal screening follow-up: Secondary | ICD-10-CM | POA: Diagnosis not present

## 2019-06-23 DIAGNOSIS — E669 Obesity, unspecified: Secondary | ICD-10-CM

## 2019-06-23 DIAGNOSIS — O10919 Unspecified pre-existing hypertension complicating pregnancy, unspecified trimester: Secondary | ICD-10-CM | POA: Insufficient documentation

## 2019-06-23 DIAGNOSIS — O09522 Supervision of elderly multigravida, second trimester: Secondary | ICD-10-CM

## 2019-06-23 DIAGNOSIS — O99212 Obesity complicating pregnancy, second trimester: Secondary | ICD-10-CM | POA: Diagnosis not present

## 2019-06-23 DIAGNOSIS — O10912 Unspecified pre-existing hypertension complicating pregnancy, second trimester: Secondary | ICD-10-CM

## 2019-06-23 DIAGNOSIS — O10012 Pre-existing essential hypertension complicating pregnancy, second trimester: Secondary | ICD-10-CM

## 2019-06-23 DIAGNOSIS — Z3A23 23 weeks gestation of pregnancy: Secondary | ICD-10-CM

## 2019-07-11 ENCOUNTER — Encounter: Payer: Self-pay | Admitting: Obstetrics and Gynecology

## 2019-07-11 ENCOUNTER — Telehealth (INDEPENDENT_AMBULATORY_CARE_PROVIDER_SITE_OTHER): Payer: BC Managed Care – PPO | Admitting: Obstetrics and Gynecology

## 2019-07-11 VITALS — BP 114/71 | HR 94

## 2019-07-11 DIAGNOSIS — O09522 Supervision of elderly multigravida, second trimester: Secondary | ICD-10-CM

## 2019-07-11 DIAGNOSIS — Z3A25 25 weeks gestation of pregnancy: Secondary | ICD-10-CM

## 2019-07-11 DIAGNOSIS — O10919 Unspecified pre-existing hypertension complicating pregnancy, unspecified trimester: Secondary | ICD-10-CM

## 2019-07-11 DIAGNOSIS — Z8619 Personal history of other infectious and parasitic diseases: Secondary | ICD-10-CM

## 2019-07-11 DIAGNOSIS — O10012 Pre-existing essential hypertension complicating pregnancy, second trimester: Secondary | ICD-10-CM

## 2019-07-11 DIAGNOSIS — Z3480 Encounter for supervision of other normal pregnancy, unspecified trimester: Secondary | ICD-10-CM

## 2019-07-11 NOTE — Progress Notes (Signed)
I connected with  Victoria Robinson on 07/11/19 by a video enabled telemedicine application and verified that I am speaking with the correct person using two identifiers.   I discussed the limitations of evaluation and management by telemedicine. The patient expressed understanding and agreed to proceed.  MyChart OB, reports no problems today

## 2019-07-11 NOTE — Progress Notes (Signed)
OBSTETRICS PRENATAL VIRTUAL VISIT ENCOUNTER NOTE  Provider location: [facility] [place]   I connected with Victoria Robinson on 07/11/19 at  1:30 PM EDT by MyChart Video Encounter at home and verified that I am speaking with the correct person using two identifiers.   I discussed the limitations, risks, security and privacy concerns of performing an evaluation and management service virtually and the availability of in person appointments. I also discussed with the patient that there may be a patient responsible charge related to this service. The patient expressed understanding and agreed to proceed. Subjective:  RHIANNAN KIEVIT is a 34 y.o. H9Q2229 at [redacted]w[redacted]d being seen today for ongoing prenatal care.  She is currently monitored for the following issues for this high-risk pregnancy and has Obesity; History of herpes genitalis; Short interval between pregnancies affecting pregnancy, antepartum; History of gestational hypertension; Supervision of other normal pregnancy, antepartum; Chronic hypertension during pregnancy, antepartum; and AMA (advanced maternal age) multigravida 35+ on their problem list.  Patient reports no complaints.  Contractions: Not present. Vag. Bleeding: None.  Movement: Present. Denies any leaking of fluid.   The following portions of the patient's history were reviewed and updated as appropriate: allergies, current medications, past family history, past medical history, past social history, past surgical history and problem list.   Objective:   Vitals:   07/11/19 1327  BP: 114/71  Pulse: 94    Fetal Status:     Movement: Present     General:  Alert, oriented and cooperative. Patient is in no acute distress.  Respiratory: Normal respiratory effort, no problems with respiration noted  Mental Status: Normal mood and affect. Normal behavior. Normal judgment and thought content.  Rest of physical exam deferred due to type of  encounter  Imaging: [place] MFM OB FOLLOW UP  Result Date: 06/23/2019 ----------------------------------------------------------------------  OBSTETRICS REPORT                       (Signed Final 06/23/2019 11:10 am) ---------------------------------------------------------------------- Patient Info  ID #:       [id number]                          D.O.B.:  [birth year] (33 yrs)  Name:       Victoria Robinson              Visit Date: 06/23/2019 10:24 am ---------------------------------------------------------------------- Performed By  Attending:        Noralee Space MD        Ref. Address:     [address]                                                             [phone]  [place]                                                             [id number]  Performed By:     Sandi MealyJovancia Adrien        [facility]                                     [facility]  [facility]:      [facility] ---------------------------------------------------------------------- Orders  #  Description                           Code        Ordered By  1  US MFM OB FOLLOW UP                   [id number]    YU FANG ----------------------------------------------------------------------  #  Order #                     Accession #                Episode #  1  [id number]                   [id number]                 [id number] ---------------------------------------------------------------------- Indications  [redacted] weeks gestation of pregnancy                Z3A.23  Hypertension - Chronic/Pre-existing            O10.019  (labetalol)  Advanced maternal age multigravida 9435+,        84O09.522  second trimester (35 by delivery)  Encounter for other antenatal screening        Z36.2  follow-up  Obesity complicating pregnancy, second         O99.212  trimester (pregravid BMI 50)  ---------------------------------------------------------------------- Fetal Evaluation  Num Of Fetuses:         1  Fetal Heart Rate(bpm):  146  Cardiac Activity:       Observed  Presentation:           Breech  Placenta:               Anterior  P. Cord Insertion:      Visualized  Amniotic Fluid  AFI FV:      Within normal limits                              Largest Pocket(cm)                              8.1 ---------------------------------------------------------------------- Biometry  BPD:      59.3  mm     G. Age:  24w 2d         78  %    CI:  74.18   %    70 - 86                                                          FL/HC:      18.6   %    19.2 - 20.8  HC:      218.6  mm     G. Age:  23w 6d         59  %    HC/AC:      1.19        1.05 - 1.21  AC:      184.1  mm     G. Age:  23w 2d         39  %    FL/BPD:     68.6   %    71 - 87  FL:       40.7  mm     G. Age:  23w 1d         35  %    FL/AC:      22.1   %    20 - 24  HUM:      39.3  mm     G. Age:  24w 0d         58  %  LV:        4.3  mm  Est. FW:     583  gm      1 lb 5 oz     43  % ---------------------------------------------------------------------- OB History  Gravidity:    5         Term:   3        Prem:   0        SAB:   1  TOP:          0       Ectopic:  0        Living: 3 ---------------------------------------------------------------------- Gestational Age  LMP:           23w 2d        Date:  01/11/19                 EDD:   10/18/19  U/S Today:     23w 5d                                        EDD:   10/15/19  Best:          23w 2d     Det. By:  LMP  (01/11/19)          EDD:   10/18/19 ---------------------------------------------------------------------- Anatomy  Cranium:               Appears normal         Aortic Arch:            Previously seen  Cavum:                 Appears normal         Ductal Arch:            Previously seen  Ventricles:  Appears normal         Diaphragm:              Appears normal  Choroid Plexus:         Previously seen        Stomach:                Appears normal, left                                                                        sided  Cerebellum:            Previously seen        Abdomen:                Appears normal  Posterior Fossa:       Previously seen        Abdominal Wall:         Previously seen  Nuchal Fold:           Not applicable (>20    Cord Vessels:           Previously seen                         wks GA)  Face:                  Orbits and profile     Kidneys:                Appear normal                         previously seen  Lips:                  Previously seen        Bladder:                Appears normal  Thoracic:              Appears normal         Spine:                  Previously seen  Heart:                 Appears normal         Upper Extremities:      Previously seen                         (4CH, axis, and                         situs)  RVOT:                  Previously seen        Lower Extremities:      Previously seen  LVOT:                  Previously seen  Other:  Heels/feet and 5th digits visualized. Nasal bone visualized. ---------------------------------------------------------------------- Impression  Chronic hypertension. Well-controlled on labetalol .  Amniotic fluid is normal and  good fetal activity is seen .Fetal  growth is appropriate for gestational age . ---------------------------------------------------------------------- Recommendations  -An appointment was made for her to return in 4 weeks for  fetal growth assessment. ----------------------------------------------------------------------                  Tama High, MD Electronically Signed Final Report   06/23/2019 11:10 am ----------------------------------------------------------------------   Assessment and Plan:  Pregnancy: Q7H4193 at [redacted]w[redacted]d 1. Supervision of other normal pregnancy, antepartum Stable Glucola next visit  2. Multigravida of advanced maternal age in second  trimester Normal genetic studies  3. Chronic hypertension during pregnancy, antepartum BP 120-130's/70-80's at home per pt Continue with Labetalol and BASA Growth 43 % on 06/23/19 F/U growth scan scheduled  4. History of herpes genitalis Suppressions at 36 weeks  Preterm labor symptoms and general obstetric precautions including but not limited to vaginal bleeding, contractions, leaking of fluid and fetal movement were reviewed in detail with the patient. I discussed the assessment and treatment plan with the patient. The patient was provided an opportunity to ask questions and all were answered. The patient agreed with the plan and demonstrated an understanding of the instructions. The patient was advised to call back or seek an in-person office evaluation/go to [facility] at [facility] for any urgent or concerning symptoms. Please refer to After Visit Summary for other counseling recommendations.   I provided 8 minutes of face-to-face time during this encounter.  Return in about 2 weeks (around 07/25/2019) for OB visit, face to face, MD only, fasting for Glucola.  Future Appointments  Date Time Provider [place]  07/21/2019 10:45 AM [facility] NURSE [facility]  07/21/2019 10:45 AM [facility] US4 [facility]  08/18/2019  9:00 AM Donato Heinz, MD [facility]    Chancy Milroy, [place] for [facility], [place]

## 2019-07-21 ENCOUNTER — Emergency Department (HOSPITAL_COMMUNITY): Payer: BC Managed Care – PPO

## 2019-07-21 ENCOUNTER — Ambulatory Visit: Payer: BC Managed Care – PPO | Admitting: *Deleted

## 2019-07-21 ENCOUNTER — Emergency Department (HOSPITAL_COMMUNITY)
Admission: EM | Admit: 2019-07-21 | Discharge: 2019-07-21 | Disposition: A | Discharge status: Home / Self Care | Payer: BC Managed Care – PPO | Attending: Emergency Medicine | Admitting: Emergency Medicine

## 2019-07-21 ENCOUNTER — Encounter (HOSPITAL_COMMUNITY): Payer: Self-pay

## 2019-07-21 ENCOUNTER — Ambulatory Visit (HOSPITAL_BASED_OUTPATIENT_CLINIC_OR_DEPARTMENT_OTHER): Payer: BC Managed Care – PPO

## 2019-07-21 ENCOUNTER — Ambulatory Visit (INDEPENDENT_AMBULATORY_CARE_PROVIDER_SITE_OTHER)
Admission: EM | Admit: 2019-07-21 | Discharge: 2019-07-21 | Disposition: A | Discharge status: Home / Self Care | Payer: BC Managed Care – PPO | Source: Home / Self Care

## 2019-07-21 ENCOUNTER — Other Ambulatory Visit: Payer: Self-pay

## 2019-07-21 ENCOUNTER — Other Ambulatory Visit: Payer: Self-pay | Admitting: *Deleted

## 2019-07-21 DIAGNOSIS — E669 Obesity, unspecified: Secondary | ICD-10-CM

## 2019-07-21 DIAGNOSIS — Z5321 Procedure and treatment not carried out due to patient leaving prior to being seen by health care provider: Secondary | ICD-10-CM | POA: Diagnosis not present

## 2019-07-21 DIAGNOSIS — R52 Pain, unspecified: Secondary | ICD-10-CM

## 2019-07-21 DIAGNOSIS — Z3A27 27 weeks gestation of pregnancy: Secondary | ICD-10-CM

## 2019-07-21 DIAGNOSIS — O10919 Unspecified pre-existing hypertension complicating pregnancy, unspecified trimester: Secondary | ICD-10-CM

## 2019-07-21 DIAGNOSIS — M79672 Pain in left foot: Secondary | ICD-10-CM | POA: Insufficient documentation

## 2019-07-21 DIAGNOSIS — Z362 Encounter for other antenatal screening follow-up: Secondary | ICD-10-CM

## 2019-07-21 DIAGNOSIS — O10012 Pre-existing essential hypertension complicating pregnancy, second trimester: Secondary | ICD-10-CM | POA: Diagnosis not present

## 2019-07-21 DIAGNOSIS — O99212 Obesity complicating pregnancy, second trimester: Secondary | ICD-10-CM

## 2019-07-21 DIAGNOSIS — O09522 Supervision of elderly multigravida, second trimester: Secondary | ICD-10-CM | POA: Insufficient documentation

## 2019-07-21 DIAGNOSIS — O09892 Supervision of other high risk pregnancies, second trimester: Secondary | ICD-10-CM | POA: Diagnosis not present

## 2019-07-21 DIAGNOSIS — Z3490 Encounter for supervision of normal pregnancy, unspecified, unspecified trimester: Secondary | ICD-10-CM

## 2019-07-21 DIAGNOSIS — O10912 Unspecified pre-existing hypertension complicating pregnancy, second trimester: Secondary | ICD-10-CM | POA: Insufficient documentation

## 2019-07-21 NOTE — ED Triage Notes (Signed)
Pt here for eval of pain to medial aspect of L foot since yesterday. Doesn't know exactly what she did, but pain worsening, hard to bear weight now.

## 2019-07-21 NOTE — ED Provider Notes (Signed)
[facility]   MRN: [id number] DOB: May 08, 1985  Subjective:   Victoria Robinson is a 34 y.o. female presenting for acute onset of left foot pain that started last night without any known aggravating factors.  States that she heard a pop last night when she was putting her child into the car.  Patient is pregnant and cannot take NSAIDs.  She has used Tylenol intermittently.  She went to the ER today, got an x-ray and left before being seen.  No current facility-administered medications for this encounter.  Current Outpatient Medications:  .  acetaminophen (TYLENOL) 325 MG tablet, Take 650 mg by mouth every 6 (six) hours as needed for moderate pain., Disp: , Rfl:  .  aspirin EC 81 MG tablet, Take 1 tablet (81 mg total) by mouth daily. Take after 12 weeks for prevention of preeclampsia later in pregnancy, Disp: 300 tablet, Rfl: 2 .  labetalol (NORMODYNE) 200 MG tablet, Take 1 tablet (200 mg total) by mouth 2 (two) times daily., Disp: 60 tablet, Rfl: 3 .  Prenatal Vit w/Fe-Methylfol-FA (PNV PO), Take 1 tablet by mouth daily. , Disp: , Rfl:    No Known Allergies  Past Medical History:  Diagnosis Date  . Abnormal Pap smear    repeat WNL  . Asthma childhood  . Shortness of breath      Past Surgical History:  Procedure Laterality Date  . APPENDECTOMY      Family History  Problem Relation Age of Onset  . Hyperlipidemia Mother   . Hyperlipidemia Father   . Hypertension Father   . Diabetes Maternal Grandmother   . Diabetes Paternal Grandmother     Social History   Tobacco Use  . Smoking status: Former Smoker    Packs/day: 0.50    Types: Cigarettes  . Smokeless tobacco: Never Used  Vaping Use  . Vaping Use: Never used  Substance Use Topics  . Alcohol use: Not Currently    Comment: occasionally   . Drug use: No    ROS   Objective:   Vitals: BP (!) 143/82   Pulse 83   Temp 97.9 F (36.6 C) (Oral)   Resp 16   Ht 5\' 1"  (1.549 m)   Wt 270 lb (122.5 kg)    LMP 01/11/2019   SpO2 98%   BMI 51.02 kg/m   Physical Exam Constitutional:      General: She is not in acute distress.    Appearance: Normal appearance. She is well-developed. She is not ill-appearing, toxic-appearing or diaphoretic.  HENT:     Head: Normocephalic and atraumatic.     Nose: Nose normal.     Mouth/Throat:     Mouth: Mucous membranes are moist.     Pharynx: Oropharynx is clear.  Eyes:     General: No scleral icterus.       Right eye: No discharge.        Left eye: No discharge.     Extraocular Movements: Extraocular movements intact.     Pupils: Pupils are equal, round, and reactive to light.  Cardiovascular:     Rate and Rhythm: Normal rate.  Pulmonary:     Effort: Pulmonary effort is normal.  Musculoskeletal:     Left foot: Decreased range of motion (inversion). Normal capillary refill. Tenderness (arch, mid foot over dorsal surface) present. No swelling, deformity, bunion, bony tenderness or crepitus.  Skin:    General: Skin is warm and dry.  Neurological:     General: No focal  deficit present.     Mental Status: She is alert and oriented to person, place, and time.  Psychiatric:        Mood and Affect: Mood normal.        Behavior: Behavior normal.        Thought Content: Thought content normal.        Judgment: Judgment normal.     DG Foot Complete Left  Result Date: 07/21/2019 CLINICAL DATA:  Swelling and difficulty weight-bearing EXAM: LEFT FOOT - COMPLETE 3+ VIEW COMPARISON:  September 09, 2018 FINDINGS: Frontal, oblique, and lateral views were obtained. There is no evident fracture or dislocation. The joint spaces appear normal. No erosive change. There are posteroinferior calcaneal spurs. IMPRESSION: Calcaneal spurs. No fracture or dislocation. No evident arthropathy. Electronically Signed   By: Lowella Grip III M.D.   On: 07/21/2019 14:29   [place] MFM OB FOLLOW UP  Result Date:  07/21/2019 ----------------------------------------------------------------------  OBSTETRICS REPORT                       (Signed Final 07/21/2019 12:55 pm) ---------------------------------------------------------------------- Patient Info  ID #:       [id number]                          D.O.B.:  08-Aug-1985 (33 yrs)  Name:       Victoria Robinson              Visit Date: 07/21/2019 11:37 am ---------------------------------------------------------------------- Performed By  Attending:        Tama High MD        Ref. Address:     [place] [place]                                                             [place]  Performed By:     Jacob Moores BS,       [facility]T  Fetal Care  Referred By:      [facility] ---------------------------------------------------------------------- Orders  #  Description                           Code        Ordered By  1  [place] MFM OB FOLLOW UP                   02637.85    RAVI SHANKAR ----------------------------------------------------------------------  #  Order #                     Accession #                Episode #  1  [id number]                   [id number]                 [id number] ---------------------------------------------------------------------- Indications  [redacted] weeks gestation of pregnancy                Z3A.27  Hypertension - Chronic/Pre-existing            O10.019  (labetalol)  Advanced maternal age multigravida 49+,        O41.522  second trimester (35 by delivery)  Obesity complicating pregnancy, second         O99.212  trimester (pregravid BMI 50)  Short interval between pregancies, 2nd         O09.892  trimester  Encounter for other antenatal screening        Z36.2  follow-up  ---------------------------------------------------------------------- Fetal Evaluation  Num Of Fetuses:         1  Fetal Heart Rate(bpm):  129  Cardiac Activity:       Observed  Presentation:           Variable  Placenta:               Anterior  P. Cord Insertion:      Previously Visualized  Amniotic Fluid  AFI FV:      Within normal limits                              Largest Pocket(cm)                              5.1 ---------------------------------------------------------------------- Biometry  BPD:      69.9  mm     G. Age:  28w 1d         66  %    CI:        76.13   %    70 - 86                                                          FL/HC:      19.5   %    18.6 - 20.4  HC:      253.9  mm     G. Age:  27w 4d         30  %    HC/AC:      1.11  1.05 - 1.21  AC:      228.9  mm     G. Age:  27w 2d         41  %    FL/BPD:     71.0   %    71 - 87  FL:       49.6  mm     G. Age:  26w 5d         20  %    FL/AC:      21.7   %    20 - 24  LV:        3.5  mm  Est. FW:    1035  gm      2 lb 5 oz     33  % ---------------------------------------------------------------------- OB History  Gravidity:    5         Term:   3        Prem:   0        SAB:   1  TOP:          0       Ectopic:  0        Living: 3 ---------------------------------------------------------------------- Gestational Age  LMP:           27w 2d        Date:  01/11/19                 EDD:   10/18/19  U/S Today:     27w 3d                                        EDD:   10/17/19  Best:          27w 2d     Det. By:  LMP  (01/11/19)          EDD:   10/18/19 ---------------------------------------------------------------------- Anatomy  Cranium:               Previously seen        Aortic Arch:            Previously seen  Cavum:                 Previously seen        Ductal Arch:            Previously seen  Ventricles:            Appears normal         Diaphragm:              Appears normal  Choroid Plexus:        Previously seen        Stomach:                 Appears normal, left                                                                        sided  Cerebellum:            Previously seen  Abdomen:                Previously seen  Posterior Fossa:       Previously seen        Abdominal Wall:         Previously seen  Nuchal Fold:           Not applicable (>20    Cord Vessels:           Previously seen                         wks GA)  Face:                  Orbits and profile     Kidneys:                Appear normal                         previously seen  Lips:                  Previously seen        Bladder:                Appears normal  Thoracic:              Appears normal         Spine:                  Previously seen                         Previously seen  Heart:                 Previously seen        Upper Extremities:      Previously seen  RVOT:                  Previously seen        Lower Extremities:      Previously seen  LVOT:                  Previously seen  Other:  Heels/feet and 5th digits visualized previously. Nasal bone visualized          previously. Fetus appears to be female. Technically difficult due to          maternal habitus and fetal position. ---------------------------------------------------------------------- Cervix Uterus Adnexa  Cervix  Length:           4.72  cm.  Normal appearance by transabdominal scan.  Uterus  No abnormality visualized.  Right Ovary  Within normal limits. No adnexal mass visualized.  Left Ovary  Within normal limits. No adnexal mass visualized.  Cul De Sac  No free fluid seen.  Adnexa  No abnormality visualized. ---------------------------------------------------------------------- Impression  Chronic hypertension. Well-controlled on labetalol .BP at our  office: 120/77 mm Hg.  Amniotic fluid is normal and good fetal activity is seen .Fetal  growth is appropriate for gestational age .  We reassured the patient of the findings.  ---------------------------------------------------------------------- Recommendations  -An appointment was made for her to return in 5 weeks for  fetal growth assessment.  -Weekly BPP from her next visit (32 weeks) till delivery. ----------------------------------------------------------------------                  Noralee Space, MD Electronically  Signed Final Report   07/21/2019 12:55 pm ----------------------------------------------------------------------   Assessment and Plan :   PDMP not reviewed this encounter.  1. Left foot pain   2. Pregnancy, unspecified gestational age     Use Tylenol for pain, x-ray negative apart from posterior inferior calcaneal spurs which I do not suspect are causing the symptoms.  Counseled patient to follow-up with her obstetrician to see if it would be okay for her to use diclofenac gel.  Otherwise follow-up with podiatrist. Counseled patient on potential for adverse effects with medications prescribed/recommended today, ER and return-to-clinic precautions discussed, patient verbalized understanding.    Wallis BambergMani, Nimrod Wendt, PA-C 07/21/19 1714

## 2019-07-21 NOTE — Discharge Instructions (Addendum)
Please just use Tylenol at a dose of 500mg-650mg once every 6 hours as needed for your aches, pains, fevers. Do not use any nonsteroidal anti-inflammatories (NSAIDs) like ibuprofen, Motrin, naproxen, Aleve, etc. which are all available over-the-counter.   

## 2019-07-21 NOTE — ED Triage Notes (Signed)
Pt states she thinks she hyper extended her left foot at work and when she was putting her child into the car. Pt c/o 7/10 stabbing pain in left foot. Pt states she felt a "pop" in her left foot last night when she was putting her child into the vehicle. Pt limped to the exam room.

## 2019-07-22 ENCOUNTER — Other Ambulatory Visit: Payer: Self-pay | Admitting: *Deleted

## 2019-07-22 DIAGNOSIS — O10919 Unspecified pre-existing hypertension complicating pregnancy, unspecified trimester: Secondary | ICD-10-CM

## 2019-07-26 ENCOUNTER — Other Ambulatory Visit: Payer: BC Managed Care – PPO

## 2019-07-26 ENCOUNTER — Other Ambulatory Visit: Payer: Self-pay

## 2019-07-26 ENCOUNTER — Ambulatory Visit (INDEPENDENT_AMBULATORY_CARE_PROVIDER_SITE_OTHER): Payer: BC Managed Care – PPO | Admitting: Obstetrics and Gynecology

## 2019-07-26 VITALS — BP 116/77 | HR 89 | Wt 280.0 lb

## 2019-07-26 DIAGNOSIS — Z3480 Encounter for supervision of other normal pregnancy, unspecified trimester: Secondary | ICD-10-CM

## 2019-07-26 DIAGNOSIS — Z3A28 28 weeks gestation of pregnancy: Secondary | ICD-10-CM

## 2019-07-26 DIAGNOSIS — Z23 Encounter for immunization: Secondary | ICD-10-CM

## 2019-07-26 DIAGNOSIS — O10913 Unspecified pre-existing hypertension complicating pregnancy, third trimester: Secondary | ICD-10-CM

## 2019-07-26 DIAGNOSIS — O10919 Unspecified pre-existing hypertension complicating pregnancy, unspecified trimester: Secondary | ICD-10-CM

## 2019-07-26 DIAGNOSIS — O219 Vomiting of pregnancy, unspecified: Secondary | ICD-10-CM

## 2019-07-26 MED ORDER — ONDANSETRON 4 MG PO TBDP: 4.0000 mg | ORAL_TABLET | Freq: Four times a day (QID) | ORAL | 0 refills | Status: DC | PRN

## 2019-07-26 NOTE — Patient Instructions (Signed)

## 2019-07-26 NOTE — Progress Notes (Signed)
   PRENATAL VISIT NOTE  Subjective:  Victoria Robinson is a 34 y.o. Y3K1601 at [redacted]w[redacted]d being seen today for ongoing prenatal care.  She is currently monitored for the following issues for this high-risk pregnancy and has Obesity; History of herpes genitalis; Short interval between pregnancies affecting pregnancy, antepartum; History of gestational hypertension; Supervision of other normal pregnancy, antepartum; Chronic hypertension during pregnancy, antepartum; and AMA (advanced maternal age) multigravida 35+ on their problem list.  Patient doing well with no acute concerns today. She reports nausea related to glucose testing.  Contractions: Irritability. Vag. Bleeding: None.  Movement: Present. Denies leaking of fluid.   Pt did note sporadic headache and visual spots, but none today.  The following portions of the patient's history were reviewed and updated as appropriate: allergies, current medications, past family history, past medical history, past social history, past surgical history and problem list. Problem list updated.  Objective:   Vitals:   07/26/19 0857  BP: 116/77  Pulse: 89  Weight: 280 lb (127 kg)    Fetal Status: Fetal Heart Rate (bpm): 145   Movement: Present     General:  Alert, oriented and cooperative. Patient is in no acute distress.  Skin: Skin is warm and dry. No rash noted.   Cardiovascular: Normal heart rate noted  Respiratory: Normal respiratory effort, no problems with respiration noted  Abdomen: Soft, gravid, appropriate for gestational age.  Pain/Pressure: Present     Pelvic: Cervical exam deferred        Extremities: Normal range of motion.  Edema: None  Mental Status:  Normal mood and affect. Normal behavior. Normal judgment and thought content.   Assessment and Plan:  Pregnancy: G5P3013 at [redacted]w[redacted]d  1. Supervision of other normal pregnancy, antepartum  - CBC - Glucose Tolerance, 2 Hours w/1 Hour, pt did not tolerate solution and vomited, will repeat  next week, rx for zofran given - RPR - HIV Antibody (routine testing w rflx) - Tdap vaccine greater than or equal to 7yo IM  2. Nausea/vomiting in pregnancy  - ondansetron (ZOFRAN ODT) 4 MG disintegrating tablet; Take 1 tablet (4 mg total) by mouth every 6 (six) hours as needed for nausea.  Dispense: 6 tablet; Refill: 0  Preterm labor symptoms and general obstetric precautions including but not limited to vaginal bleeding, contractions, leaking of fluid and fetal movement were reviewed in detail with the patient.  Please refer to After Visit Summary for other counseling recommendations.   Return in about 2 weeks (around 08/09/2019) for [facility].  Mariel Aloe, MD

## 2019-07-26 NOTE — Progress Notes (Signed)
Pt is here for ROB, [redacted]w[redacted]d. Pt was unable to complete GTT today, will come back Monday for lab only appointment.

## 2019-07-27 LAB — HIV ANTIBODY (ROUTINE TESTING W REFLEX): HIV Screen 4th Generation wRfx: NONREACTIVE

## 2019-07-27 LAB — CBC
Hematocrit: 34.3 % (ref 34.0–46.6)
Hemoglobin: 11.7 g/dL (ref 11.1–15.9)
MCH: 30.8 pg (ref 26.6–33.0)
MCHC: 34.1 g/dL (ref 31.5–35.7)
MCV: 90 fL (ref 79–97)
Platelets: 239 10*3/uL (ref 150–450)
RBC: 3.8 x10E6/uL (ref 3.77–5.28)
RDW: 12.8 % (ref 11.7–15.4)
WBC: 8.7 10*3/uL (ref 3.4–10.8)

## 2019-07-27 LAB — RPR: RPR Ser Ql: NONREACTIVE

## 2019-08-01 ENCOUNTER — Other Ambulatory Visit: Payer: BC Managed Care – PPO

## 2019-08-01 ENCOUNTER — Other Ambulatory Visit: Payer: Self-pay

## 2019-08-01 DIAGNOSIS — Z3480 Encounter for supervision of other normal pregnancy, unspecified trimester: Secondary | ICD-10-CM

## 2019-08-02 ENCOUNTER — Ambulatory Visit: Payer: BC Managed Care – PPO

## 2019-08-02 LAB — CBC
Hematocrit: 33.7 % — ABNORMAL LOW (ref 34.0–46.6)
Hemoglobin: 11.4 g/dL (ref 11.1–15.9)
MCH: 31 pg (ref 26.6–33.0)
MCHC: 33.8 g/dL (ref 31.5–35.7)
MCV: 92 fL (ref 79–97)
Platelets: 167 10*3/uL (ref 150–450)
RBC: 3.68 x10E6/uL — ABNORMAL LOW (ref 3.77–5.28)
RDW: 12.7 % (ref 11.7–15.4)
WBC: 8.3 10*3/uL (ref 3.4–10.8)

## 2019-08-02 LAB — GLUCOSE TOLERANCE, 2 HOURS W/ 1HR
Glucose, 1 hour: 141 mg/dL (ref 65–179)
Glucose, 2 hour: 105 mg/dL (ref 65–152)
Glucose, Fasting: 85 mg/dL (ref 65–91)

## 2019-08-02 LAB — HIV ANTIBODY (ROUTINE TESTING W REFLEX): HIV Screen 4th Generation wRfx: NONREACTIVE

## 2019-08-02 LAB — RPR: RPR Ser Ql: NONREACTIVE

## 2019-08-09 ENCOUNTER — Other Ambulatory Visit: Payer: Self-pay

## 2019-08-09 ENCOUNTER — Ambulatory Visit (INDEPENDENT_AMBULATORY_CARE_PROVIDER_SITE_OTHER): Payer: BC Managed Care – PPO | Admitting: Obstetrics and Gynecology

## 2019-08-09 ENCOUNTER — Encounter: Payer: Self-pay | Admitting: Obstetrics and Gynecology

## 2019-08-09 VITALS — BP 112/72 | HR 84 | Wt 285.6 lb

## 2019-08-09 DIAGNOSIS — Z8759 Personal history of other complications of pregnancy, childbirth and the puerperium: Secondary | ICD-10-CM

## 2019-08-09 DIAGNOSIS — E669 Obesity, unspecified: Secondary | ICD-10-CM

## 2019-08-09 DIAGNOSIS — Z8619 Personal history of other infectious and parasitic diseases: Secondary | ICD-10-CM

## 2019-08-09 DIAGNOSIS — O0993 Supervision of high risk pregnancy, unspecified, third trimester: Secondary | ICD-10-CM

## 2019-08-09 DIAGNOSIS — O99213 Obesity complicating pregnancy, third trimester: Secondary | ICD-10-CM

## 2019-08-09 DIAGNOSIS — O10913 Unspecified pre-existing hypertension complicating pregnancy, third trimester: Secondary | ICD-10-CM

## 2019-08-09 DIAGNOSIS — Z3A30 30 weeks gestation of pregnancy: Secondary | ICD-10-CM

## 2019-08-09 DIAGNOSIS — O10919 Unspecified pre-existing hypertension complicating pregnancy, unspecified trimester: Secondary | ICD-10-CM

## 2019-08-09 MED ORDER — VALACYCLOVIR HCL 500 MG PO TABS
500.0000 mg | ORAL_TABLET | Freq: Every day | ORAL | 0 refills | Status: DC
Start: 2019-08-09 — End: 2019-09-12

## 2019-08-09 NOTE — Progress Notes (Signed)
   PRENATAL VISIT NOTE  Subjective:  Victoria Robinson is a 34 y.o. [id number] at [redacted]w[redacted]d being seen today for ongoing prenatal care.  She is currently monitored for the following issues for this low-risk pregnancy and has Obesity; History of herpes genitalis; Short interval between pregnancies affecting pregnancy, antepartum; History of gestational hypertension; Supervision of other normal pregnancy, antepartum; Chronic hypertension during pregnancy, antepartum; and AMA (advanced maternal age) multigravida 35+ on their problem list.  Patient reports occasional contractions.  Contractions: Irritability. Vag. Bleeding: None.  Movement: Present. Denies leaking of fluid.   The following portions of the patient's history were reviewed and updated as appropriate: allergies, current medications, past family history, past medical history, past social history, past surgical history and problem list.   Objective:   Vitals:   08/09/19 1526  BP: 112/72  Pulse: 84  Weight: 129.5 kg    Fetal Status: Fetal Heart Rate (bpm): 145 Fundal Height: 31 cm Movement: Present     General:  Alert, oriented and cooperative. Patient is in no acute distress.  Skin: Skin is warm and dry. No rash noted.   Cardiovascular: Normal heart rate noted  Respiratory: Normal respiratory effort, no problems with respiration noted  Abdomen: Soft, gravid, appropriate for gestational age.  Pain/Pressure: Absent     Pelvic: Cervical exam deferred        Extremities: Normal range of motion.  Edema: Trace  Mental Status: Normal mood and affect. Normal behavior. Normal judgment and thought content.   Assessment and Plan:  Pregnancy: [id number] at [redacted]w[redacted]d 1. Supervision of high risk pregnancy in third trimester   2. Chronic hypertension during pregnancy, antepartum Continue current dose of labetalol. Serial growth scans and antenatal testing to start at 32 weeks.  3. History of herpes genitalis Rx for valtrex given. Suppressive  therapy discussed.  Preterm labor symptoms and general obstetric precautions including but not limited to vaginal bleeding, contractions, leaking of fluid and fetal movement were reviewed in detail with the patient. Please refer to After Visit Summary for other counseling recommendations.   Return in about 2 weeks (around 08/23/2019) for HROB with NST.  Future Appointments  Date Time Provider Department Center  08/18/2019  9:00 AM Little Ishikawa, MD [facility]  08/25/2019  3:45 PM [facility] NURSE [facility]  08/25/2019  3:45 PM [facility] US4 [facility]  09/01/2019  3:45 PM [facility]URSE [facility]  09/01/2019  3:45 PM [facility] US4 [facility]  09/08/2019  3:45 PM [facility] NURSE [facility]  09/08/2019  3:45 PM [facility] US4 [facility]    Johnny Bridge, MD

## 2019-08-09 NOTE — Progress Notes (Signed)
Pt is here for ROB, [redacted]w[redacted]d.

## 2019-08-18 ENCOUNTER — Telehealth: Payer: Self-pay

## 2019-08-18 ENCOUNTER — Telehealth: Payer: BC Managed Care – PPO | Admitting: Cardiology

## 2019-08-18 NOTE — Telephone Encounter (Signed)
Left a voice message for the patient informing her that I was calling to get her prepared for her virtual visit with Dr. Bjorn Pippin and to give our office a call.

## 2019-08-25 ENCOUNTER — Other Ambulatory Visit: Payer: Self-pay

## 2019-08-25 ENCOUNTER — Ambulatory Visit: Payer: BC Managed Care – PPO | Attending: Obstetrics and Gynecology

## 2019-08-25 ENCOUNTER — Ambulatory Visit: Payer: BC Managed Care – PPO | Admitting: *Deleted

## 2019-08-25 DIAGNOSIS — O09522 Supervision of elderly multigravida, second trimester: Secondary | ICD-10-CM | POA: Diagnosis present

## 2019-08-25 DIAGNOSIS — Z362 Encounter for other antenatal screening follow-up: Secondary | ICD-10-CM

## 2019-08-25 DIAGNOSIS — O10919 Unspecified pre-existing hypertension complicating pregnancy, unspecified trimester: Secondary | ICD-10-CM | POA: Insufficient documentation

## 2019-08-25 DIAGNOSIS — O09523 Supervision of elderly multigravida, third trimester: Secondary | ICD-10-CM | POA: Diagnosis not present

## 2019-08-25 DIAGNOSIS — O10013 Pre-existing essential hypertension complicating pregnancy, third trimester: Secondary | ICD-10-CM

## 2019-08-25 DIAGNOSIS — O09893 Supervision of other high risk pregnancies, third trimester: Secondary | ICD-10-CM | POA: Diagnosis not present

## 2019-08-25 DIAGNOSIS — E669 Obesity, unspecified: Secondary | ICD-10-CM

## 2019-08-25 DIAGNOSIS — O99213 Obesity complicating pregnancy, third trimester: Secondary | ICD-10-CM

## 2019-08-25 DIAGNOSIS — Z3A32 32 weeks gestation of pregnancy: Secondary | ICD-10-CM

## 2019-08-29 ENCOUNTER — Encounter: Payer: BC Managed Care – PPO | Admitting: Obstetrics

## 2019-08-29 ENCOUNTER — Other Ambulatory Visit: Payer: Self-pay

## 2019-08-29 ENCOUNTER — Ambulatory Visit (INDEPENDENT_AMBULATORY_CARE_PROVIDER_SITE_OTHER): Payer: BC Managed Care – PPO | Admitting: Obstetrics

## 2019-08-29 ENCOUNTER — Encounter: Payer: Self-pay | Admitting: Obstetrics

## 2019-08-29 VITALS — BP 132/88 | HR 92 | Wt 283.1 lb

## 2019-08-29 DIAGNOSIS — O10913 Unspecified pre-existing hypertension complicating pregnancy, third trimester: Secondary | ICD-10-CM

## 2019-08-29 DIAGNOSIS — Z3A32 32 weeks gestation of pregnancy: Secondary | ICD-10-CM

## 2019-08-29 DIAGNOSIS — O0993 Supervision of high risk pregnancy, unspecified, third trimester: Secondary | ICD-10-CM

## 2019-08-29 DIAGNOSIS — O09899 Supervision of other high risk pregnancies, unspecified trimester: Secondary | ICD-10-CM

## 2019-08-29 DIAGNOSIS — E669 Obesity, unspecified: Secondary | ICD-10-CM

## 2019-08-29 DIAGNOSIS — O99210 Obesity complicating pregnancy, unspecified trimester: Secondary | ICD-10-CM

## 2019-08-29 DIAGNOSIS — O99213 Obesity complicating pregnancy, third trimester: Secondary | ICD-10-CM

## 2019-08-29 DIAGNOSIS — O10919 Unspecified pre-existing hypertension complicating pregnancy, unspecified trimester: Secondary | ICD-10-CM

## 2019-08-29 NOTE — Progress Notes (Signed)
ROB   CC: Pain in right side. Pt believes is ligament pain. Pt notes to take Tylenol for relief.   NST Today.

## 2019-08-29 NOTE — Progress Notes (Signed)
Subjective:  Victoria Robinson is a 34 y.o. [id number] at [redacted]w[redacted]d being seen today for ongoing prenatal care.  She is currently monitored for the following issues for this high-risk pregnancy and has Obesity; History of herpes genitalis; Short interval between pregnancies affecting pregnancy, antepartum; History of gestational hypertension; Supervision of other normal pregnancy, antepartum; Chronic hypertension during pregnancy, antepartum; and AMA (advanced maternal age) multigravida 35+ on their problem list.  Patient reports occasional contractions.  Contractions: Irritability. Vag. Bleeding: None.  Movement: Present. Denies leaking of fluid.   The following portions of the patient's history were reviewed and updated as appropriate: allergies, current medications, past family history, past medical history, past social history, past surgical history and problem list. Problem list updated.  Objective:   Vitals:   08/29/19 0834  BP: (!) 132/88  Pulse: 92  Weight: (!) 283 lb 1.6 oz (128.4 kg)    Fetal Status:     Movement: Present     General:  Alert, oriented and cooperative. Patient is in no acute distress.  Skin: Skin is warm and dry. No rash noted.   Cardiovascular: Normal heart rate noted  Respiratory: Normal respiratory effort, no problems with respiration noted  Abdomen: Soft, gravid, appropriate for gestational age. Pain/Pressure: Absent     Pelvic:  Cervical exam deferred        Extremities: Normal range of motion.  Edema: Trace  Mental Status: Normal mood and affect. Normal behavior. Normal judgment and thought content.   Urinalysis:      Assessment and Plan:  Pregnancy: [id number] at [redacted]w[redacted]d  1. Supervision of high risk pregnancy in third trimester  2. Short interval between pregnancies affecting pregnancy, antepartum  3. Chronic hypertension during pregnancy, antepartum - weekly BPP with U/A Dopplers  4. Obesity affecting pregnancy, antepartum   Preterm labor symptoms and  general obstetric precautions including but not limited to vaginal bleeding, contractions, leaking of fluid and fetal movement were reviewed in detail with the patient. Please refer to After Visit Summary for other counseling recommendations.   Return in about 2 weeks (around 09/12/2019) for MyChart [facility] Only.   Brock Bad, MD  08/29/19

## 2019-08-30 NOTE — Addendum Note (Signed)
Addended by: Kennon Portela on: 08/30/2019 02:28 PM   Modules accepted: Orders

## 2019-09-01 ENCOUNTER — Other Ambulatory Visit: Payer: BC Managed Care – PPO

## 2019-09-01 ENCOUNTER — Other Ambulatory Visit: Payer: Self-pay

## 2019-09-01 ENCOUNTER — Ambulatory Visit: Payer: BC Managed Care – PPO | Admitting: *Deleted

## 2019-09-01 ENCOUNTER — Ambulatory Visit: Payer: BC Managed Care – PPO | Attending: Obstetrics and Gynecology

## 2019-09-01 ENCOUNTER — Ambulatory Visit: Payer: BC Managed Care – PPO

## 2019-09-01 DIAGNOSIS — O10013 Pre-existing essential hypertension complicating pregnancy, third trimester: Secondary | ICD-10-CM

## 2019-09-01 DIAGNOSIS — O09522 Supervision of elderly multigravida, second trimester: Secondary | ICD-10-CM | POA: Diagnosis not present

## 2019-09-01 DIAGNOSIS — O09523 Supervision of elderly multigravida, third trimester: Secondary | ICD-10-CM

## 2019-09-01 DIAGNOSIS — Z3A33 33 weeks gestation of pregnancy: Secondary | ICD-10-CM

## 2019-09-01 DIAGNOSIS — O09893 Supervision of other high risk pregnancies, third trimester: Secondary | ICD-10-CM

## 2019-09-01 DIAGNOSIS — E669 Obesity, unspecified: Secondary | ICD-10-CM

## 2019-09-01 DIAGNOSIS — O10919 Unspecified pre-existing hypertension complicating pregnancy, unspecified trimester: Secondary | ICD-10-CM

## 2019-09-01 DIAGNOSIS — O99213 Obesity complicating pregnancy, third trimester: Secondary | ICD-10-CM

## 2019-09-01 DIAGNOSIS — Z362 Encounter for other antenatal screening follow-up: Secondary | ICD-10-CM | POA: Diagnosis not present

## 2019-09-05 DIAGNOSIS — Z3493 Encounter for supervision of normal pregnancy, unspecified, third trimester: Secondary | ICD-10-CM

## 2019-09-07 ENCOUNTER — Inpatient Hospital Stay (HOSPITAL_COMMUNITY)
Admission: AD | Admit: 2019-09-07 | Discharge: 2019-09-07 | Disposition: A | Discharge status: Home / Self Care | Payer: BC Managed Care – PPO | Attending: Family Medicine | Admitting: Family Medicine

## 2019-09-07 ENCOUNTER — Encounter (HOSPITAL_COMMUNITY): Payer: Self-pay | Admitting: Family Medicine

## 2019-09-07 ENCOUNTER — Other Ambulatory Visit: Payer: Self-pay

## 2019-09-07 DIAGNOSIS — Z7982 Long term (current) use of aspirin: Secondary | ICD-10-CM | POA: Insufficient documentation

## 2019-09-07 DIAGNOSIS — R42 Dizziness and giddiness: Secondary | ICD-10-CM

## 2019-09-07 DIAGNOSIS — Z87891 Personal history of nicotine dependence: Secondary | ICD-10-CM | POA: Diagnosis not present

## 2019-09-07 DIAGNOSIS — Z8249 Family history of ischemic heart disease and other diseases of the circulatory system: Secondary | ICD-10-CM | POA: Diagnosis not present

## 2019-09-07 DIAGNOSIS — Z79899 Other long term (current) drug therapy: Secondary | ICD-10-CM | POA: Insufficient documentation

## 2019-09-07 DIAGNOSIS — O09523 Supervision of elderly multigravida, third trimester: Secondary | ICD-10-CM | POA: Diagnosis not present

## 2019-09-07 DIAGNOSIS — Z3A34 34 weeks gestation of pregnancy: Secondary | ICD-10-CM | POA: Insufficient documentation

## 2019-09-07 DIAGNOSIS — H538 Other visual disturbances: Secondary | ICD-10-CM | POA: Insufficient documentation

## 2019-09-07 DIAGNOSIS — O10919 Unspecified pre-existing hypertension complicating pregnancy, unspecified trimester: Secondary | ICD-10-CM

## 2019-09-07 DIAGNOSIS — O10913 Unspecified pre-existing hypertension complicating pregnancy, third trimester: Secondary | ICD-10-CM | POA: Insufficient documentation

## 2019-09-07 DIAGNOSIS — O26893 Other specified pregnancy related conditions, third trimester: Secondary | ICD-10-CM | POA: Insufficient documentation

## 2019-09-07 LAB — PROTEIN / CREATININE RATIO, URINE
Creatinine, Urine: 184.99 mg/dL
Protein Creatinine Ratio: 0.1 mg/mg{Cre} (ref 0.00–0.15)
Total Protein, Urine: 19 mg/dL

## 2019-09-07 LAB — COMPREHENSIVE METABOLIC PANEL
ALT: 15 U/L (ref 0–44)
AST: 14 U/L — ABNORMAL LOW (ref 15–41)
Albumin: 2.9 g/dL — ABNORMAL LOW (ref 3.5–5.0)
Alkaline Phosphatase: 58 U/L (ref 38–126)
Anion gap: 7 (ref 5–15)
BUN: 5 mg/dL — ABNORMAL LOW (ref 6–20)
CO2: 23 mmol/L (ref 22–32)
Calcium: 8.9 mg/dL (ref 8.9–10.3)
Chloride: 105 mmol/L (ref 98–111)
Creatinine, Ser: 0.65 mg/dL (ref 0.44–1.00)
GFR calc Af Amer: >60 mL/min (ref >60)
GFR calc non Af Amer: >60 mL/min (ref >60)
Glucose, Bld: 97 mg/dL (ref 70–99)
Potassium: 3.9 mmol/L (ref 3.5–5.1)
Sodium: 135 mmol/L (ref 135–145)
Total Bilirubin: 0.5 mg/dL (ref 0.3–1.2)
Total Protein: 6.1 g/dL — ABNORMAL LOW (ref 6.5–8.1)

## 2019-09-07 LAB — URINALYSIS, ROUTINE W REFLEX MICROSCOPIC
Bilirubin Urine: NEGATIVE
Glucose, UA: NEGATIVE mg/dL
Hgb urine dipstick: NEGATIVE
Ketones, ur: NEGATIVE mg/dL
Leukocytes,Ua: NEGATIVE
Nitrite: NEGATIVE
Protein, ur: NEGATIVE mg/dL
Specific Gravity, Urine: 1.019 (ref 1.005–1.030)
pH: 6 (ref 5.0–8.0)

## 2019-09-07 LAB — CBC WITH DIFFERENTIAL/PLATELET
Abs Immature Granulocytes: 0.03 10*3/uL (ref 0.00–0.07)
Basophils Absolute: 0 10*3/uL (ref 0.0–0.1)
Basophils Relative: 0 %
Eosinophils Absolute: 0.1 10*3/uL (ref 0.0–0.5)
Eosinophils Relative: 1 %
HCT: 34.7 % — ABNORMAL LOW (ref 36.0–46.0)
Hemoglobin: 11.2 g/dL — ABNORMAL LOW (ref 12.0–15.0)
Immature Granulocytes: 0 %
Lymphocytes Relative: 16 %
Lymphs Abs: 1.4 10*3/uL (ref 0.7–4.0)
MCH: 30.4 pg (ref 26.0–34.0)
MCHC: 32.3 g/dL (ref 30.0–36.0)
MCV: 94.3 fL (ref 80.0–100.0)
Monocytes Absolute: 0.6 10*3/uL (ref 0.1–1.0)
Monocytes Relative: 7 %
Neutro Abs: 6.4 10*3/uL (ref 1.7–7.7)
Neutrophils Relative %: 76 %
Platelets: 160 10*3/uL (ref 150–400)
RBC: 3.68 MIL/uL — ABNORMAL LOW (ref 3.87–5.11)
RDW: 13.2 % (ref 11.5–15.5)
WBC: 8.4 10*3/uL (ref 4.0–10.5)
nRBC: 0 % (ref 0.0–0.2)

## 2019-09-07 NOTE — Discharge Instructions (Signed)
Third Trimester of Pregnancy The third trimester is from week 28 through week 40 (months 7 through 9). The third trimester is a time when the unborn baby (fetus) is growing rapidly. At the end of the ninth month, the fetus is about 20 inches in length and weighs 6-10 pounds. Body changes during your third trimester Your body will continue to go through many changes during pregnancy. The changes vary from woman to woman. During the third trimester:  Your weight will continue to increase. You can expect to gain 25-35 pounds (11-16 kg) by the end of the pregnancy.  You may begin to get stretch marks on your hips, abdomen, and breasts.  You may urinate more often because the fetus is moving lower into your pelvis and pressing on your bladder.  You may develop or continue to have heartburn. This is caused by increased hormones that slow down muscles in the digestive tract.  You may develop or continue to have constipation because increased hormones slow digestion and cause the muscles that push waste through your intestines to relax.  You may develop hemorrhoids. These are swollen veins (varicose veins) in the rectum that can itch or be painful.  You may develop swollen, bulging veins (varicose veins) in your legs.  You may have increased body aches in the pelvis, back, or thighs. This is due to weight gain and increased hormones that are relaxing your joints.  You may have changes in your hair. These can include thickening of your hair, rapid growth, and changes in texture. Some women also have hair loss during or after pregnancy, or hair that feels dry or thin. Your hair will most likely return to normal after your baby is born.  Your breasts will continue to grow and they will continue to become tender. A yellow fluid (colostrum) may leak from your breasts. This is the first milk you are producing for your baby.  Your belly button may stick out.  You may notice more swelling in your hands,  face, or ankles.  You may have increased tingling or numbness in your hands, arms, and legs. The skin on your belly may also feel numb.  You may feel short of breath because of your expanding uterus.  You may have more problems sleeping. This can be caused by the size of your belly, increased need to urinate, and an increase in your body's metabolism.  You may notice the fetus "dropping," or moving lower in your abdomen (lightening).  You may have increased vaginal discharge.  You may notice your joints feel loose and you may have pain around your pelvic bone. What to expect at prenatal visits You will have prenatal exams every 2 weeks until week 36. Then you will have weekly prenatal exams. During a routine prenatal visit:  You will be weighed to make sure you and the baby are growing normally.  Your blood pressure will be taken.  Your abdomen will be measured to track your baby's growth.  The fetal heartbeat will be listened to.  Any test results from the previous visit will be discussed.  You may have a cervical check near your due date to see if your cervix has softened or thinned (effaced).  You will be tested for Group B streptococcus. This happens between 35 and 37 weeks. Your health care provider may ask you:  What your birth plan is.  How you are feeling.  If you are feeling the baby move.  If you have had any abnormal   symptoms, such as leaking fluid, bleeding, severe headaches, or abdominal cramping.  If you are using any tobacco products, including cigarettes, chewing tobacco, and electronic cigarettes.  If you have any questions. Other tests or screenings that may be performed during your third trimester include:  Blood tests that check for low iron levels (anemia).  Fetal testing to check the health, activity level, and growth of the fetus. Testing is done if you have certain medical conditions or if there are problems during the pregnancy.  Nonstress test  (NST). This test checks the health of your baby to make sure there are no signs of problems, such as the baby not getting enough oxygen. During this test, a belt is placed around your belly. The baby is made to move, and its heart rate is monitored during movement. What is false labor? False labor is a condition in which you feel small, irregular tightenings of the muscles in the womb (contractions) that usually go away with rest, changing position, or drinking water. These are called Braxton Hicks contractions. Contractions may last for hours, days, or even weeks before true labor sets in. If contractions come at regular intervals, become more frequent, increase in intensity, or become painful, you should see your health care provider. What are the signs of labor?  Abdominal cramps.  Regular contractions that start at 10 minutes apart and become stronger and more frequent with time.  Contractions that start on the top of the uterus and spread down to the lower abdomen and back.  Increased pelvic pressure and dull back pain.  A watery or bloody mucus discharge that comes from the vagina.  Leaking of amniotic fluid. This is also known as your "water breaking." It could be a slow trickle or a gush. Let your health care provider know if it has a color or strange odor. If you have any of these signs, call your health care provider right away, even if it is before your due date. Follow these instructions at home: Medicines  Follow your health care provider's instructions regarding medicine use. Specific medicines may be either safe or unsafe to take during pregnancy.  Take a prenatal vitamin that contains at least 600 micrograms (mcg) of folic acid.  If you develop constipation, try taking a stool softener if your health care provider approves. Eating and drinking   Eat a balanced diet that includes fresh fruits and vegetables, whole grains, good sources of protein such as meat, eggs, or tofu,  and low-fat dairy. Your health care provider will help you determine the amount of weight gain that is right for you.  Avoid raw meat and uncooked cheese. These carry germs that can cause birth defects in the baby.  If you have low calcium intake from food, talk to your health care provider about whether you should take a daily calcium supplement.  Eat four or five small meals rather than three large meals a day.  Limit foods that are high in fat and processed sugars, such as fried and sweet foods.  To prevent constipation: ? Drink enough fluid to keep your urine clear or pale yellow. ? Eat foods that are high in fiber, such as fresh fruits and vegetables, whole grains, and beans. Activity  Exercise only as directed by your health care provider. Most women can continue their usual exercise routine during pregnancy. Try to exercise for 30 minutes at least 5 days a week. Stop exercising if you experience uterine contractions.  Avoid heavy lifting.  Do   not exercise in extreme heat or humidity, or at high altitudes.  Wear low-heel, comfortable shoes.  Practice good posture.  You may continue to have sex unless your health care provider tells you otherwise. Relieving pain and discomfort  Take frequent breaks and rest with your legs elevated if you have leg cramps or low back pain.  Take warm sitz baths to soothe any pain or discomfort caused by hemorrhoids. Use hemorrhoid cream if your health care provider approves.  Wear a good support bra to prevent discomfort from breast tenderness.  If you develop varicose veins: ? Wear support pantyhose or compression stockings as told by your healthcare provider. ? Elevate your feet for 15 minutes, 3-4 times a day. Prenatal care  Write down your questions. Take them to your prenatal visits.  Keep all your prenatal visits as told by your health care provider. This is important. Safety  Wear your seat belt at all times when driving.  Make  a list of emergency phone numbers, including numbers for family, friends, the hospital, and police and fire departments. General instructions  Avoid cat litter boxes and soil used by cats. These carry germs that can cause birth defects in the baby. If you have a cat, ask someone to clean the litter box for you.  Do not travel far distances unless it is absolutely necessary and only with the approval of your health care provider.  Do not use hot tubs, steam rooms, or saunas.  Do not drink alcohol.  Do not use any products that contain nicotine or tobacco, such as cigarettes and e-cigarettes. If you need help quitting, ask your health care provider.  Do not use any medicinal herbs or unprescribed drugs. These chemicals affect the formation and growth of the baby.  Do not douche or use tampons or scented sanitary pads.  Do not cross your legs for long periods of time.  To prepare for the arrival of your baby: ? Take prenatal classes to understand, practice, and ask questions about labor and delivery. ? Make a trial run to the hospital. ? Visit the hospital and tour the maternity area. ? Arrange for maternity or paternity leave through employers. ? Arrange for family and friends to take care of pets while you are in the hospital. ? Purchase a rear-facing car seat and make sure you know how to install it in your car. ? Pack your hospital bag. ? Prepare the baby's nursery. Make sure to remove all pillows and stuffed animals from the baby's crib to prevent suffocation.  Visit your dentist if you have not gone during your pregnancy. Use a soft toothbrush to brush your teeth and be gentle when you floss. Contact a health care provider if:  You are unsure if you are in labor or if your water has broken.  You become dizzy.  You have mild pelvic cramps, pelvic pressure, or nagging pain in your abdominal area.  You have lower back pain.  You have persistent nausea, vomiting, or  diarrhea.  You have an unusual or bad smelling vaginal discharge.  You have pain when you urinate. Get help right away if:  Your water breaks before 37 weeks.  You have regular contractions less than 5 minutes apart before 37 weeks.  You have a fever.  You are leaking fluid from your vagina.  You have spotting or bleeding from your vagina.  You have severe abdominal pain or cramping.  You have rapid weight loss or weight gain.  You have   shortness of breath with chest pain.  You notice sudden or extreme swelling of your face, hands, ankles, feet, or legs.  Your baby makes fewer than 10 movements in 2 hours.  You have severe headaches that do not go away when you take medicine.  You have vision changes. Summary  The third trimester is from week 28 through week 40, months 7 through 9. The third trimester is a time when the unborn baby (fetus) is growing rapidly.  During the third trimester, your discomfort may increase as you and your baby continue to gain weight. You may have abdominal, leg, and back pain, sleeping problems, and an increased need to urinate.  During the third trimester your breasts will keep growing and they will continue to become tender. A yellow fluid (colostrum) may leak from your breasts. This is the first milk you are producing for your baby.  False labor is a condition in which you feel small, irregular tightenings of the muscles in the womb (contractions) that eventually go away. These are called Braxton Hicks contractions. Contractions may last for hours, days, or even weeks before true labor sets in.  Signs of labor can include: abdominal cramps; regular contractions that start at 10 minutes apart and become stronger and more frequent with time; watery or bloody mucus discharge that comes from the vagina; increased pelvic pressure and dull back pain; and leaking of amniotic fluid. This information is not intended to replace advice given to you by your  health care provider. Make sure you discuss any questions you have with your health care provider. Document Revised: 05/13/2018 Document Reviewed: 02/26/2016 [employer] Patient Education  2020 [employer]  

## 2019-09-07 NOTE — MAU Provider Note (Signed)
History     CSN: [id number]  Arrival date and time: 09/07/19 1251   First Provider Initiated Contact with Patient 09/07/19 1444      Chief Complaint  Patient presents with  . Dizziness  . Blurred Vision   Victoria Robinson is a 34 y.o. [id number] at [redacted]w[redacted]d who presents today with blurry vision and dizziness. She also has occasional headaches that come and go. She states that the blurry vision started yesterday and just comes and goes randomly. She denies any VB or LOF. She reports that at night she will get contractions. She reports normal fetal movement. Patient has CHTN and takes 200mg  labetalol BID.  Dizziness This is a new problem. The current episode started yesterday. The problem occurs intermittently. Nothing aggravates the symptoms. She has tried nothing for the symptoms.    OB History    Gravida  5   Para  3   Term  3   Preterm      AB  1   Living  3     SAB  1   TAB      Ectopic      Multiple  0   Live Births  3           Past Medical History:  Diagnosis Date  . Abnormal Pap smear    repeat WNL  . Asthma childhood  . Shortness of breath     Past Surgical History:  Procedure Laterality Date  . APPENDECTOMY      Family History  Problem Relation Age of Onset  . Hyperlipidemia Mother   . Hyperlipidemia Father   . Hypertension Father   . Diabetes Maternal Grandmother   . Diabetes Paternal Grandmother     Social History   Tobacco Use  . Smoking status: Former Smoker    Packs/day: 0.50    Types: Cigarettes  . Smokeless tobacco: Never Used  Vaping Use  . Vaping Use: Never used  Substance Use Topics  . Alcohol use: Not Currently    Comment: occasionally   . Drug use: No    Allergies: No Known Allergies  Medications Prior to Admission  Medication Sig Dispense Refill Last Dose  . acetaminophen (TYLENOL) 325 MG tablet Take 650 mg by mouth every 6 (six) hours as needed for moderate pain.    Past Week at Unknown time  . aspirin EC 81  MG tablet Take 1 tablet (81 mg total) by mouth daily. Take after 12 weeks for prevention of preeclampsia later in pregnancy 300 tablet 2 09/06/2019 at Unknown time  . labetalol (NORMODYNE) 200 MG tablet Take 1 tablet (200 mg total) by mouth 2 (two) times daily. 60 tablet 3 09/07/2019 at Unknown time  . Prenatal Vit w/Fe-Methylfol-FA (PNV PO) Take 1 tablet by mouth daily.    09/06/2019 at Unknown time  . valACYclovir (VALTREX) 500 MG tablet Take 1 tablet (500 mg total) by mouth daily. 30 tablet 0 09/06/2019 at Unknown time  . ondansetron (ZOFRAN ODT) 4 MG disintegrating tablet Take 1 tablet (4 mg total) by mouth every 6 (six) hours as needed for nausea. (Patient not taking: Reported on 08/09/2019) 6 tablet 0     Review of Systems  Neurological: Positive for dizziness.   Physical Exam   Blood pressure 131/74, pulse 78, resp. rate 18, height 5\' 1"  (1.549 m), weight 129.1 kg, last menstrual period 01/11/2019, SpO2 99 %, currently breastfeeding.  Physical Exam Vitals reviewed.  HENT:     Head: Normocephalic.  Cardiovascular:     Rate and Rhythm: Normal rate.  Pulmonary:     Effort: Pulmonary effort is normal.  Abdominal:     Palpations: Abdomen is soft.     Tenderness: There is no abdominal tenderness.  Musculoskeletal:     Right lower leg: Edema present.     Left lower leg: Edema present.  Skin:    General: Skin is warm and dry.  Neurological:     Mental Status: She is alert and oriented to person, place, and time.  Psychiatric:        Mood and Affect: Mood normal.    NST:  Baseline: 135 Variability: moderate Accels: 15x15 Decels: none Toco: none Reactive  Results for orders placed or performed during the hospital encounter of 09/07/19 (from the past 24 hour(s))  Urinalysis, Routine w reflex microscopic Urine, Clean Catch     Status: Abnormal   Collection Time: 09/07/19  2:01 PM  Result Value Ref Range   Color, Urine YELLOW YELLOW   APPearance HAZY (A) CLEAR   Specific Gravity,  Urine 1.019 1.005 - 1.030   pH 6.0 5.0 - 8.0   Glucose, UA NEGATIVE NEGATIVE mg/dL   Hgb urine dipstick NEGATIVE NEGATIVE   Bilirubin Urine NEGATIVE NEGATIVE   Ketones, ur NEGATIVE NEGATIVE mg/dL   Protein, ur NEGATIVE NEGATIVE mg/dL   Nitrite NEGATIVE NEGATIVE   Leukocytes,Ua NEGATIVE NEGATIVE  Protein / creatinine ratio, urine     Status: None   Collection Time: 09/07/19  3:02 PM  Result Value Ref Range   Creatinine, Urine 184.99 mg/dL   Total Protein, Urine 19 mg/dL   Protein Creatinine Ratio 0.10 0.00 - 0.15 mg/mg[Cre]  CBC with Differential/Platelet     Status: Abnormal   Collection Time: 09/07/19  3:15 PM  Result Value Ref Range   WBC 8.4 4.0 - 10.5 K/uL   RBC 3.68 (L) 3.87 - 5.11 MIL/uL   Hemoglobin 11.2 (L) 12.0 - 15.0 g/dL   HCT 56.4 (L) 36 - 46 %   MCV 94.3 80.0 - 100.0 fL   MCH 30.4 26.0 - 34.0 pg   MCHC 32.3 30.0 - 36.0 g/dL   RDW 33.2 95.1 - 88.4 %   Platelets 160 150 - 400 K/uL   nRBC 0.0 0.0 - 0.2 %   Neutrophils Relative % 76 %   Neutro Abs 6.4 1.7 - 7.7 K/uL   Lymphocytes Relative 16 %   Lymphs Abs 1.4 0.7 - 4.0 K/uL   Monocytes Relative 7 %   Monocytes Absolute 0.6 0 - 1 K/uL   Eosinophils Relative 1 %   Eosinophils Absolute 0.1 0 - 0 K/uL   Basophils Relative 0 %   Basophils Absolute 0.0 0 - 0 K/uL   Immature Granulocytes 0 %   Abs Immature Granulocytes 0.03 0.00 - 0.07 K/uL  Comprehensive metabolic panel     Status: Abnormal   Collection Time: 09/07/19  3:15 PM  Result Value Ref Range   Sodium 135 135 - 145 mmol/L   Potassium 3.9 3.5 - 5.1 mmol/L   Chloride 105 98 - 111 mmol/L   CO2 23 22 - 32 mmol/L   Glucose, Bld 97 70 - 99 mg/dL   BUN 5 (L) 6 - 20 mg/dL   Creatinine, Ser 1.66 0.44 - 1.00 mg/dL   Calcium 8.9 8.9 - 06.3 mg/dL   Total Protein 6.1 (L) 6.5 - 8.1 g/dL   Albumin 2.9 (L) 3.5 - 5.0 g/dL   AST 14 (L) 15 -  41 U/L   ALT 15 0 - 44 U/L   Alkaline Phosphatase 58 38 - 126 U/L   Total Bilirubin 0.5 0.3 - 1.2 mg/dL   GFR calc non Af Amer  >60 >60 mL/min   GFR calc Af Amer >60 >60 mL/min   Anion gap 7 5 - 15    MAU Course  Procedures  MDM EKG: 1st degree heart block. Dr. Shawnie Pons reviewed EKG and no interventions needed at this time.   Reviewed patients HPI, physical exam and labs with Dr. Shawnie Pons, patient ok for DC home. Has appt with MFM tomorrow for follow up.   Assessment and Plan   1. Dizziness   2. Multigravida of advanced maternal age in third trimester   3. Chronic hypertension during pregnancy, antepartum   4. Blurry vision   5. Chronic hypertension in pregnancy   6. [redacted] weeks gestation of pregnancy    DC home 3rd Trimester precautions  Pre-eclampsia warning signs  PTL precautions  Fetal kick counts RX: none  Return to MAU as needed FU with OB as planned   Follow-up Information    [facility] Follow up.   Contact information: [address] [place] [address] [phone]             Thressa Sheller DNP, CNM  09/07/19  7:10 PM

## 2019-09-07 NOTE — MAU Note (Signed)
Pt states she had blurry vision yesterday. Has slight headache.  Today she is dizzy and feels pressure behind her eyes. Also has increased swelling in her legs. Has chronic HTN. 142/82 was last blood pressure at home.  Has occasional BH contractions, denies LOF or VB, +FM.

## 2019-09-08 ENCOUNTER — Ambulatory Visit: Payer: BC Managed Care – PPO

## 2019-09-08 ENCOUNTER — Ambulatory Visit: Payer: BC Managed Care – PPO | Attending: Obstetrics and Gynecology

## 2019-09-08 ENCOUNTER — Other Ambulatory Visit: Payer: Self-pay | Admitting: *Deleted

## 2019-09-08 ENCOUNTER — Ambulatory Visit: Payer: BC Managed Care – PPO | Admitting: *Deleted

## 2019-09-08 ENCOUNTER — Other Ambulatory Visit: Payer: BC Managed Care – PPO

## 2019-09-08 ENCOUNTER — Encounter: Payer: Self-pay | Admitting: *Deleted

## 2019-09-08 DIAGNOSIS — O99213 Obesity complicating pregnancy, third trimester: Secondary | ICD-10-CM

## 2019-09-08 DIAGNOSIS — O10919 Unspecified pre-existing hypertension complicating pregnancy, unspecified trimester: Secondary | ICD-10-CM

## 2019-09-08 DIAGNOSIS — O09523 Supervision of elderly multigravida, third trimester: Secondary | ICD-10-CM | POA: Diagnosis not present

## 2019-09-08 DIAGNOSIS — O10913 Unspecified pre-existing hypertension complicating pregnancy, third trimester: Secondary | ICD-10-CM | POA: Diagnosis not present

## 2019-09-08 DIAGNOSIS — O09893 Supervision of other high risk pregnancies, third trimester: Secondary | ICD-10-CM

## 2019-09-08 DIAGNOSIS — Z3A34 34 weeks gestation of pregnancy: Secondary | ICD-10-CM

## 2019-09-12 ENCOUNTER — Other Ambulatory Visit: Payer: Self-pay

## 2019-09-12 ENCOUNTER — Ambulatory Visit (INDEPENDENT_AMBULATORY_CARE_PROVIDER_SITE_OTHER): Payer: BC Managed Care – PPO | Admitting: Obstetrics & Gynecology

## 2019-09-12 VITALS — BP 115/76 | HR 88 | Wt 282.6 lb

## 2019-09-12 DIAGNOSIS — O10919 Unspecified pre-existing hypertension complicating pregnancy, unspecified trimester: Secondary | ICD-10-CM

## 2019-09-12 DIAGNOSIS — Z3480 Encounter for supervision of other normal pregnancy, unspecified trimester: Secondary | ICD-10-CM

## 2019-09-12 DIAGNOSIS — O09523 Supervision of elderly multigravida, third trimester: Secondary | ICD-10-CM

## 2019-09-12 LAB — POCT URINALYSIS DIPSTICK
Bilirubin, UA: NEGATIVE
Glucose, UA: NEGATIVE
Ketones, UA: NEGATIVE
Leukocytes, UA: NEGATIVE
Nitrite, UA: NEGATIVE
Protein, UA: NEGATIVE
Spec Grav, UA: 1.02 (ref 1.010–1.025)
Urobilinogen, UA: 0.2 E.U./dL
pH, UA: 7 (ref 5.0–8.0)

## 2019-09-12 MED ORDER — VALACYCLOVIR HCL 500 MG PO TABS: 500.0000 mg | ORAL_TABLET | Freq: Every day | ORAL | 0 refills | Status: DC

## 2019-09-12 NOTE — Patient Instructions (Signed)

## 2019-09-12 NOTE — Progress Notes (Signed)
Pt. Request Valtrex prescription to be placed back at x2 a day   Pt.states she has white discharge, denies any itching or burning.

## 2019-09-12 NOTE — Progress Notes (Signed)
   PRENATAL VISIT NOTE  Subjective:  Victoria Robinson is a 34 y.o. [id number] at [redacted]w[redacted]d being seen today for ongoing prenatal care.  She is currently monitored for the following issues for this high-risk pregnancy and has Obesity; Herpetic whitlow; Short interval between pregnancies affecting pregnancy, antepartum; History of gestational hypertension; Supervision of other normal pregnancy, antepartum; Chronic hypertension during pregnancy, antepartum; and AMA (advanced maternal age) multigravida 35+ on their problem list.  Patient reports no complaints.  Contractions: Irritability. Vag. Bleeding: None.  Movement: Present. Denies leaking of fluid.   The following portions of the patient's history were reviewed and updated as appropriate: allergies, current medications, past family history, past medical history, past social history, past surgical history and problem list.   Objective:   Vitals:   09/12/19 1347  BP: 115/76  Pulse: 88  Weight: 282 lb 9.6 oz (128.2 kg)    Fetal Status:     Movement: Present     General:  Alert, oriented and cooperative. Patient is in no acute distress.  Skin: Skin is warm and dry. No rash noted.   Cardiovascular: Normal heart rate noted  Respiratory: Normal respiratory effort, no problems with respiration noted  Abdomen: Soft, gravid, appropriate for gestational age.  Pain/Pressure: Present     Pelvic: Cervical exam deferred        Extremities: Normal range of motion.     Mental Status: Normal mood and affect. Normal behavior. Normal judgment and thought content.   Assessment and Plan:  Pregnancy: [id number] at [redacted]w[redacted]d 1. Multigravida of advanced maternal age in third trimester Weekly fetal testing  2. Supervision of other normal pregnancy, antepartum  - valACYclovir (VALTREX) 500 MG tablet; Take 1 tablet (500 mg total) by mouth daily.  Dispense: 60 tablet; Refill: 0  3. Chronic hypertension during pregnancy, antepartum BP well controlled. US shows breech  presentation - POCT Urinalysis Dipstick  Preterm labor symptoms and general obstetric precautions including but not limited to vaginal bleeding, contractions, leaking of fluid and fetal movement were reviewed in detail with the patient. Please refer to After Visit Summary for other counseling recommendations.   Return in about 1 week (around 09/19/2019).  Future Appointments  Date Time Provider Department Center  09/15/2019  8:00 AM [facility] NURSE [facility]  09/15/2019  8:15 AM [facility] US2 [facility]  09/22/2019  8:00 AM [facility] NURSE [facility]  09/22/2019  8:45 AM [facility] US6 [facility]  09/29/2019  8:30 AM [facility]URSE [facility]  09/29/2019  8:45 AM [facility] US6 [facility]    Scheryl Darter, MD

## 2019-09-15 ENCOUNTER — Other Ambulatory Visit: Payer: Self-pay

## 2019-09-15 ENCOUNTER — Ambulatory Visit: Payer: BC Managed Care – PPO | Attending: Obstetrics and Gynecology

## 2019-09-15 ENCOUNTER — Ambulatory Visit: Payer: BC Managed Care – PPO | Admitting: *Deleted

## 2019-09-15 DIAGNOSIS — O10013 Pre-existing essential hypertension complicating pregnancy, third trimester: Secondary | ICD-10-CM | POA: Diagnosis not present

## 2019-09-15 DIAGNOSIS — O10919 Unspecified pre-existing hypertension complicating pregnancy, unspecified trimester: Secondary | ICD-10-CM

## 2019-09-15 DIAGNOSIS — Z3A35 35 weeks gestation of pregnancy: Secondary | ICD-10-CM

## 2019-09-15 DIAGNOSIS — O99210 Obesity complicating pregnancy, unspecified trimester: Secondary | ICD-10-CM | POA: Diagnosis not present

## 2019-09-15 DIAGNOSIS — O09893 Supervision of other high risk pregnancies, third trimester: Secondary | ICD-10-CM | POA: Diagnosis not present

## 2019-09-15 DIAGNOSIS — O09523 Supervision of elderly multigravida, third trimester: Secondary | ICD-10-CM | POA: Diagnosis not present

## 2019-09-20 ENCOUNTER — Encounter: Payer: Self-pay | Admitting: Obstetrics and Gynecology

## 2019-09-20 ENCOUNTER — Other Ambulatory Visit: Payer: Self-pay

## 2019-09-20 ENCOUNTER — Other Ambulatory Visit (HOSPITAL_COMMUNITY)
Admission: RE | Admit: 2019-09-20 | Discharge: 2019-09-20 | Disposition: A | Discharge status: Home / Self Care | Payer: BC Managed Care – PPO | Source: Ambulatory Visit | Attending: Obstetrics and Gynecology | Admitting: Obstetrics and Gynecology

## 2019-09-20 ENCOUNTER — Ambulatory Visit (INDEPENDENT_AMBULATORY_CARE_PROVIDER_SITE_OTHER): Payer: BC Managed Care – PPO | Admitting: Obstetrics and Gynecology

## 2019-09-20 VITALS — BP 132/86 | HR 81 | Wt 286.4 lb

## 2019-09-20 DIAGNOSIS — Z3480 Encounter for supervision of other normal pregnancy, unspecified trimester: Secondary | ICD-10-CM | POA: Insufficient documentation

## 2019-09-20 DIAGNOSIS — O10919 Unspecified pre-existing hypertension complicating pregnancy, unspecified trimester: Secondary | ICD-10-CM

## 2019-09-20 DIAGNOSIS — O09523 Supervision of elderly multigravida, third trimester: Secondary | ICD-10-CM

## 2019-09-20 NOTE — Patient Instructions (Signed)
Breech Birth A breech birth is when a baby is born with the buttocks or feet first. Most babies are in a head down (vertex) position when they are born. There are three types of breech babies:  When the baby's buttocks are showing first in the vagina (birth canal) with the legs bent at the knees and the feet down near the buttocks (complete breech).  When the baby's buttocks are showing first in the birth canal with the legs straight up and the feet at the baby's head (frank breech).  When one or both of the baby's feet are showing first in the birth canal along with the buttocks (footling breech). What are the health risks of having a breech birth? Having a breech birth increases the health risks to your baby. A breech birth may cause the following:  Umbilical cord prolapse. This is when the umbilical cord enters the birth canal ahead of the baby, before or during labor. This can cause the cord to become pinched or compressed as labor continues. This can reduce the flow of blood and oxygen to the baby.  The baby getting stuck in the birth canal, which can cause injury or, rarely, death.  Injury to the baby's nerves in the shoulder, arm, and hand (brachial plexus injury) when delivered. What increases the risk of having a breech baby? It is not known what causes your baby to be breech. However, you are more likely to have a breech baby if:  You have had a previous pregnancy.  You are having more than one baby.  Your baby has certain birth (congenital) defects.  You have started your labor earlier than expected (premature labor).  You have problems with your uterus, such as a tumor or abnormally shaped uterus.  You have too much or not enough fluid surrounding the baby (amniotic fluid). How do I know if my baby is breech? There are no symptoms for you to know that your baby is breech. When you are close to your due date, your health care provider can tell if your baby is breech by  doing:  An abdominal or vaginal (pelvic) exam.  An ultrasound. What can be done if my baby is breech?  Your health care provider may try to turn the baby in your uterus. He or she will use a procedure called external cephalic version (ECV). He or she will place both hands on your abdomen and gently and slowly turn the baby around. It is important to know that ECV can increase your chances of suddenly going into labor. For this reason, an ECV is only done toward the end of a healthy pregnancy. The baby may remain in this position, but sometimes he or she may turn back to the breech position. You and your health care provider will discuss if an ECV is recommended for you and your baby. How will I delivery my baby if he or she is breech? You and your health care provider will discuss the best way to deliver your baby. If your baby is breech, it is less likely that a vaginal delivery will be recommended due to the risks to you and your baby. Your health care provider may recommend that you deliver your baby through a Cesarean section (C-section). A C-section is the surgical delivery of a baby through an incision in the abdomen and the uterus. Summary  A breech birth is when a baby is born with the buttocks or feet first.  Having a breech birth may   increase the risks to your baby.  Your health care provider may try to turn your baby in your uterus using a procedure called an external cephalic version (ECV).  If your baby cannot be turned to a head down position or if your baby remains in a breech position, your health care provider will make recommendations about the safest way to deliver your baby. This information is not intended to replace advice given to you by your health care provider. Make sure you discuss any questions you have with your health care provider. Document Revised: 01/02/2017 Document Reviewed: 10/16/2016 Elsevier Patient Education  2020 Elsevier Inc.  

## 2019-09-20 NOTE — Progress Notes (Signed)
   PRENATAL VISIT NOTE  Subjective:  Victoria Robinson is a 34 y.o. [id number] at [redacted]w[redacted]d being seen today for ongoing prenatal care.  She is currently monitored for the following issues for this high-risk pregnancy and has Obesity; Herpetic whitlow; Short interval between pregnancies affecting pregnancy, antepartum; History of gestational hypertension; Supervision of other normal pregnancy, antepartum; Chronic hypertension during pregnancy, antepartum; and AMA (advanced maternal age) multigravida 35+ on their problem list.  Patient reports no complaints.  Contractions: Irregular. Vag. Bleeding: None.  Movement: Present. Denies leaking of fluid.   The following portions of the patient's history were reviewed and updated as appropriate: allergies, current medications, past family history, past medical history, past social history, past surgical history and problem list.   Objective:   Vitals:   09/20/19 1514  BP: 132/86  Pulse: 81  Weight: 286 lb 6.4 oz (129.9 kg)    Fetal Status: Fetal Heart Rate (bpm): 158   Movement: Present     General:  Alert, oriented and cooperative. Patient is in no acute distress.  Skin: Skin is warm and dry. No rash noted.   Cardiovascular: Normal heart rate noted  Respiratory: Normal respiratory effort, no problems with respiration noted  Abdomen: Soft, gravid, appropriate for gestational age.  Pain/Pressure: Present     Pelvic: Cervical exam performed in the presence of a chaperone        Extremities: Normal range of motion.  Edema: None  Mental Status: Normal mood and affect. Normal behavior. Normal judgment and thought content.   Assessment and Plan:  Pregnancy: [id number] at [redacted]w[redacted]d 1. Supervision of other normal pregnancy, antepartum Patient is doing well without complaints Cultures today Persistent breech presentation on 8/12 scan. Vertex on today's exam. Discussed ECV with patient if fetal malpresentation on 8/19.   2. Chronic hypertension during  pregnancy, antepartum Continue labetalol 200 BID Continue ASA Patient is interested in IOL on 9/3 (38.3wk) due to family concerns. Will have MFM weigh in  3. Multigravida of advanced maternal age in third trimester Negative screening  Preterm labor symptoms and general obstetric precautions including but not limited to vaginal bleeding, contractions, leaking of fluid and fetal movement were reviewed in detail with the patient. Please refer to After Visit Summary for other counseling recommendations.   Return in about 1 week (around 09/27/2019) for in person, ROB, High risk.  Future Appointments  Date Time Provider Department Center  09/22/2019  8:00 AM [facility] NURSE [facility]  09/22/2019  8:45 AM [facility] US6 [facility]  09/29/2019  8:30 AM [facility]URSE [facility]  09/29/2019  8:45 AM [facility] US6 [facility]    Holden Draughon, MD

## 2019-09-22 ENCOUNTER — Other Ambulatory Visit: Payer: Self-pay

## 2019-09-22 ENCOUNTER — Ambulatory Visit: Payer: BC Managed Care – PPO | Attending: Obstetrics and Gynecology

## 2019-09-22 ENCOUNTER — Encounter: Payer: Self-pay | Admitting: *Deleted

## 2019-09-22 ENCOUNTER — Ambulatory Visit: Payer: BC Managed Care – PPO | Admitting: *Deleted

## 2019-09-22 DIAGNOSIS — O09523 Supervision of elderly multigravida, third trimester: Secondary | ICD-10-CM

## 2019-09-22 DIAGNOSIS — Z3A36 36 weeks gestation of pregnancy: Secondary | ICD-10-CM | POA: Diagnosis not present

## 2019-09-22 DIAGNOSIS — O10013 Pre-existing essential hypertension complicating pregnancy, third trimester: Secondary | ICD-10-CM | POA: Diagnosis not present

## 2019-09-22 DIAGNOSIS — E669 Obesity, unspecified: Secondary | ICD-10-CM | POA: Diagnosis not present

## 2019-09-22 DIAGNOSIS — O10919 Unspecified pre-existing hypertension complicating pregnancy, unspecified trimester: Secondary | ICD-10-CM | POA: Diagnosis present

## 2019-09-22 DIAGNOSIS — O99213 Obesity complicating pregnancy, third trimester: Secondary | ICD-10-CM | POA: Diagnosis not present

## 2019-09-22 LAB — CERVICOVAGINAL ANCILLARY ONLY
Chlamydia: NEGATIVE
Comment: NEGATIVE
Comment: NORMAL
Neisseria Gonorrhea: NEGATIVE

## 2019-09-24 LAB — CULTURE, BETA STREP (GROUP B ONLY): Strep Gp B Culture: NEGATIVE

## 2019-09-25 ENCOUNTER — Inpatient Hospital Stay (HOSPITAL_COMMUNITY)
Admission: AD | Admit: 2019-09-25 | Discharge: 2019-09-26 | Discharge status: Against Medical Advice | Payer: BC Managed Care – PPO | Attending: Obstetrics and Gynecology | Admitting: Obstetrics and Gynecology

## 2019-09-25 ENCOUNTER — Other Ambulatory Visit: Payer: Self-pay

## 2019-09-25 DIAGNOSIS — W19XXXA Unspecified fall, initial encounter: Secondary | ICD-10-CM

## 2019-09-25 DIAGNOSIS — Z3689 Encounter for other specified antenatal screening: Secondary | ICD-10-CM

## 2019-09-25 DIAGNOSIS — Z7982 Long term (current) use of aspirin: Secondary | ICD-10-CM | POA: Insufficient documentation

## 2019-09-25 DIAGNOSIS — Y92002 Bathroom of unspecified non-institutional (private) residence single-family (private) house as the place of occurrence of the external cause: Secondary | ICD-10-CM | POA: Insufficient documentation

## 2019-09-25 DIAGNOSIS — O4703 False labor before 37 completed weeks of gestation, third trimester: Secondary | ICD-10-CM | POA: Insufficient documentation

## 2019-09-25 DIAGNOSIS — Z3A36 36 weeks gestation of pregnancy: Secondary | ICD-10-CM | POA: Insufficient documentation

## 2019-09-25 DIAGNOSIS — Z87891 Personal history of nicotine dependence: Secondary | ICD-10-CM | POA: Insufficient documentation

## 2019-09-25 DIAGNOSIS — Z79899 Other long term (current) drug therapy: Secondary | ICD-10-CM | POA: Insufficient documentation

## 2019-09-25 DIAGNOSIS — Y9389 Activity, other specified: Secondary | ICD-10-CM | POA: Insufficient documentation

## 2019-09-25 DIAGNOSIS — O479 False labor, unspecified: Secondary | ICD-10-CM

## 2019-09-25 DIAGNOSIS — W182XXA Fall in (into) shower or empty bathtub, initial encounter: Secondary | ICD-10-CM | POA: Insufficient documentation

## 2019-09-25 NOTE — MAU Note (Signed)
Victoria Robinson is a 34 y.o. at [redacted]w[redacted]d here in MAU reporting: contractions 5-7 mins apart, pt states she fell in her bath tub prior to coming here and landed on her side but was not injured.  Onset of complaint: 8pm Pain score: 6 There were no vitals filed for this visit.   [id number] Lab orders placed from triage:

## 2019-09-26 ENCOUNTER — Encounter (HOSPITAL_COMMUNITY): Payer: Self-pay | Admitting: Obstetrics and Gynecology

## 2019-09-26 ENCOUNTER — Other Ambulatory Visit: Payer: Self-pay | Admitting: Family Medicine

## 2019-09-26 DIAGNOSIS — Y92002 Bathroom of unspecified non-institutional (private) residence single-family (private) house as the place of occurrence of the external cause: Secondary | ICD-10-CM | POA: Diagnosis not present

## 2019-09-26 DIAGNOSIS — O4703 False labor before 37 completed weeks of gestation, third trimester: Secondary | ICD-10-CM

## 2019-09-26 DIAGNOSIS — O99891 Other specified diseases and conditions complicating pregnancy: Secondary | ICD-10-CM

## 2019-09-26 DIAGNOSIS — Z79899 Other long term (current) drug therapy: Secondary | ICD-10-CM | POA: Diagnosis not present

## 2019-09-26 DIAGNOSIS — W19XXXA Unspecified fall, initial encounter: Secondary | ICD-10-CM

## 2019-09-26 DIAGNOSIS — Z3A36 36 weeks gestation of pregnancy: Secondary | ICD-10-CM | POA: Diagnosis not present

## 2019-09-26 DIAGNOSIS — Z7982 Long term (current) use of aspirin: Secondary | ICD-10-CM | POA: Diagnosis not present

## 2019-09-26 DIAGNOSIS — Z87891 Personal history of nicotine dependence: Secondary | ICD-10-CM | POA: Diagnosis not present

## 2019-09-26 DIAGNOSIS — Y9389 Activity, other specified: Secondary | ICD-10-CM | POA: Diagnosis not present

## 2019-09-26 DIAGNOSIS — W182XXA Fall in (into) shower or empty bathtub, initial encounter: Secondary | ICD-10-CM | POA: Diagnosis not present

## 2019-09-26 NOTE — MAU Note (Signed)
Patient called out stating that she wanted to speak to her nurse. RN at bedside. Patient requesting to leave AMA. Reviewed risks of leaving AMA. Patient still requesting to leave. AMA formed signed by patient. Precautions of when to return to MAU discussed with patient and patient's significant other. Patient verbalized understanding.

## 2019-09-26 NOTE — MAU Provider Note (Signed)
History     CSN: [id number]  Arrival date and time: 09/25/19 2328   First Provider Initiated Contact with Patient 09/26/19 0037      Chief Complaint  Patient presents with  . Contractions   33 y.o. [id number] @36 .6 wks presenting for ctx. Ctx started around 10pm. Frequency is q5 min. She also reports falling in the bath tub around 2220. She landed on her right hip. Denies abdominal contact. Denies VB or LOF. +FM.   OB History    Gravida  5   Para  3   Term  3   Preterm      AB  1   Living  3     SAB  1   TAB      Ectopic      Multiple  0   Live Births  3           Past Medical History:  Diagnosis Date  . Abnormal Pap smear    repeat WNL  . Asthma childhood  . Shortness of breath     Past Surgical History:  Procedure Laterality Date  . APPENDECTOMY      Family History  Problem Relation Age of Onset  . Hyperlipidemia Mother   . Hyperlipidemia Father   . Hypertension Father   . Diabetes Maternal Grandmother   . Diabetes Paternal Grandmother     Social History   Tobacco Use  . Smoking status: Former Smoker    Packs/day: 0.50    Types: Cigarettes  . Smokeless tobacco: Never Used  Vaping Use  . Vaping Use: Never used  Substance Use Topics  . Alcohol use: Not Currently    Comment: occasionally   . Drug use: No    Allergies: No Known Allergies  Medications Prior to Admission  Medication Sig Dispense Refill Last Dose  . aspirin EC 81 MG tablet Take 1 tablet (81 mg total) by mouth daily. Take after 12 weeks for prevention of preeclampsia later in pregnancy 300 tablet 2 09/24/2019 at Unknown time  . labetalol (NORMODYNE) 200 MG tablet Take 1 tablet (200 mg total) by mouth 2 (two) times daily. 60 tablet 3 09/25/2019 at 7pm  . Prenatal Vit w/Fe-Methylfol-FA (PNV PO) Take 1 tablet by mouth daily.    09/24/2019 at Unknown time  . valACYclovir (VALTREX) 500 MG tablet Take 1 tablet (500 mg total) by mouth daily. 60 tablet 0 09/25/2019 at 7pm  .  acetaminophen (TYLENOL) 325 MG tablet Take 650 mg by mouth every 6 (six) hours as needed for moderate pain.      09/27/2019 ondansetron (ZOFRAN ODT) 4 MG disintegrating tablet Take 1 tablet (4 mg total) by mouth every 6 (six) hours as needed for nausea. (Patient not taking: Reported on 08/09/2019) 6 tablet 0     Review of Systems  Gastrointestinal: Negative for abdominal pain.  Genitourinary: Negative for vaginal bleeding and vaginal discharge.   Physical Exam   Blood pressure 117/70, pulse 84, temperature 97.8 F (36.6 C), temperature source Oral, resp. rate 18, weight 128.5 kg, last menstrual period 01/11/2019, SpO2 98 %, currently breastfeeding.  Physical Exam Vitals and nursing note reviewed. Exam conducted with a chaperone present.  Constitutional:      General: She is not in acute distress.    Appearance: Normal appearance.  HENT:     Head: Normocephalic and atraumatic.  Cardiovascular:     Rate and Rhythm: Normal rate.  Pulmonary:     Effort: Pulmonary effort is normal. No respiratory  distress.  Abdominal:     Palpations: Abdomen is soft.     Tenderness: There is abdominal tenderness (RUQ, mild).  Genitourinary:    Comments: VE: 1/thick/high Musculoskeletal:        General: Normal range of motion.     Cervical back: Normal range of motion.  Skin:    General: Skin is warm and dry.  Neurological:     General: No focal deficit present.     Mental Status: She is alert and oriented to person, place, and time.  Psychiatric:        Mood and Affect: Mood normal.   EFM: 135 bpm, mod variability, + accels, no decels Toco: rare  No results found for this or any previous visit (from the past 24 hour(s)).  MAU Course  Procedures Prolonged EFM  MDM Pt requesting discharge but d/t fall I recommend prolonged EFM for at least 4 hrs. Discussed risk of placental abruption with falls and could be detrimental to her baby. She accepts this risk and left AMA.  Assessment and Plan   1. [redacted]  weeks gestation of pregnancy   2. NST (non-stress test) reactive   3. Fall, initial encounter   4. Braxton Hicks contractions    Left AMA  Donette Larry, CNM 09/26/2019, 1:20 AM

## 2019-09-27 ENCOUNTER — Encounter (HOSPITAL_COMMUNITY): Payer: Self-pay | Admitting: Family Medicine

## 2019-09-27 ENCOUNTER — Inpatient Hospital Stay (HOSPITAL_COMMUNITY): Payer: BC Managed Care – PPO

## 2019-09-27 ENCOUNTER — Observation Stay (HOSPITAL_COMMUNITY)
Admission: AD | Admit: 2019-09-27 | Discharge: 2019-09-27 | Disposition: A | Discharge status: Home / Self Care | Payer: BC Managed Care – PPO | Attending: Family Medicine | Admitting: Family Medicine

## 2019-09-27 ENCOUNTER — Other Ambulatory Visit: Payer: Self-pay

## 2019-09-27 ENCOUNTER — Other Ambulatory Visit: Payer: Self-pay | Admitting: Advanced Practice Midwife

## 2019-09-27 DIAGNOSIS — O10919 Unspecified pre-existing hypertension complicating pregnancy, unspecified trimester: Secondary | ICD-10-CM | POA: Diagnosis present

## 2019-09-27 DIAGNOSIS — Z3A36 36 weeks gestation of pregnancy: Secondary | ICD-10-CM | POA: Diagnosis not present

## 2019-09-27 DIAGNOSIS — O09529 Supervision of elderly multigravida, unspecified trimester: Secondary | ICD-10-CM

## 2019-09-27 DIAGNOSIS — O322XX0 Maternal care for transverse and oblique lie, not applicable or unspecified: Secondary | ICD-10-CM | POA: Diagnosis present

## 2019-09-27 DIAGNOSIS — Z3490 Encounter for supervision of normal pregnancy, unspecified, unspecified trimester: Secondary | ICD-10-CM

## 2019-09-27 MED ORDER — TERBUTALINE SULFATE 1 MG/ML IJ SOLN: 0.2500 mg | Freq: Once | INTRAMUSCULAR | Status: DC

## 2019-09-27 MED ORDER — LACTATED RINGERS IV SOLN: INTRAVENOUS | Status: DC

## 2019-09-27 NOTE — Discharge Instructions (Signed)
Please follow up in [facility]. Thursday

## 2019-09-27 NOTE — H&P (Signed)
Patient scheduled for ECV due to transverse presentation. Bedside US done and confirmed vertex presentation. Category 1 tracing. Patient discharged to home.  Levie Heritage, DO 09/27/2019 8:57 AM

## 2019-09-29 ENCOUNTER — Other Ambulatory Visit: Payer: Self-pay

## 2019-09-29 ENCOUNTER — Ambulatory Visit: Payer: BC Managed Care – PPO | Attending: Obstetrics and Gynecology | Admitting: *Deleted

## 2019-09-29 ENCOUNTER — Ambulatory Visit (HOSPITAL_BASED_OUTPATIENT_CLINIC_OR_DEPARTMENT_OTHER): Payer: BC Managed Care – PPO

## 2019-09-29 ENCOUNTER — Ambulatory Visit: Payer: BC Managed Care – PPO

## 2019-09-29 ENCOUNTER — Ambulatory Visit (INDEPENDENT_AMBULATORY_CARE_PROVIDER_SITE_OTHER): Payer: BC Managed Care – PPO | Admitting: Family Medicine

## 2019-09-29 VITALS — BP 131/84 | HR 83 | Wt 284.7 lb

## 2019-09-29 DIAGNOSIS — O09523 Supervision of elderly multigravida, third trimester: Secondary | ICD-10-CM

## 2019-09-29 DIAGNOSIS — Z3A37 37 weeks gestation of pregnancy: Secondary | ICD-10-CM | POA: Insufficient documentation

## 2019-09-29 DIAGNOSIS — O10919 Unspecified pre-existing hypertension complicating pregnancy, unspecified trimester: Secondary | ICD-10-CM

## 2019-09-29 DIAGNOSIS — Z6841 Body Mass Index (BMI) 40.0 and over, adult: Secondary | ICD-10-CM

## 2019-09-29 DIAGNOSIS — O10913 Unspecified pre-existing hypertension complicating pregnancy, third trimester: Secondary | ICD-10-CM | POA: Insufficient documentation

## 2019-09-29 DIAGNOSIS — Z79899 Other long term (current) drug therapy: Secondary | ICD-10-CM | POA: Diagnosis not present

## 2019-09-29 DIAGNOSIS — O10013 Pre-existing essential hypertension complicating pregnancy, third trimester: Secondary | ICD-10-CM

## 2019-09-29 DIAGNOSIS — Z3493 Encounter for supervision of normal pregnancy, unspecified, third trimester: Secondary | ICD-10-CM

## 2019-09-29 DIAGNOSIS — Z8759 Personal history of other complications of pregnancy, childbirth and the puerperium: Secondary | ICD-10-CM

## 2019-09-29 DIAGNOSIS — O09899 Supervision of other high risk pregnancies, unspecified trimester: Secondary | ICD-10-CM

## 2019-09-29 DIAGNOSIS — O99213 Obesity complicating pregnancy, third trimester: Secondary | ICD-10-CM | POA: Diagnosis not present

## 2019-09-29 DIAGNOSIS — Z3480 Encounter for supervision of other normal pregnancy, unspecified trimester: Secondary | ICD-10-CM

## 2019-09-29 NOTE — Progress Notes (Signed)
Patient reports fetal movement with irregular contractions. 

## 2019-09-29 NOTE — Progress Notes (Signed)
   PRENATAL VISIT NOTE  Subjective:  Victoria Robinson is a 34 y.o. [phone] at [redacted]w[redacted]d being seen today for ongoing prenatal care.  She is currently monitored for the following issues for this high-risk pregnancy and has Obesity; Herpetic whitlow; Short interval between pregnancies affecting pregnancy, antepartum; History of gestational hypertension; Supervision of other normal pregnancy, antepartum; Chronic hypertension during pregnancy, antepartum; AMA (advanced maternal age) multigravida 35+; and Vertex presentation of fetus on their problem list.  Patient reports no complaints.  Contractions: Irregular. Vag. Bleeding: None.  Movement: Present. Denies leaking of fluid.   The following portions of the patient's history were reviewed and updated as appropriate: allergies, current medications, past family history, past medical history, past social history, past surgical history and problem list.   Objective:   Vitals:   09/29/19 1603  BP: 131/84  Pulse: 83  Weight: 284 lb 11.2 oz (129.1 kg)    Fetal Status: Fetal Heart Rate (bpm): 133   Movement: Present     General:  Alert, oriented and cooperative. Patient is in no acute distress.  Skin: Skin is warm and dry. No rash noted.   Cardiovascular: Normal heart rate noted  Respiratory: Normal respiratory effort, no problems with respiration noted  Abdomen: Soft, gravid, appropriate for gestational age.  Pain/Pressure: Present     Pelvic: Cervical exam deferred        Extremities: Normal range of motion.  Edema: Trace  Mental Status: Normal mood and affect. Normal behavior. Normal judgment and thought content.   Assessment and Plan:  Pregnancy: [id number] at [redacted]w[redacted]d 1. Supervision of other normal pregnancy, antepartum Doing well, taking PNV and bASA. No complaints.  2. Chronic hypertension during pregnancy, antepartum 3. History of gestational hypertension -BPP today, not resulted yet -BP mildly elevated today, states home BP readings have  been stable -Compliant with labetalol 200 mg BID, taking bASA -Will schedule IOL today for next week in the setting of cHTN, orders placed.  4. Short interval between pregnancies affecting pregnancy, antepartum Delivered 06/17/18  5. Multigravida of advanced maternal age in third trimester  6. Vertex presentation of fetus in third trimester -Went for ECV, was vertex, ECV not performed. -Today she was told at MFM baby was transverse, will continue to monitor  7. Class 3 severe obesity with body mass index (BMI) of 45.0 to 49.9 in adult, unspecified obesity type, unspecified whether serious comorbidity present [facility])  Term labor symptoms and general obstetric precautions including but not limited to vaginal bleeding, contractions, leaking of fluid and fetal movement were reviewed in detail with the patient. Please refer to After Visit Summary for other counseling recommendations.   Return in about 1 week (around 10/06/2019) for [facility]; in pers.  No future appointments.  Alric Seton, MD

## 2019-09-30 ENCOUNTER — Encounter (HOSPITAL_COMMUNITY): Payer: Self-pay | Admitting: *Deleted

## 2019-09-30 ENCOUNTER — Telehealth (HOSPITAL_COMMUNITY): Payer: Self-pay | Admitting: *Deleted

## 2019-09-30 NOTE — Telephone Encounter (Signed)
Preadmission screen  

## 2019-10-01 ENCOUNTER — Other Ambulatory Visit (HOSPITAL_COMMUNITY)
Admission: RE | Admit: 2019-10-01 | Discharge: 2019-10-01 | Disposition: A | Discharge status: Home / Self Care | Payer: BC Managed Care – PPO | Source: Ambulatory Visit | Attending: Family Medicine | Admitting: Family Medicine

## 2019-10-01 DIAGNOSIS — Z20822 Contact with and (suspected) exposure to covid-19: Secondary | ICD-10-CM | POA: Insufficient documentation

## 2019-10-01 DIAGNOSIS — Z01812 Encounter for preprocedural laboratory examination: Secondary | ICD-10-CM | POA: Insufficient documentation

## 2019-10-01 LAB — SARS CORONAVIRUS 2 (TAT 6-24 HRS): SARS Coronavirus 2: NEGATIVE

## 2019-10-02 ENCOUNTER — Other Ambulatory Visit: Payer: Self-pay | Admitting: Advanced Practice Midwife

## 2019-10-03 ENCOUNTER — Other Ambulatory Visit: Payer: Self-pay

## 2019-10-03 ENCOUNTER — Encounter (HOSPITAL_COMMUNITY)
Admission: AD | Disposition: A | Discharge status: Home / Self Care | Payer: Self-pay | Source: Home / Self Care | Attending: Obstetrics and Gynecology

## 2019-10-03 ENCOUNTER — Inpatient Hospital Stay (HOSPITAL_COMMUNITY)
Admission: AD | Admit: 2019-10-03 | Discharge: 2019-10-05 | DRG: 784 | Disposition: A | Discharge status: Home / Self Care | Payer: BC Managed Care – PPO | Attending: Obstetrics and Gynecology | Admitting: Obstetrics and Gynecology

## 2019-10-03 ENCOUNTER — Inpatient Hospital Stay (HOSPITAL_COMMUNITY): Payer: BC Managed Care – PPO | Admitting: Anesthesiology

## 2019-10-03 ENCOUNTER — Inpatient Hospital Stay (HOSPITAL_COMMUNITY): Payer: BC Managed Care – PPO

## 2019-10-03 ENCOUNTER — Encounter (HOSPITAL_COMMUNITY): Payer: Self-pay | Admitting: Family Medicine

## 2019-10-03 DIAGNOSIS — Z8619 Personal history of other infectious and parasitic diseases: Secondary | ICD-10-CM | POA: Diagnosis present

## 2019-10-03 DIAGNOSIS — O09529 Supervision of elderly multigravida, unspecified trimester: Secondary | ICD-10-CM

## 2019-10-03 DIAGNOSIS — B0089 Other herpesviral infection: Secondary | ICD-10-CM | POA: Diagnosis present

## 2019-10-03 DIAGNOSIS — Z20822 Contact with and (suspected) exposure to covid-19: Secondary | ICD-10-CM | POA: Diagnosis present

## 2019-10-03 DIAGNOSIS — Z3490 Encounter for supervision of normal pregnancy, unspecified, unspecified trimester: Secondary | ICD-10-CM | POA: Diagnosis present

## 2019-10-03 DIAGNOSIS — O10919 Unspecified pre-existing hypertension complicating pregnancy, unspecified trimester: Secondary | ICD-10-CM | POA: Diagnosis present

## 2019-10-03 DIAGNOSIS — Z01812 Encounter for preprocedural laboratory examination: Secondary | ICD-10-CM

## 2019-10-03 DIAGNOSIS — O09523 Supervision of elderly multigravida, third trimester: Secondary | ICD-10-CM

## 2019-10-03 DIAGNOSIS — Z3480 Encounter for supervision of other normal pregnancy, unspecified trimester: Secondary | ICD-10-CM

## 2019-10-03 DIAGNOSIS — Z3A37 37 weeks gestation of pregnancy: Secondary | ICD-10-CM | POA: Diagnosis not present

## 2019-10-03 DIAGNOSIS — Z87891 Personal history of nicotine dependence: Secondary | ICD-10-CM

## 2019-10-03 DIAGNOSIS — O9852 Other viral diseases complicating childbirth: Secondary | ICD-10-CM | POA: Diagnosis present

## 2019-10-03 DIAGNOSIS — Z302 Encounter for sterilization: Secondary | ICD-10-CM

## 2019-10-03 DIAGNOSIS — O1002 Pre-existing essential hypertension complicating childbirth: Secondary | ICD-10-CM | POA: Diagnosis present

## 2019-10-03 DIAGNOSIS — Z8759 Personal history of other complications of pregnancy, childbirth and the puerperium: Secondary | ICD-10-CM

## 2019-10-03 DIAGNOSIS — O322XX0 Maternal care for transverse and oblique lie, not applicable or unspecified: Secondary | ICD-10-CM | POA: Diagnosis present

## 2019-10-03 DIAGNOSIS — O99214 Obesity complicating childbirth: Secondary | ICD-10-CM | POA: Diagnosis present

## 2019-10-03 DIAGNOSIS — O09899 Supervision of other high risk pregnancies, unspecified trimester: Secondary | ICD-10-CM

## 2019-10-03 HISTORY — PX: OTHER SURGICAL HISTORY: SHX169

## 2019-10-03 LAB — COMPREHENSIVE METABOLIC PANEL
ALT: 17 U/L (ref 0–44)
AST: 20 U/L (ref 15–41)
Albumin: 2.7 g/dL — ABNORMAL LOW (ref 3.5–5.0)
Alkaline Phosphatase: 72 U/L (ref 38–126)
Anion gap: 10 (ref 5–15)
BUN: 5 mg/dL — ABNORMAL LOW (ref 6–20)
CO2: 21 mmol/L — ABNORMAL LOW (ref 22–32)
Calcium: 8.7 mg/dL — ABNORMAL LOW (ref 8.9–10.3)
Chloride: 105 mmol/L (ref 98–111)
Creatinine, Ser: 0.69 mg/dL (ref 0.44–1.00)
GFR calc Af Amer: >60 mL/min (ref >60)
GFR calc non Af Amer: >60 mL/min (ref >60)
Glucose, Bld: 118 mg/dL — ABNORMAL HIGH (ref 70–99)
Potassium: 3.1 mmol/L — ABNORMAL LOW (ref 3.5–5.1)
Sodium: 136 mmol/L (ref 135–145)
Total Bilirubin: 0.5 mg/dL (ref 0.3–1.2)
Total Protein: 5.7 g/dL — ABNORMAL LOW (ref 6.5–8.1)

## 2019-10-03 LAB — CBC
HCT: 33.5 % — ABNORMAL LOW (ref 36.0–46.0)
Hemoglobin: 10.8 g/dL — ABNORMAL LOW (ref 12.0–15.0)
MCH: 30.6 pg (ref 26.0–34.0)
MCHC: 32.2 g/dL (ref 30.0–36.0)
MCV: 94.9 fL (ref 80.0–100.0)
Platelets: 172 10*3/uL (ref 150–400)
RBC: 3.53 MIL/uL — ABNORMAL LOW (ref 3.87–5.11)
RDW: 13.2 % (ref 11.5–15.5)
WBC: 6.4 10*3/uL (ref 4.0–10.5)
nRBC: 0 % (ref 0.0–0.2)

## 2019-10-03 LAB — TYPE AND SCREEN
ABO/RH(D): O POS
Antibody Screen: NEGATIVE

## 2019-10-03 LAB — PROTEIN / CREATININE RATIO, URINE
Creatinine, Urine: 293.35 mg/dL
Protein Creatinine Ratio: 0.09 mg/mg{Cre} (ref 0.00–0.15)
Total Protein, Urine: 26 mg/dL

## 2019-10-03 LAB — RPR: RPR Ser Ql: NONREACTIVE

## 2019-10-03 SURGERY — Surgical Case
Anesthesia: Spinal

## 2019-10-03 MED ORDER — SENNOSIDES-DOCUSATE SODIUM 8.6-50 MG PO TABS
2.0000 | ORAL_TABLET | ORAL | Status: DC
  Administered 2019-10-04 (×2): 2 via ORAL
  Filled 2019-10-03 (×2): qty 2

## 2019-10-03 MED ORDER — OXYCODONE-ACETAMINOPHEN 5-325 MG PO TABS: 1.0000 | ORAL_TABLET | ORAL | Status: DC | PRN

## 2019-10-03 MED ORDER — FENTANYL CITRATE (PF) 100 MCG/2ML IJ SOLN
INTRAMUSCULAR | Status: AC
  Filled 2019-10-03: qty 2

## 2019-10-03 MED ORDER — ONDANSETRON HCL 4 MG/2ML IJ SOLN: 4.0000 mg | Freq: Three times a day (TID) | INTRAMUSCULAR | Status: DC | PRN

## 2019-10-03 MED ORDER — MENTHOL 3 MG MT LOZG: 1.0000 | LOZENGE | OROMUCOSAL | Status: DC | PRN

## 2019-10-03 MED ORDER — FENTANYL CITRATE (PF) 100 MCG/2ML IJ SOLN
25.0000 ug | INTRAMUSCULAR | Status: DC | PRN
  Administered 2019-10-03: 25 ug via INTRAVENOUS

## 2019-10-03 MED ORDER — SIMETHICONE 80 MG PO CHEW
80.0000 mg | CHEWABLE_TABLET | Freq: Three times a day (TID) | ORAL | Status: DC
  Administered 2019-10-03 – 2019-10-05 (×4): 80 mg via ORAL
  Filled 2019-10-03 (×4): qty 1

## 2019-10-03 MED ORDER — PRENATAL MULTIVITAMIN CH
1.0000 | ORAL_TABLET | Freq: Every day | ORAL | Status: DC
  Administered 2019-10-04 – 2019-10-05 (×2): 1 via ORAL
  Filled 2019-10-03 (×2): qty 1

## 2019-10-03 MED ORDER — OXYTOCIN-SODIUM CHLORIDE 30-0.9 UT/500ML-% IV SOLN: 2.5000 [IU]/h | INTRAVENOUS | Status: DC

## 2019-10-03 MED ORDER — SOD CITRATE-CITRIC ACID 500-334 MG/5ML PO SOLN
30.0000 mL | ORAL | Status: AC
  Administered 2019-10-03: 30 mL via ORAL

## 2019-10-03 MED ORDER — DIPHENHYDRAMINE HCL 25 MG PO CAPS: 25.0000 mg | ORAL_CAPSULE | ORAL | Status: DC | PRN

## 2019-10-03 MED ORDER — ONDANSETRON HCL 4 MG/2ML IJ SOLN
INTRAMUSCULAR | Status: AC
  Filled 2019-10-03: qty 2

## 2019-10-03 MED ORDER — SOD CITRATE-CITRIC ACID 500-334 MG/5ML PO SOLN
30.0000 mL | ORAL | Status: DC | PRN
  Filled 2019-10-03: qty 30

## 2019-10-03 MED ORDER — OXYTOCIN BOLUS FROM INFUSION: 333.0000 mL | Freq: Once | INTRAVENOUS | Status: DC

## 2019-10-03 MED ORDER — MORPHINE SULFATE (PF) 0.5 MG/ML IJ SOLN
INTRAMUSCULAR | Status: AC
  Filled 2019-10-03: qty 10

## 2019-10-03 MED ORDER — BUPIVACAINE IN DEXTROSE 0.75-8.25 % IT SOLN
INTRATHECAL | Status: DC | PRN
  Administered 2019-10-03: 1.8 mL via INTRATHECAL

## 2019-10-03 MED ORDER — LIDOCAINE HCL (PF) 1 % IJ SOLN: 30.0000 mL | INTRAMUSCULAR | Status: DC | PRN

## 2019-10-03 MED ORDER — KETOROLAC TROMETHAMINE 30 MG/ML IJ SOLN
INTRAMUSCULAR | Status: AC
  Filled 2019-10-03: qty 1

## 2019-10-03 MED ORDER — ENOXAPARIN SODIUM 60 MG/0.6ML ~~LOC~~ SOLN
60.0000 mg | SUBCUTANEOUS | Status: DC
  Administered 2019-10-04 – 2019-10-05 (×2): 60 mg via SUBCUTANEOUS
  Filled 2019-10-03 (×2): qty 0.6

## 2019-10-03 MED ORDER — NALBUPHINE HCL 10 MG/ML IJ SOLN
5.0000 mg | Freq: Once | INTRAMUSCULAR | Status: AC | PRN
  Administered 2019-10-03: 5 mg via INTRAVENOUS

## 2019-10-03 MED ORDER — DEXTROSE 5 % IV SOLN
INTRAVENOUS | Status: AC
  Filled 2019-10-03: qty 3000

## 2019-10-03 MED ORDER — DIBUCAINE (PERIANAL) 1 % EX OINT: 1.0000 "application " | TOPICAL_OINTMENT | CUTANEOUS | Status: DC | PRN

## 2019-10-03 MED ORDER — MORPHINE SULFATE (PF) 0.5 MG/ML IJ SOLN
INTRAMUSCULAR | Status: DC | PRN
  Administered 2019-10-03: 150 ug via INTRATHECAL

## 2019-10-03 MED ORDER — KETOROLAC TROMETHAMINE 30 MG/ML IJ SOLN
INTRAMUSCULAR | Status: DC | PRN
  Administered 2019-10-03: 30 mg via INTRAVENOUS

## 2019-10-03 MED ORDER — LACTATED RINGERS IV SOLN: INTRAVENOUS | Status: DC

## 2019-10-03 MED ORDER — DIPHENHYDRAMINE HCL 25 MG PO CAPS
25.0000 mg | ORAL_CAPSULE | Freq: Four times a day (QID) | ORAL | Status: DC | PRN
  Administered 2019-10-04: 25 mg via ORAL
  Filled 2019-10-03: qty 1

## 2019-10-03 MED ORDER — STERILE WATER FOR IRRIGATION IR SOLN
Status: DC | PRN
  Administered 2019-10-03: 1000 mL

## 2019-10-03 MED ORDER — SCOPOLAMINE 1 MG/3DAYS TD PT72: 1.0000 | MEDICATED_PATCH | Freq: Once | TRANSDERMAL | Status: DC

## 2019-10-03 MED ORDER — NALBUPHINE HCL 10 MG/ML IJ SOLN
INTRAMUSCULAR | Status: AC
  Filled 2019-10-03: qty 1

## 2019-10-03 MED ORDER — TERBUTALINE SULFATE 1 MG/ML IJ SOLN
INTRAMUSCULAR | Status: AC
  Administered 2019-10-03: 0.25 mg via INTRAMUSCULAR
  Filled 2019-10-03: qty 1

## 2019-10-03 MED ORDER — OXYCODONE-ACETAMINOPHEN 5-325 MG PO TABS: 2.0000 | ORAL_TABLET | ORAL | Status: DC | PRN

## 2019-10-03 MED ORDER — WITCH HAZEL-GLYCERIN EX PADS: 1.0000 "application " | MEDICATED_PAD | CUTANEOUS | Status: DC | PRN

## 2019-10-03 MED ORDER — SODIUM CHLORIDE 0.9 % IR SOLN
Status: DC | PRN
  Administered 2019-10-03: 1

## 2019-10-03 MED ORDER — PHENYLEPHRINE HCL-NACL 20-0.9 MG/250ML-% IV SOLN
INTRAVENOUS | Status: DC | PRN
  Administered 2019-10-03: 60 ug/min via INTRAVENOUS

## 2019-10-03 MED ORDER — KETOROLAC TROMETHAMINE 30 MG/ML IJ SOLN: 30.0000 mg | Freq: Four times a day (QID) | INTRAMUSCULAR | Status: DC | PRN

## 2019-10-03 MED ORDER — ACETAMINOPHEN 500 MG PO TABS
1000.0000 mg | ORAL_TABLET | Freq: Three times a day (TID) | ORAL | Status: DC
  Administered 2019-10-03 – 2019-10-05 (×6): 1000 mg via ORAL
  Filled 2019-10-03 (×6): qty 2

## 2019-10-03 MED ORDER — ONDANSETRON HCL 4 MG/2ML IJ SOLN
INTRAMUSCULAR | Status: DC | PRN
  Administered 2019-10-03: 4 mg via INTRAVENOUS

## 2019-10-03 MED ORDER — SIMETHICONE 80 MG PO CHEW: 80.0000 mg | CHEWABLE_TABLET | ORAL | Status: DC | PRN

## 2019-10-03 MED ORDER — NALBUPHINE HCL 10 MG/ML IJ SOLN: 5.0000 mg | Freq: Once | INTRAMUSCULAR | Status: AC | PRN

## 2019-10-03 MED ORDER — ACETAMINOPHEN 500 MG PO TABS: 1000.0000 mg | ORAL_TABLET | Freq: Four times a day (QID) | ORAL | Status: DC

## 2019-10-03 MED ORDER — FLEET ENEMA 7-19 GM/118ML RE ENEM: 1.0000 | ENEMA | Freq: Every day | RECTAL | Status: DC | PRN

## 2019-10-03 MED ORDER — LACTATED RINGERS IV SOLN: 500.0000 mL | INTRAVENOUS | Status: DC | PRN

## 2019-10-03 MED ORDER — NALBUPHINE HCL 10 MG/ML IJ SOLN: 5.0000 mg | INTRAMUSCULAR | Status: DC | PRN

## 2019-10-03 MED ORDER — FENTANYL CITRATE (PF) 100 MCG/2ML IJ SOLN
INTRAMUSCULAR | Status: DC | PRN
Start: 2019-10-03 — End: 2019-10-03
  Administered 2019-10-03: 15 ug via INTRATHECAL

## 2019-10-03 MED ORDER — NALOXONE HCL 0.4 MG/ML IJ SOLN: 0.4000 mg | INTRAMUSCULAR | Status: DC | PRN

## 2019-10-03 MED ORDER — NIFEDIPINE ER OSMOTIC RELEASE 30 MG PO TB24
30.0000 mg | ORAL_TABLET | Freq: Every day | ORAL | Status: DC
  Administered 2019-10-04 – 2019-10-05 (×2): 30 mg via ORAL
  Filled 2019-10-03 (×2): qty 1

## 2019-10-03 MED ORDER — KETOROLAC TROMETHAMINE 30 MG/ML IJ SOLN
30.0000 mg | Freq: Four times a day (QID) | INTRAMUSCULAR | Status: AC
  Administered 2019-10-03 – 2019-10-04 (×3): 30 mg via INTRAVENOUS
  Filled 2019-10-03 (×3): qty 1

## 2019-10-03 MED ORDER — OXYTOCIN 10 UNIT/ML IJ SOLN
INTRAMUSCULAR | Status: DC | PRN
  Administered 2019-10-03: 30 [IU]

## 2019-10-03 MED ORDER — ACETAMINOPHEN 325 MG PO TABS: 650.0000 mg | ORAL_TABLET | ORAL | Status: DC | PRN

## 2019-10-03 MED ORDER — NALOXONE HCL 4 MG/10ML IJ SOLN
1.0000 ug/kg/h | INTRAVENOUS | Status: DC | PRN
  Filled 2019-10-03: qty 5

## 2019-10-03 MED ORDER — SODIUM CHLORIDE 0.9 % IV SOLN: INTRAVENOUS | Status: DC | PRN

## 2019-10-03 MED ORDER — IBUPROFEN 800 MG PO TABS: 800.0000 mg | ORAL_TABLET | Freq: Four times a day (QID) | ORAL | Status: DC

## 2019-10-03 MED ORDER — SODIUM CHLORIDE 0.9% FLUSH: 3.0000 mL | INTRAVENOUS | Status: DC | PRN

## 2019-10-03 MED ORDER — SIMETHICONE 80 MG PO CHEW
80.0000 mg | CHEWABLE_TABLET | ORAL | Status: DC
  Administered 2019-10-04 (×2): 80 mg via ORAL
  Filled 2019-10-03 (×2): qty 1

## 2019-10-03 MED ORDER — PROMETHAZINE HCL 25 MG/ML IJ SOLN: 6.2500 mg | INTRAMUSCULAR | Status: DC | PRN

## 2019-10-03 MED ORDER — KETOROLAC TROMETHAMINE 30 MG/ML IJ SOLN: 30.0000 mg | Freq: Four times a day (QID) | INTRAMUSCULAR | Status: DC

## 2019-10-03 MED ORDER — DEXTROSE 5 % IV SOLN
3.0000 g | INTRAVENOUS | Status: DC
  Filled 2019-10-03 (×2): qty 3000

## 2019-10-03 MED ORDER — DEXTROSE 5 % IV SOLN
INTRAVENOUS | Status: DC | PRN
  Administered 2019-10-03: 3 g via INTRAVENOUS

## 2019-10-03 MED ORDER — MEPERIDINE HCL 25 MG/ML IJ SOLN: 6.2500 mg | INTRAMUSCULAR | Status: DC | PRN

## 2019-10-03 MED ORDER — LACTATED RINGERS IV SOLN: INTRAVENOUS | Status: DC | PRN

## 2019-10-03 MED ORDER — PHENYLEPHRINE HCL-NACL 20-0.9 MG/250ML-% IV SOLN
INTRAVENOUS | Status: AC
  Filled 2019-10-03: qty 250

## 2019-10-03 MED ORDER — OXYCODONE HCL 5 MG PO TABS
5.0000 mg | ORAL_TABLET | ORAL | Status: DC | PRN
  Administered 2019-10-04: 10 mg via ORAL
  Administered 2019-10-04: 5 mg via ORAL
  Administered 2019-10-05: 10 mg via ORAL
  Filled 2019-10-03: qty 2
  Filled 2019-10-03: qty 1
  Filled 2019-10-03: qty 2

## 2019-10-03 MED ORDER — ONDANSETRON HCL 4 MG/2ML IJ SOLN: 4.0000 mg | Freq: Four times a day (QID) | INTRAMUSCULAR | Status: DC | PRN

## 2019-10-03 MED ORDER — OXYTOCIN-SODIUM CHLORIDE 30-0.9 UT/500ML-% IV SOLN
2.5000 [IU]/h | INTRAVENOUS | Status: AC
  Administered 2019-10-03: 2.5 [IU]/h via INTRAVENOUS
  Filled 2019-10-03: qty 500

## 2019-10-03 MED ORDER — COCONUT OIL OIL: 1.0000 "application " | TOPICAL_OIL | Status: DC | PRN

## 2019-10-03 MED ORDER — DIPHENHYDRAMINE HCL 50 MG/ML IJ SOLN: 12.5000 mg | INTRAMUSCULAR | Status: DC | PRN

## 2019-10-03 MED ORDER — FENTANYL CITRATE (PF) 100 MCG/2ML IJ SOLN: 50.0000 ug | INTRAMUSCULAR | Status: DC | PRN

## 2019-10-03 MED ORDER — TETANUS-DIPHTH-ACELL PERTUSSIS 5-2.5-18.5 LF-MCG/0.5 IM SUSP: 0.5000 mL | Freq: Once | INTRAMUSCULAR | Status: DC

## 2019-10-03 MED ORDER — IBUPROFEN 800 MG PO TABS
800.0000 mg | ORAL_TABLET | Freq: Four times a day (QID) | ORAL | Status: DC
  Administered 2019-10-04 – 2019-10-05 (×3): 800 mg via ORAL
  Filled 2019-10-03 (×3): qty 1

## 2019-10-03 SURGICAL SUPPLY — 41 items
APL SKNCLS STERI-STRIP NONHPOA (GAUZE/BANDAGES/DRESSINGS) ×1
BENZOIN TINCTURE PRP APPL 2/3 (GAUZE/BANDAGES/DRESSINGS) ×3 IMPLANT
CHLORAPREP W/TINT 26ML (MISCELLANEOUS) ×3 IMPLANT
CLAMP CORD UMBIL (MISCELLANEOUS) IMPLANT
CLOSURE WOUND 1/2 X4 (GAUZE/BANDAGES/DRESSINGS) ×1
CLOTH BEACON ORANGE TIMEOUT ST (SAFETY) ×3 IMPLANT
DRAPE C SECTION CLR SCREEN (DRAPES) IMPLANT
DRSG OPSITE POSTOP 4X10 (GAUZE/BANDAGES/DRESSINGS) ×3 IMPLANT
ELECT REM PT RETURN 9FT ADLT (ELECTROSURGICAL) ×3
ELECTRODE REM PT RTRN 9FT ADLT (ELECTROSURGICAL) ×1 IMPLANT
EXTRACTOR VACUUM M CUP 4 TUBE (SUCTIONS) IMPLANT
EXTRACTOR VACUUM M CUP 4' TUBE (SUCTIONS)
GLOVE BIO SURGEON STRL SZ7.5 (GLOVE) ×3 IMPLANT
GLOVE BIOGEL PI IND STRL 7.0 (GLOVE) ×1 IMPLANT
GLOVE BIOGEL PI INDICATOR 7.0 (GLOVE) ×2
GOWN STRL REUS W/TWL 2XL LVL3 (GOWN DISPOSABLE) ×3 IMPLANT
GOWN STRL REUS W/TWL LRG LVL3 (GOWN DISPOSABLE) ×6 IMPLANT
KIT ABG SYR 3ML LUER SLIP (SYRINGE) IMPLANT
NDL HYPO 25X5/8 SAFETYGLIDE (NEEDLE) IMPLANT
NEEDLE HYPO 22GX1.5 SAFETY (NEEDLE) ×3 IMPLANT
NEEDLE HYPO 25X5/8 SAFETYGLIDE (NEEDLE) IMPLANT
NS IRRIG 1000ML POUR BTL (IV SOLUTION) ×3 IMPLANT
PACK C SECTION WH (CUSTOM PROCEDURE TRAY) ×3 IMPLANT
PAD OB MATERNITY 4.3X12.25 (PERSONAL CARE ITEMS) ×3 IMPLANT
PENCIL SMOKE EVAC W/HOLSTER (ELECTROSURGICAL) ×3 IMPLANT
RTRCTR C-SECT PINK 25CM LRG (MISCELLANEOUS) ×3 IMPLANT
STRIP CLOSURE SKIN 1/2X4 (GAUZE/BANDAGES/DRESSINGS) ×2 IMPLANT
SUT CHROMIC 1 CTX 36 (SUTURE) ×6 IMPLANT
SUT VIC AB 1 CT1 36 (SUTURE) ×6 IMPLANT
SUT VIC AB 2-0 CT1 (SUTURE) ×3 IMPLANT
SUT VIC AB 2-0 CT1 27 (SUTURE) ×3
SUT VIC AB 2-0 CT1 TAPERPNT 27 (SUTURE) ×1 IMPLANT
SUT VIC AB 3-0 CT1 27 (SUTURE) ×6
SUT VIC AB 3-0 CT1 TAPERPNT 27 (SUTURE) ×2 IMPLANT
SUT VIC AB 3-0 SH 27 (SUTURE)
SUT VIC AB 3-0 SH 27X BRD (SUTURE) IMPLANT
SUT VIC AB 4-0 KS 27 (SUTURE) ×3 IMPLANT
SYR BULB IRRIGATION 50ML (SYRINGE) IMPLANT
TOWEL OR 17X24 6PK STRL BLUE (TOWEL DISPOSABLE) ×3 IMPLANT
TRAY FOLEY W/BAG SLVR 14FR LF (SET/KITS/TRAYS/PACK) ×3 IMPLANT
WATER STERILE IRR 1000ML POUR (IV SOLUTION) ×3 IMPLANT

## 2019-10-03 NOTE — Progress Notes (Addendum)
Labor Progress Note Victoria Robinson is a 34 y.o. [id number] at [redacted]w[redacted]d presented for IOL due to [place].  S: Patient doing well. Reports FM. No contractions, no pain.  O:  BP 125/75   Pulse 77   Temp 98.8 F (37.1 C) (Oral)   Resp 18   Ht 5\' 1"  (1.549 m)   Wt 130.2 kg   LMP 01/11/2019   BMI 54.25 kg/m  EFM: Baseline 135 bpm/moderate variability/accelerations present/deceleratiosn absent. Toco: Absent.  CVE: Dilation: Closed Cervical Position: Posterior Presentation: Transverse Exam by:: Dr. [email or web address]   A&P: 34 y.o. 31 [redacted]w[redacted]d presented for IOL due to cHTN. #Labor: Attempted external cephalic version without success. Counseled on patient on labor options, including attempting external cephalic version once more, inducing labor with potential breech delivery, and cesarean section. Discussed with patient the risks and benefits of each option. Patient and spouse were given time to deliberate on plan moving forward. Patient decided to undergo cesarean section. Risks and benefits of cesarean section, including bleeding, infection, and damage to nearby anatomical structures were discussed with patient who understood and consented to cesarean section. 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 Head noted to be on maternal right, unable to rotate to cephalic despite attempts.  FHR was reactive before and after the procedure.   Pt. Tolerated the procedure well.  Had discussion with patient about options including an additional attempt at ECV, versus induction, versus proceeding with CS. Patient at this time elects to proceed with CS with bilateral salpingectomy.  The risks of surgery were discussed with the patient including but were not limited to: bleeding which may require  transfusion or reoperation; infection which may require antibiotics; injury to bowel, bladder, ureters or other surrounding organs; injury to the fetus; need for additional procedures including hysterectomy in the event of a life-threatening hemorrhage; formation of adhesions; placental abnormalities wth subsequent pregnancies; incisional problems; thromboembolic phenomenon and other postoperative/anesthesia complications.  The patient concurred with the proposed plan, giving informed written consent for the procedure.   Patient has been NPO since 0615 she will remain NPO for procedure. Anesthesia and OR aware. Preoperative prophylactic antibiotics and SCDs ordered on call to the OR.  To OR when ready.   0616, MD  [facility] Family [facility] Fellow, [facility] for [facility], [facility]

## 2019-10-03 NOTE — Transfer of Care (Signed)
Immediate Anesthesia Transfer of Care Note  Patient: Victoria Robinson  Procedure(s) Performed: CESAREAN SECTION (N/A )  Patient Location: PACU  Anesthesia Type:Spinal  Level of Consciousness: awake  Airway & Oxygen Therapy: Patient Spontanous Breathing  Post-op Assessment: Report given to RN and Post -op Vital signs reviewed and stable  Post vital signs: Reviewed and stable  Last Vitals:  Vitals Value Taken Time  BP 111/75 10/03/19 1530  Temp    Pulse 78 10/03/19 1531  Resp 15 10/03/19 1531  SpO2 100 % 10/03/19 1531  Vitals shown include unvalidated device data.  Last Pain:  Vitals:   10/03/19 1347  TempSrc: Oral  PainSc:          Complications: No complications documented.

## 2019-10-03 NOTE — Progress Notes (Signed)
Dr. Alysia Penna at bedside discussing risks and benefits of c-section. Pt verbalizes understanding with no further questions and consents for c-section. FOB at bedside.

## 2019-10-03 NOTE — Anesthesia Preprocedure Evaluation (Addendum)
Anesthesia Evaluation  Patient identified by MRN, date of birth, ID band  Reviewed: Allergy & Precautions, NPO status , Patient's Chart, lab work & pertinent test results  Airway Mallampati: I  TM Distance: >3 FB Neck ROM: Full    Dental no notable dental hx. (+) Teeth Intact   Pulmonary asthma , former smoker,    Pulmonary exam normal breath sounds clear to auscultation       Cardiovascular hypertension, Normal cardiovascular exam Rhythm:Regular Rate:Normal     Neuro/Psych negative neurological ROS  negative psych ROS   GI/Hepatic negative GI ROS, Neg liver ROS,   Endo/Other  Morbid obesity (BMI 54)  Renal/GU negative Renal ROS  negative genitourinary   Musculoskeletal negative musculoskeletal ROS (+)   Abdominal (+) + obese,   Peds negative pediatric ROS (+)  Hematology  (+) anemia ,   Anesthesia Other Findings   Reproductive/Obstetrics (+) Pregnancy                            Anesthesia Physical Anesthesia Plan  ASA: IV  Anesthesia Plan: Spinal   Post-op Pain Management:    Induction:   PONV Risk Score and Plan: 2 and Scopolamine patch - Pre-op, Ondansetron and Treatment may vary due to age or medical condition  Airway Management Planned: Nasal Cannula  Additional Equipment:   Intra-op Plan:   Post-operative Plan:   Informed Consent: I have reviewed the patients History and Physical, chart, labs and discussed the procedure including the risks, benefits and alternatives for the proposed anesthesia with the patient or authorized representative who has indicated his/her understanding and acceptance.       Plan Discussed with:   Anesthesia Plan Comments: (Patient states her last epidural did not work well. Plan for spinal anesthesia with GETA as backup plan. Patient has a reassuring airway. She understands the risks/benefits of both neuraxial and general endotracheal  anesthesia. She wishes to proceed with the plan as outlined. Tanna Furry, MD)       Anesthesia Quick Evaluation

## 2019-10-03 NOTE — H&P (Addendum)
OBSTETRIC ADMISSION HISTORY AND PHYSICAL  Victoria Robinson is a 34 y.o. female [phone] with IUP at [redacted]w[redacted]d by LMP (consistent with [redacted]w[redacted]d [place]) presenting for IOL in the setting of cHTN. She reports +FMs, No LOF, no VB, no blurry vision, headaches or peripheral edema, and RUQ pain.  She plans on breast and bottle feeding. She request BTL for birth control. She received her [facility] at [facility]   Dating: By LMP --->  Estimated Date of Delivery: 10/18/19  Sono:  @[redacted]w[redacted]d , CWD, normal anatomy, transverse with head to maternal right presentation, transverse lie, 2787g, 40% EFW   Prenatal History/Complications:  - Multigravida of advanced maternal age - History of HSV (herpetic whitlow), patient denies history of genital lesions - taking valtrex - Obesity - Chronic hypertension during pregnancy - taking baby aspirin and labetalol 200 mg BID - Short interval between pregnancies affecting pregnancy ( delivered 06/17/18) - Transverse fetal presentation @ [redacted]w[redacted]d [redacted]w[redacted]d.  Past Medical History: Past Medical History:  Diagnosis Date  . Abnormal Pap smear    repeat WNL  . Asthma childhood  . History of pre-eclampsia   . Hypertension   . Shortness of breath     Past Surgical History: Past Surgical History:  Procedure Laterality Date  . APPENDECTOMY      Obstetrical History: OB History    Gravida  5   Para  3   Term  3   Preterm      AB  1   Living  3     SAB  1   TAB      Ectopic      Multiple  0   Live Births  3           Social History Social History   Socioeconomic History  . Marital status: Single    Spouse name: Not on file  . Number of children: Not on file  . Years of education: Not on file  . Highest education level: Not on file  Occupational History  . Not on file  Tobacco Use  . Smoking status: Former Smoker    Packs/day: 0.50    Types: Cigarettes  . Smokeless tobacco: Never Used  Vaping Use  . Vaping Use: Never used  Substance and Sexual  Activity  . Alcohol use: Not Currently    Comment: occasionally   . Drug use: No  . Sexual activity: Yes    Partners: Male    Birth control/protection: None  Other Topics Concern  . Not on file  Social History Narrative  . Not on file   Social Determinants of Health   Financial Resource Strain:   . Difficulty of Paying Living Expenses: Not on file  Food Insecurity:   . Worried About [place] in the Last Year: Not on file  . Ran Out of Food in the Last Year: Not on file  Transportation Needs:   . Lack of Transportation (Medical): Not on file  . Lack of Transportation (Non-Medical): Not on file  Physical Activity:   . Days of Exercise per Week: Not on file  . Minutes of Exercise per Session: Not on file  Stress:   . Feeling of Stress : Not on file  Social Connections:   . Frequency of Communication with Friends and Family: Not on file  . Frequency of Social Gatherings with Friends and Family: Not on file  . Attends Religious Services: Not on file  . Active Member of Clubs or Organizations: Not  on file  . Attends Banker Meetings: Not on file  . Marital Status: Not on file    Family History: Family History  Problem Relation Age of Onset  . Hyperlipidemia Mother   . Hyperlipidemia Father   . Hypertension Father   . Diabetes Maternal Grandmother   . Diabetes Paternal Grandmother     Allergies: No Known Allergies  Medications Prior to Admission  Medication Sig Dispense Refill Last Dose  . acetaminophen (TYLENOL) 325 MG tablet Take 650 mg by mouth every 6 (six) hours as needed for moderate pain.      Marland Kitchen aspirin EC 81 MG tablet Take 1 tablet (81 mg total) by mouth daily. Take after 12 weeks for prevention of preeclampsia later in pregnancy 300 tablet 2   . labetalol (NORMODYNE) 200 MG tablet Take 1 tablet (200 mg total) by mouth 2 (two) times daily. 60 tablet 3   . ondansetron (ZOFRAN ODT) 4 MG disintegrating tablet Take 1 tablet (4 mg total) by  mouth every 6 (six) hours as needed for nausea. (Patient not taking: Reported on 08/09/2019) 6 tablet 0   . Prenatal Vit w/Fe-Methylfol-FA (PNV PO) Take 1 tablet by mouth daily.      . valACYclovir (VALTREX) 500 MG tablet Take 1 tablet (500 mg total) by mouth daily. 60 tablet 0      Review of Systems   All systems reviewed and negative except as stated in HPI  Last menstrual period 01/11/2019, currently breastfeeding. General appearance: alert, cooperative and no distress Lungs: clear to auscultation bilaterally Heart: regular rate and rhythm Abdomen: soft, non-tender; bowel sounds normal Pelvic: cervical os closed. No evidence of lesions over the external genitalia, speculum exam did not reveal and cervical or vaginal lesions. Extremities: Homans sign is negative, no sign of DVT Presentation: transverse with fetal head to maternal left Fetal monitoringBaseline: 135 bpm, Variability: Good {> 6 bpm), Accelerations: Reactive and Decelerations: Absent Uterine activity: None     Prenatal labs: ABO, Rh: O/Positive/-- (02/23 1639) Antibody: Negative (02/23 1639) Rubella: 5.52 (02/23 1639) RPR: Non Reactive (06/28 1020)  HBsAg: Negative (02/23 1639)  HIV: Non Reactive (06/28 1020)  GBS: Negative/-- (08/17 0343)  1 hr Glucola: Third trimester normal 2 hour GTT Genetic screening  NIPS - low risk. AFP - negative. Anatomy US Normal.  Prenatal Transfer Tool  Maternal Diabetes: No Genetic Screening: Abnormal:  Results: Other: NIPS - low risk. Maternal Ultrasounds/Referrals: Normal Fetal Ultrasounds or other Referrals:  Referred to Materal Fetal Medicine  Maternal Substance Abuse:  No Significant Maternal Medications:  Meds include: Other: Labetalol. Valtrex prophylaxis Significant Maternal Lab Results: Group B Strep negative  No results found for this or any previous visit (from the past 24 hour(s)).  Patient Active Problem List   Diagnosis Date Noted  . Vertex presentation of fetus  09/27/2019  . AMA (advanced maternal age) multigravida 35+ 05/26/2019  . Short interval between pregnancies affecting pregnancy, antepartum 03/29/2019  . History of gestational hypertension 03/29/2019  . Supervision of other normal pregnancy, antepartum 03/29/2019  . Chronic hypertension during pregnancy, antepartum 03/29/2019  . Herpetic whitlow 12/09/2017  . Obesity 12/27/2012    Assessment/Plan:  ANGELIYAH KIRKEY is a 34 y.o. [id number] at [redacted]w[redacted]d here for IOL secondary to [facility].  #Labor: Fetal presentation transverse with fetal head to the maternal left. Confirmed with bedside US. Will attempt to perform external cephalic version.  Defer IOL for now.  #Hx of HSV: no history of genital lesions ever per pt.  No evidence of external or internal lesions on pelvic exam. #Pain: IV Fentanyl, would like to avoid epidural. #FWB: Category I #ID:  GBS Negative #MOF: Breast and Bottle #MOC: BTL  Eliezer Lofts, Medical Student  10/03/2019, 7:41 AM   FPTS Upper-Level Resident Addendum   I have independently interviewed and examined the patient. I have discussed the above with the original author and agree with their documentation.    Mirian Mo MD PGY-3, [facility] 10/03/2019 9:34 AM    I saw and evaluated the patient. I agree with the findings and the plan of care as documented in the medical student's note.  Casper Harrison, MD [facility] Family [facility] Fellow, [facility] for [facility], [facility]

## 2019-10-03 NOTE — Anesthesia Procedure Notes (Signed)
Spinal  Patient location during procedure: OR Start time: 10/03/2019 2:12 PM End time: 10/03/2019 2:07 PM Staffing Performed: anesthesiologist  Anesthesiologist: Mellody Dance, MD Preanesthetic Checklist Completed: patient identified, IV checked, risks and benefits discussed, surgical consent, monitors and equipment checked, pre-op evaluation and timeout performed Spinal Block Patient position: sitting Prep: DuraPrep Patient monitoring: cardiac monitor, continuous pulse ox and blood pressure Approach: midline Location: L3-4 Injection technique: single-shot Needle Needle type: Sprotte  Needle gauge: 24 G Needle length: 9 cm Additional Notes Functioning IV was confirmed and monitors were applied. Sterile prep and drape, including hand hygiene and sterile gloves were used. The patient was positioned and the spine was prepped. The skin was anesthetized with lidocaine.  Free flow of clear CSF was obtained prior to injecting local anesthetic into the CSF.  The spinal needle aspirated freely following injection.  The needle was carefully withdrawn.  The patient tolerated the procedure well.

## 2019-10-03 NOTE — Brief Op Note (Cosign Needed)
  Cesarean Section Procedure Note  10/03/2019  3:27 PM  PATIENT:  Victoria Robinson  34 y.o. female  PRE-OPERATIVE DIAGNOSIS:  Primary ceserean section for malpresentation  and bilateral salpingectomy for infertility  POST-OPERATIVE DIAGNOSIS:  Primary ceserean section for malpresentation  and bilateral salpingectomy for infertility  PROCEDURE:  Procedure(s): CESAREAN SECTION (N/A)  SURGEON:  Surgeon(s) and Role:    * Hermina Staggers, MD - Primary    * Assistant: Gita Kudo, MD - Fellow    ANESTHESIA:   spinal  EBL:  Total I/O In: 2000 [I.V.:2000] Out: 1133 [Urine:100; Blood:1033]  BLOOD ADMINISTERED:none  DRAINS: none   LOCAL MEDICATIONS USED:  NONE  Specimens: Right and left fallopian tube, placenta.    Procedure Details   The patient was seen in the Holding Room. The risks, benefits, complications, treatment options, and expected outcomes were discussed with the patient.  The patient concurred with the proposed plan, giving informed consent.  The site of surgery properly noted/marked. The patient was taken to Operating Room, identified as Victoria Robinson and the procedure verified as C-Section Delivery. A Time Out was held and the above information confirmed.  After induction of anesthesia, the patient was draped and prepped in the usual sterile manner. A Pfannenstiel incision was made and carried down through the subcutaneous tissue to the fascia. Fascial incision was made and extended transversely. The fascia was separated from the underlying rectus tissue superiorly and inferiorly. The peritoneum was identified and entered. Peritoneal incision was extended longitudinally. The utero-vesical peritoneal reflection was incised transversely and the bladder flap was bluntly freed from the lower uterine segment. A low transverse uterine incision was made. Delivered from transverse presentation was a Female with Apgar scores of 8 at one minute and 9 at five minutes.  After the umbilical cord was clamped and cut cord blood was obtained for evaluation. The placenta was removed intact and appeared normal. The uterine outline, tubes and ovaries appeared normal. The uterine incision was closed with running locked sutures of Chromic. Hemostasis was observed.  Attention was then turned to the right fallopian tube which was double clamped with Kelly's, tied and subsequently excised with excellent hemostasis. Attention was then turned to the left fallopian tube which was double clamped, tied, and excised in similar fashion with excellent hemostasis.    Lavage was carried out until clear. The fascia was then reapproximated with running sutures of Vicryl. The skin was reapproximated with Vicryl.  Instrument, sponge, and needle counts were correct prior the abdominal closure and at the conclusion of the case.   Complications:  None; patient tolerated the procedure well.  COUNTS:  YES  PLAN OF CARE: Admit to inpatient   PATIENT DISPOSITION:  PACU - hemodynamically stable.   Delay start of Pharmacological VTE agent (>24hrs) due to surgical blood loss or risk of bleeding: not applicable             Disposition: PACU - hemodynamically stable.         Condition: stable   Gita Kudo, MD 10/03/2019 3:27 PM

## 2019-10-03 NOTE — Anesthesia Postprocedure Evaluation (Signed)
Anesthesia Post Note  Patient: Victoria Robinson  Procedure(s) Performed: CESAREAN SECTION (N/A )     Patient location during evaluation: Women's Unit Anesthesia Type: Spinal Level of consciousness: awake and alert Pain management: pain level controlled Vital Signs Assessment: post-procedure vital signs reviewed and stable Respiratory status: spontaneous breathing and respiratory function stable Cardiovascular status: blood pressure returned to baseline and stable Postop Assessment: spinal receding Anesthetic complications: no   No complications documented.  Last Vitals:  Vitals:   10/03/19 1652 10/03/19 1653  BP:    Pulse: 72 80  Resp: (!) 25 (!) 21  Temp:    SpO2: 99% 100%    Last Pain:  Vitals:   10/03/19 1645  TempSrc: Oral  PainSc: 3    Pain Goal:                Epidural/Spinal Function Cutaneous sensation: Tingles (10/03/19 1645), Patient able to flex knees: Yes (10/03/19 1645), Patient able to lift hips off bed: No (10/03/19 1645), Back pain beyond tenderness at insertion site: No (10/03/19 1645), Progressively worsening motor and/or sensory loss: No (10/03/19 1645), Bowel and/or bladder incontinence post epidural: No (10/03/19 1645)  Mellody Dance

## 2019-10-03 NOTE — Progress Notes (Signed)
Patient ID: Victoria Robinson, female   DOB: 01/07/1986, 34 y.o.   MRN: [id number] The risks of cesarean section discussed with the patient included but were not limited to: bleeding which may require transfusion or reoperation; infection which may require antibiotics; injury to bowel, bladder, ureters or other surrounding organs; injury to the fetus; need for additional procedures including hysterectomy in the event of a life-threatening hemorrhage; placental abnormalities wth subsequent pregnancies, incisional problems, thromboembolic phenomenon and other postoperative/anesthesia complications. The patient concurred with the proposed plan, giving informed written consent for the procedure.   Anesthesia and OR aware. Preoperative prophylactic antibiotics and SCDs ordered on call to the OR.  To OR when ready.  Patient desires bilateral tubal sterilization.  Other reversible forms of contraception were discussed with patient; she declines all other modalities. Discussed bilateral tubal sterilization in detail; discussed options of laparoscopic bilateral tubal sterilization using Filshie clips vs laparoscopic bilateral salpingectomy. Risks and benefits discussed in detail including but not limited to: risk of regret, permanence of method, bleeding, infection, injury to surrounding organs and need for additional procedures.  Failure risk of 1-2 % for Filshie clips and <1% for bilateral salpingectomy with increased risk of ectopic gestation if pregnancy occurs was also discussed with patient.  Also discussed possible reduction of risk of ovarian cancer via bilateral salpingectomy given that a growing body of knowledge reveals that the majority of cases of high grade serous "ovarian" cancer actually are actually  cancers arising from the fimbriated end of the fallopian tubes. Emphasized that removal of fallopian tubes do not result in any known hormonal imbalance.  Patient verbalized understanding of these risks and  benefits and wants to proceed with sterilization via salpingectomy.    Nettie Elm, MD

## 2019-10-03 NOTE — Progress Notes (Deleted)
Patient ID: Victoria Robinson, female   DOB: 1985-09-06, 34 y.o.   MRN: [id number] The risks of cesarean section discussed with the patient included but were not limited to: bleeding which may require transfusion or reoperation; infection which may require antibiotics; injury to bowel, bladder, ureters or other surrounding organs; injury to the fetus; need for additional procedures including hysterectomy in the event of a life-threatening hemorrhage; placental abnormalities wth subsequent pregnancies, incisional problems, thromboembolic phenomenon and other postoperative/anesthesia complications. The patient concurred with the proposed plan, giving informed written consent for the procedure.   Anesthesia and OR aware. Preoperative prophylactic antibiotics and SCDs ordered on call to the OR.  To OR when ready.   Patient desires permanent sterilization.  Other reversible forms of contraception were discussed with patient; she declines all other modalities. Risks of procedure discussed with patient including but not limited to: risk of regret, permanence of method, bleeding, infection, injury to surrounding organs and need for additional procedures. Bilateral salpingectomy reviewed with pt.   Failure risk of 1-2 % with increased risk of ectopic gestation if pregnancy occurs was also discussed with patient.  Patient verbalized understanding of these risks and wants to proceed with sterilization via bilateral salpingectomy.  Delano Frate L. Alysia Penna, MD

## 2019-10-03 NOTE — Discharge Summary (Addendum)
Postpartum Discharge Summary     Patient Name: Victoria Robinson DOB: July 05, 1985 MRN: [id number]  Date of admission: 10/03/2019 Delivery date:10/03/2019  Delivering provider: Chancy Milroy  Date of discharge: 10/05/2019  Admitting diagnosis: Encounter for induction of labor [Z34.90] Intrauterine pregnancy: [redacted]w[redacted]d    Secondary diagnosis:  Active Problems:   Short interval between pregnancies affecting pregnancy, antepartum   History of gestational hypertension   Supervision of other normal pregnancy, antepartum   Chronic hypertension during pregnancy, antepartum   AMA (advanced maternal age) multigravida 35+   History of herpes genitalis   Encounter for induction of labor  Additional problems: n/a    Discharge diagnosis: Term Pregnancy Delivered                                              Post partum procedures: none Augmentation: N/A Complications: None  Hospital course: Sceduled C/S   34y.o. yo [id number] admitted to the hospital 10/03/2019 for scheduled cesarean section with the following indication:Elective Repeat and due to Malpresentation with unstable lie, chronic hypertension. Delivery details are as follows:  Membrane Rupture Time/Date: 2:30 PM ,10/03/2019   Delivery Method:C-Section, Low Transverse    Details of operation can be found in separate operative note.  Patient had an uncomplicated postpartum course. Her pregnancy was complicated by chronic hypertension and transitioned to procardia postpartum. Blood pressure remaining wnl at discharge (systolics 13937J She is ambulating, tolerating a regular diet, passing flatus, and urinating well. Patient is discharged home in stable condition on  10/05/19.        Newborn Data: Birth date:10/03/2019  Birth time:2:31 PM  Gender:Female  Living status:Living  Apgars:8 ,9  Weight:2948 g     Magnesium Sulfate received: No BMZ received: No Rhophylac:N/A MMR:No T-DaP:Given prenatally Flu:  No Transfusion:No  Physical exam  Vitals:   10/04/19 0230 10/04/19 0530 10/04/19 1005 10/04/19 2012  BP: 133/72  132/84 136/76  Pulse: 83  87 86  Resp: '18 19  18  ' Temp: 98.2 F (36.8 C) 98 F (36.7 C)  98.4 F (36.9 C)  TempSrc: Oral Oral  Oral  SpO2: 99% 98%  100%  Weight:      Height:       General: alert, cooperative and no distress Lochia: appropriate Uterine Fundus: firm Incision: Healing well with no significant drainage, honeycomb in place DVT Evaluation: No evidence of DVT seen on physical exam. Labs: Lab Results  Component Value Date   WBC 5.9 10/04/2019   HGB 9.7 (L) 10/04/2019   HCT 30.4 (L) 10/04/2019   MCV 96.5 10/04/2019   PLT 177 10/04/2019   CMP Latest Ref Rng & Units 10/04/2019  Glucose 70 - 99 mg/dL -  BUN 6 - 20 mg/dL -  Creatinine 0.44 - 1.00 mg/dL 0.81  Sodium 135 - 145 mmol/L -  Potassium 3.5 - 5.1 mmol/L -  Chloride 98 - 111 mmol/L -  CO2 22 - 32 mmol/L -  Calcium 8.9 - 10.3 mg/dL -  Total Protein 6.5 - 8.1 g/dL -  Total Bilirubin 0.3 - 1.2 mg/dL -  Alkaline Phos 38 - 126 U/L -  AST 15 - 41 U/L -  ALT 0 - 44 U/L -   Edinburgh Score: Edinburgh Postnatal Depression Scale Screening Tool 10/04/2019  I have been able to laugh and see the funny side of things.  0  I have looked forward with enjoyment to things. 0  I have blamed myself unnecessarily when things went wrong. 1  I have been anxious or worried for no good reason. 1  I have felt scared or panicky for no good reason. 0  Things have been getting on top of me. 0  I have been so unhappy that I have had difficulty sleeping. 0  I have felt sad or miserable. 0  I have been so unhappy that I have been crying. 0  The thought of harming myself has occurred to me. 0  Edinburgh Postnatal Depression Scale Total 2  Some encounter information is confidential and restricted. Go to Review Flowsheets activity to see all data.     After visit meds:  Allergies as of 10/05/2019   No Known  Allergies      Medication List     STOP taking these medications    aspirin EC 81 MG tablet   labetalol 200 MG tablet Commonly known as: NORMODYNE   ondansetron 4 MG disintegrating tablet Commonly known as: Zofran ODT   valACYclovir 500 MG tablet Commonly known as: Valtrex       TAKE these medications    acetaminophen 325 MG tablet Commonly known as: TYLENOL Take 2 tablets (650 mg total) by mouth every 6 (six) hours. What changed:  when to take this reasons to take this   coconut oil Oil Apply 1 application topically as needed (nipple pain).   ferrous gluconate 324 MG tablet Commonly known as: FERGON Take 1 tablet (324 mg total) by mouth every other day. Start taking on: October 06, 2019   ibuprofen 800 MG tablet Commonly known as: ADVIL Take 1 tablet (800 mg total) by mouth every 8 (eight) hours.   NIFEdipine 30 MG 24 hr tablet Commonly known as: ADALAT CC Take 1 tablet (30 mg total) by mouth daily.   oxyCODONE 5 MG immediate release tablet Commonly known as: Oxy IR/ROXICODONE Take 1 tablet (5 mg total) by mouth every 6 (six) hours as needed for severe pain or breakthrough pain.   PNV PO Take 1 tablet by mouth daily.               Discharge Care Instructions  (From admission, onward)           Start     Ordered   10/05/19 0000  Leave dressing on - Keep it clean, dry, and intact until clinic visit        10/05/19 0821             Discharge home in stable condition Infant Feeding: Breast Infant Disposition:home with mother Discharge instruction: per After Visit Summary and Postpartum booklet. Activity: Advance as tolerated. Pelvic rest for 6 weeks.  Diet: routine diet Future Appointments: Future Appointments  Date Time Provider [place]  10/11/2019  1:20 PM [place] None  10/31/2019  2:15 PM Constant, Vickii Chafe, MD [place] None   Follow up Visit:  Please schedule this patient for a In person postpartum visit  in 6 weeks with the following provider: Any provider. Additional Postpartum F/U:Incision check 1 week and BP check 1 week  High risk pregnancy complicated by: HTN Delivery mode:  C-Section, Low Transverse  Anticipated Birth Control:  BTL done PP  10/05/2019 Patriciaann Clan, DO  Attestation of Supervision of Student:  I confirm that I have verified the information documented in the  resident's note and that I have also personally reperformed the  history, physical exam and all medical decision making activities.  I have verified that all services and findings are accurately documented in this student's note; and I agree with management and plan as outlined in the documentation. I have also made any necessary editorial changes.  Randa Ngo, [place] for [facility], [facility] 10/05/2019 8:59 AM

## 2019-10-04 LAB — CREATININE, SERUM
Creatinine, Ser: 0.81 mg/dL (ref 0.44–1.00)
GFR calc Af Amer: >60 mL/min (ref >60)
GFR calc non Af Amer: >60 mL/min (ref >60)

## 2019-10-04 LAB — CBC
HCT: 30.4 % — ABNORMAL LOW (ref 36.0–46.0)
Hemoglobin: 9.7 g/dL — ABNORMAL LOW (ref 12.0–15.0)
MCH: 30.8 pg (ref 26.0–34.0)
MCHC: 31.9 g/dL (ref 30.0–36.0)
MCV: 96.5 fL (ref 80.0–100.0)
Platelets: 177 10*3/uL (ref 150–400)
RBC: 3.15 MIL/uL — ABNORMAL LOW (ref 3.87–5.11)
RDW: 13.4 % (ref 11.5–15.5)
WBC: 5.9 10*3/uL (ref 4.0–10.5)
nRBC: 0 % (ref 0.0–0.2)

## 2019-10-04 MED ORDER — VITAMIN C 250 MG PO TABS
250.0000 mg | ORAL_TABLET | ORAL | Status: DC
  Administered 2019-10-04: 250 mg via ORAL
  Filled 2019-10-04: qty 1

## 2019-10-04 MED ORDER — FERROUS GLUCONATE 324 (38 FE) MG PO TABS
324.0000 mg | ORAL_TABLET | ORAL | Status: DC
  Administered 2019-10-04: 324 mg via ORAL
  Filled 2019-10-04: qty 1

## 2019-10-04 NOTE — Lactation Note (Signed)
This note was copied from a baby's chart. Lactation Consultation Note  Patient Name: Victoria Robinson Date: 10/04/2019 Reason for consult: Initial assessment   Mother is a P4 , infant is 36 hours old Mother reports that she breastfed other children for 3 months and then pumped for 6 months.  Mother was given [facility] brochure and basic teaching done.   Mother reports that infant is breastfeeding but mostly bottle feeding.mother reports that she needs shells. Mother reports that she wears shells each time she has her children. She reports that her nipples are flat.   Observed mothers nipples to be large and have areola edema.  Assist with hand expression and only obtained tiny drops of colostrum. Mother put on her bra and shells were given.   Mother reports she would like to wear shells for a while and then try to breast feed.  Infants last 3 feedings have been formula. Infant sleeping in crib.   Mother would like to page for [facility] to come back when infant is ready for a feeding. .    She is active with WIC . Mother has a DEBP sat up at the bedside.   Mother to continue to cue base feed infant and feed at least 8-12 times or more in 24 hours and advised to allow for cluster feeding infant as needed.  Mother to continue to due STS. Mother is aware of available LC services at [facility], BFSG'S, OP Dept, and phone # for questions or concerns about breastfeeding.  Mother receptive to all teaching and plan of care.     Maternal Data Has patient been taught Hand Expression?: Yes Does the patient have breastfeeding experience prior to this delivery?: Yes  Feeding    LATCH Score                   Interventions Interventions: Breast feeding basics reviewed;Expressed milk;Shells;Hand pump;DEBP  Lactation Tools Discussed/Used WIC Program: Yes Pump Review: Setup, frequency, and cleaning;Milk Storage Initiated by:: staff member Date initiated:: 10/04/19   Consult  Status Consult Status: Follow-up Date: 10/04/19 Follow-up type: In-patient    Stevan Born [facility] 10/04/2019, 2:49 PM

## 2019-10-04 NOTE — Lactation Note (Signed)
This note was copied from a baby's chart. Lactation Consultation Note Baby 13 hrs old. Mom resting. Mom would like LC to come back early am.  Patient Name: Girl Eustolia Drennen SAYTK'Z Date: 10/04/2019     Maternal Data    Feeding Feeding Type: Formula  LATCH Score                   Interventions    Lactation Tools Discussed/Used     Consult Status      Charyl Dancer 10/04/2019, 4:13 AM

## 2019-10-04 NOTE — Progress Notes (Signed)
Subjective: POD#1: Cesarean Delivery  Patient is doing well without complaints. Ambulating without difficulty. Foley catheter just removed, has not yet voided. passing flatus. Tolerating PO. Abdominal pain improved. Vaginal bleeding decreased.  Objective: Vital signs in last 24 hours: Temp:  [98 F (36.7 C)-98.8 F (37.1 C)] 98 F (36.7 C) (08/31 0530) Pulse Rate:  [1-87] 83 (08/31 0230) Resp:  [9-25] 19 (08/31 0530) BP: (87-141)/(70-82) 133/72 (08/31 0230) SpO2:  [92 %-100 %] 98 % (08/31 0530) Weight:  [130.2 kg] 130.2 kg (08/30 4628)  Physical Exam:  General: alert, cooperative and no distress Lochia: appropriate Uterine Fundus: firm Incision: dressing c/d/i DVT Evaluation: No evidence of DVT seen on physical exam.  Recent Labs    10/03/19 0813 10/04/19 0435  HGB 10.8* 9.7*  HCT 33.5* 30.4*    Assessment/Plan: POD#1 rLTCS for unstable fetal lie +BTL  -Doing well without issues  -Needs to void post foley  -Working on breast feeding, no supply yet, supplementing  -hgb 10.8>>9.7, PO iron/vit c started  #cHTN  -switched from labetalol to procardia 30 mg XL daily post partum  -asymptomatic  -BP stable  -PIH labs nml  Plan for discharge tomorrow or Thursday.    Alric Seton 10/04/2019, 7:07 AM

## 2019-10-05 LAB — SURGICAL PATHOLOGY

## 2019-10-05 MED ORDER — IBUPROFEN 800 MG PO TABS
800.0000 mg | ORAL_TABLET | Freq: Three times a day (TID) | ORAL | 0 refills | Status: DC
Start: 2019-10-05 — End: 2019-10-06

## 2019-10-05 MED ORDER — NIFEDIPINE ER 30 MG PO TB24: 30.0000 mg | ORAL_TABLET | Freq: Every day | ORAL | 1 refills | Status: AC

## 2019-10-05 MED ORDER — OXYCODONE HCL 5 MG PO TABS
5.0000 mg | ORAL_TABLET | Freq: Four times a day (QID) | ORAL | 0 refills | Status: AC | PRN
Start: 2019-10-05 — End: ?

## 2019-10-05 MED ORDER — COCONUT OIL OIL: 1.0000 "application " | TOPICAL_OIL | 0 refills | Status: AC | PRN

## 2019-10-05 MED ORDER — FERROUS GLUCONATE 324 (38 FE) MG PO TABS: 324.0000 mg | ORAL_TABLET | ORAL | 3 refills | Status: AC

## 2019-10-05 MED ORDER — ACETAMINOPHEN 325 MG PO TABS: 650.0000 mg | ORAL_TABLET | Freq: Four times a day (QID) | ORAL | Status: AC

## 2019-10-05 MED FILL — oxyCODONE HCL 5 MG TABS: 5 | 3 days supply | Qty: 10 | Fill #0

## 2019-10-05 MED FILL — NIFEdipine ER 30 MG TB24: 30 | 45 days supply | Qty: 45 | Fill #0

## 2019-10-05 MED FILL — FERROUS GLUCONATE 324 MG TA: 324 (38 FE) | 60 days supply | Qty: 60 | Fill #0

## 2019-10-05 NOTE — Discharge Instructions (Signed)

## 2019-10-05 NOTE — Lactation Note (Signed)
This note was copied from a baby's chart. Lactation Consultation Note  Patient Name: Victoria Robinson YYFRT'M Date: 10/05/2019 Reason for consult: Follow-up assessment   P4, Baby 45 hours old and is primarily being formula fed. Mother is pumping and has DEBP at home. Feed on demand with cues.  Goal 8-12+ times per day after first 24 hrs.  Place baby STS if not cueing.  Reviewed engorgement care and monitoring voids/stools.    Maternal Data    Feeding Feeding Type: Bottle Fed - Formula Nipple Type: Slow - flow  LATCH Score                   Interventions Interventions: Breast feeding basics reviewed;DEBP  Lactation Tools Discussed/Used     Consult Status Consult Status: Complete Date: 10/05/19    Dahlia Byes [facility] 10/05/2019, 11:39 AM

## 2019-10-06 ENCOUNTER — Other Ambulatory Visit: Payer: Self-pay | Admitting: *Deleted

## 2019-10-06 MED ORDER — IBUPROFEN 800 MG PO TABS
800.0000 mg | ORAL_TABLET | Freq: Three times a day (TID) | ORAL | 0 refills | Status: DC
Start: 2019-10-06 — End: 2019-10-06

## 2019-10-06 MED ORDER — IBUPROFEN 800 MG PO TABS: 800.0000 mg | ORAL_TABLET | Freq: Three times a day (TID) | ORAL | 0 refills | Status: AC

## 2019-10-06 NOTE — Progress Notes (Signed)
Ibuprofen was not received by pharmacy. Was reordered today as previously sent.

## 2019-10-11 ENCOUNTER — Ambulatory Visit (INDEPENDENT_AMBULATORY_CARE_PROVIDER_SITE_OTHER): Payer: BC Managed Care – PPO

## 2019-10-11 VITALS — BP 122/82 | HR 96 | Wt 272.0 lb

## 2019-10-11 DIAGNOSIS — Z0130 Encounter for examination of blood pressure without abnormal findings: Secondary | ICD-10-CM

## 2019-10-11 DIAGNOSIS — R300 Dysuria: Secondary | ICD-10-CM

## 2019-10-11 LAB — POCT URINALYSIS DIPSTICK
Bilirubin, UA: NEGATIVE
Glucose, UA: NEGATIVE
Ketones, UA: POSITIVE
Nitrite, UA: NEGATIVE
Protein, UA: NEGATIVE
Spec Grav, UA: 1.025 (ref 1.010–1.025)
Urobilinogen, UA: 0.2 E.U./dL
pH, UA: 5 (ref 5.0–8.0)

## 2019-10-11 MED ORDER — NITROFURANTOIN MONOHYD MACRO 100 MG PO CAPS: 100.0000 mg | ORAL_CAPSULE | Freq: Two times a day (BID) | ORAL | 0 refills | Status: DC

## 2019-10-11 NOTE — Progress Notes (Signed)
Subjective:  Victoria Robinson is a 34 y.o. female 1 Week PP here for BP check and Incision check.  Hypertension ROS: taking medications as instructed, no medication side effects noted, no TIA's, no chest pain on exertion, no dyspnea on exertion and no swelling of ankles.   Incision is healing well, honeycomb bandage removed today. No open areas , redness or drainage noted.   Objective:  BP 122/82   Pulse 96   Wt 272 lb (123.4 kg)   LMP 01/11/2019   Breastfeeding Yes   BMI 51.39 kg/m   Appearance alert, well appearing, and in no distress. General exam BP noted to be well controlled today in office.    Assessment:   Blood Pressure well controlled.  complains of dysuria, urinary frequency CS Incision is healing well.  Plan:  Current treatment plan is effective, no change in therapy.  Keep PP appt. Urine Culture sent to lab Macrobid 100 mg sent to Pharmacy.

## 2019-10-11 NOTE — Progress Notes (Signed)
Patient was assessed and managed by nursing staff during this encounter. I have reviewed the chart and agree with the documentation and plan. I have also made any necessary editorial changes.  Jaynie Collins, MD 10/11/2019 2:47 PM

## 2019-10-13 LAB — URINE CULTURE

## 2019-10-24 ENCOUNTER — Telehealth: Payer: Self-pay

## 2019-10-24 NOTE — Telephone Encounter (Signed)
Spoke with patient I have advised her to keep her incision are clean and to put a maxi pad or guaze where the incision is open. She has a follow up on 10/26/2019

## 2019-10-31 ENCOUNTER — Ambulatory Visit: Payer: BC Managed Care – PPO | Admitting: Obstetrics and Gynecology

## 2019-11-15 ENCOUNTER — Ambulatory Visit (INDEPENDENT_AMBULATORY_CARE_PROVIDER_SITE_OTHER): Payer: BC Managed Care – PPO | Admitting: Women's Health

## 2019-11-15 ENCOUNTER — Other Ambulatory Visit: Payer: Self-pay

## 2019-11-15 ENCOUNTER — Encounter: Payer: Self-pay | Admitting: Women's Health

## 2019-11-15 VITALS — BP 131/98 | HR 85 | Ht 61.0 in | Wt 258.6 lb

## 2019-11-15 DIAGNOSIS — Z8619 Personal history of other infectious and parasitic diseases: Secondary | ICD-10-CM

## 2019-11-15 DIAGNOSIS — Z23 Encounter for immunization: Secondary | ICD-10-CM | POA: Diagnosis not present

## 2019-11-15 DIAGNOSIS — R4586 Emotional lability: Secondary | ICD-10-CM | POA: Diagnosis not present

## 2019-11-15 MED ORDER — VALACYCLOVIR HCL 500 MG PO TABS: 500.0000 mg | ORAL_TABLET | Freq: Two times a day (BID) | ORAL | 0 refills | Status: AC
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
## 2019-11-15 NOTE — Patient Instructions (Addendum)
Postpartum Baby Blues The postpartum period begins right after the birth of a baby. During this time, there is often a lot of joy and excitement. It is also a time of many changes in the life of the parents. No matter how many times a mother gives birth, each child brings new challenges to the family, including different ways of relating to one another. It is common to have feelings of excitement along with confusing changes in moods, emotions, and thoughts. You may feel happy one minute and sad or stressed the next. These feelings of sadness usually happen in the period right after you have your baby, and they go away within a week or two. This is called the "baby blues." What are the causes? There is no known cause of baby blues. It is likely caused by a combination of factors. However, changes in hormone levels after childbirth are believed to trigger some of the symptoms. Other factors that can play a role in these mood changes include:  Lack of sleep.  Stressful life events, such as poverty, caring for a loved one, or death of a loved one.  Genetics. What are the signs or symptoms? Symptoms of this condition include:  Brief changes in mood, such as going from extreme happiness to sadness.  Decreased concentration.  Difficulty sleeping.  Crying spells and tearfulness.  Loss of appetite.  Irritability.  Anxiety. If the symptoms of baby blues last for more than 2 weeks or become more severe, you may have postpartum depression. How is this diagnosed? This condition is diagnosed based on an evaluation of your symptoms. There are no medical or lab tests that lead to a diagnosis, but there are various questionnaires that a health care provider may use to identify women with the baby blues or postpartum depression. How is this treated? Treatment is not needed for this condition. The baby blues usually go away on their own in 1-2 weeks. Social support is often all that is needed. You will  be encouraged to get adequate sleep and rest. Follow these instructions at home: Lifestyle      Get as much rest as you can. Take a nap when the baby sleeps.  Exercise regularly as told by your health care provider. Some women find yoga and walking to be helpful.  Eat a balanced and nourishing diet. This includes plenty of fruits and vegetables, whole grains, and lean proteins.  Do little things that you enjoy. Have a cup of tea, take a bubble bath, read your favorite magazine, or listen to your favorite music.  Avoid alcohol.  Ask for help with household chores, cooking, grocery shopping, or running errands. Do not try to do everything yourself. Consider hiring a postpartum doula to help. This is a professional who specializes in providing support to new mothers.  Try not to make any major life changes during pregnancy or right after giving birth. This can add stress. General instructions  Talk to people close to you about how you are feeling. Get support from your partner, family members, friends, or other new moms. You may want to join a support group.  Find ways to cope with stress. This may include: ? Writing your thoughts and feelings in a journal. ? Spending time outside. ? Spending time with people who make you laugh.  Try to stay positive in how you think. Think about the things you are grateful for.  Take over-the-counter and prescription medicines only as told by your health care provider.    Let your health care provider know if you have any concerns.  Keep all postpartum visits as told by your health care provider. This is important. Contact a health care provider if:  Your baby blues do not go away after 2 weeks. Get help right away if:  You have thoughts of taking your own life (suicidal thoughts).  You think you may harm the baby or other people.  You see or hear things that are not there (hallucinations). Summary  After giving birth, you may feel happy  one minute and sad or stressed the next. Feelings of sadness that happen right after the baby is born and go away after a week or two are called the "baby blues."  You can manage the baby blues by getting enough rest, eating a healthy diet, exercising, spending time with supportive people, and finding ways to cope with stress.  If feelings of sadness and stress last longer than 2 weeks or get in the way of caring for your baby, talk to your health care provider. This may mean you have postpartum depression. This information is not intended to replace advice given to you by your health care provider. Make sure you discuss any questions you have with your health care provider. Document Revised: 05/14/2018 Document Reviewed: 03/18/2016 Elsevier Patient Education  2020 Elsevier Inc.        Perinatal Depression When a woman feels excessive sadness, anger, or anxiety during pregnancy or during the first 12 months after she gives birth, she has a condition called perinatal depression. Depression can interfere with work, school, relationships, and other everyday activities. If it is not managed properly, it can also cause problems in the mother and her baby. Sometimes, perinatal depression is left untreated because symptoms are thought to be normal mood swings during and right after pregnancy. If you have symptoms of depression, it is important to talk with your health care provider. What are the causes? The exact cause of this condition is not known. Hormonal changes during and after pregnancy may play a role in causing perinatal depression. What increases the risk? You are more likely to develop this condition if:  You have a personal or family history of depression, anxiety, or mood disorders.  You experience a stressful life event during pregnancy, such as the death of a loved one.  You have a lot of regular life stress.  You do not have support from family members or loved ones, or you are  in an abusive relationship. What are the signs or symptoms? Symptoms of this condition include:  Feeling sad or hopeless.  Feelings of guilt.  Feeling irritable or overwhelmed.  Changes in your appetite.  Lack of energy or motivation.  Sleep problems.  Difficulty concentrating or completing tasks.  Loss of interest in hobbies or relationships.  Headaches or stomach problems that do not go away. How is this diagnosed? This condition is diagnosed based on a physical exam and mental evaluation. In some cases, your health care provider may use a depression screening tool. These tools include a list of questions that can help a health care provider diagnose depression. Your health care provider may refer you to a mental health expert who specializes in depression. How is this treated? This condition may be treated with:  Medicines. Your health care provider will only give you medicines that have been proven safe for pregnancy and breastfeeding.  Talk therapy with a mental health professional to help change your patterns of thinking (cognitive behavioral  therapy).  Support groups.  Brain stimulation or light therapies.  Stress reduction therapies, such as mindfulness. Follow these instructions at home: Lifestyle  Do not use any products that contain nicotine or tobacco, such as cigarettes and e-cigarettes. If you need help quitting, ask your health care provider.  Do not use alcohol when you are pregnant. After your baby is born, limit alcohol intake to no more than 1 drink a day. One drink equals 12 oz of beer, 5 oz of wine, or 1 oz of hard liquor.  Consider joining a support group for new mothers. Ask your health care provider for recommendations.  Take good care of yourself. Make sure you: ? Get plenty of sleep. If you are having trouble sleeping, talk with your health care provider. ? Eat a healthy diet. This includes plenty of fruits and vegetables, whole grains, and  lean proteins. ? Exercise regularly, as told by your health care provider. Ask your health care provider what exercises are safe for you. General instructions  Take over-the-counter and prescription medicines only as told by your health care provider.  Talk with your partner or family members about your feelings during pregnancy. Share any concerns or anxieties that you may have.  Ask for help with tasks or chores when you need it. Ask friends and family members to provide meals, watch your children, or help with cleaning.  Keep all follow-up visits as told by your health care provider. This is important. Contact a health care provider if:  You (or people close to you) notice that you have any symptoms of depression.  You have depression and your symptoms get worse.  You experience side effects from medicines, such as nausea or sleep problems. Get help right away if:  You feel like hurting yourself, your baby, or someone else. If you ever feel like you may hurt yourself or others, or have thoughts about taking your own life, get help right away. You can go to your nearest emergency department or call:  Your local emergency services (911 in the [place]  A suicide crisis helpline, such as the National Suicide Prevention Lifeline at (973)535-5984. This is open 24 hours a day. Summary  Perinatal depression is when a woman feels excessive sadness, anger, or anxiety during pregnancy or during the first 12 months after she gives birth.  If perinatal depression is not treated, it can lead to health problems for the mother and her baby.  This condition is treated with medicines, talk therapy, stress reduction therapies, or a combination of two or more treatments.  Talk with your partner or family members about your feelings. Do not be afraid to ask for help. This information is not intended to replace advice given to you by your health care provider. Make sure you discuss any questions you  have with your health care provider. Document Revised: 07/07/2018 Document Reviewed: 03/19/2016 Elsevier Patient Education  2020 Elsevier Inc.        Perinatal Anxiety When a woman feels excessive tension or worry (anxiety) during pregnancy or during the first 12 months after she gives birth, she has a condition called perinatal anxiety. Anxiety can interfere with work, school, relationships, and other everyday activities. If it is not managed properly, it can also cause problems in the mother and her baby.  If you are pregnant and you have symptoms of an anxiety disorder, it is important to talk with your health care provider. What are the causes? The exact cause of this condition is  not known. Hormonal changes during and after pregnancy may play a role in causing perinatal anxiety. What increases the risk? You are more likely to develop this condition if:  You have a personal or family history of depression, anxiety, or mood disorders.  You experience a stressful life event during pregnancy, such as the death of a loved one.  You have a lot of regular life stress, such as being a single parent.  You have thyroid problems. What are the signs or symptoms? Perinatal anxiety can be different for everyone. It may include:  Panic attacks (panic disorder). These are intense episodes of fear or discomfort that may also cause sweating, nausea, shortness of breath, or fear of dying. They usually last 5-15 minutes.  Reliving an upsetting (traumatic) event through distressing thoughts, dreams, or flashbacks (post-traumatic stress disorder, or PTSD).  Excessive worry about multiple problems (generalized anxiety disorder).  Fear and stress about leaving certain people or loved ones (separation anxiety).  Performing repetitive tasks (compulsions) to relieve stress or worry (obsessive compulsive disorder, or OCD).  Fear of certain objects or situations (phobias).  Excessive worrying, such  as a constant feeling that something bad is going to happen.  Inability to relax.  Difficulty concentrating.  Sleep problems.  Frequent nightmares or disturbing thoughts. How is this diagnosed? This condition is diagnosed based on a physical exam and mental evaluation. In some cases, your health care provider may use an anxiety screening tool. These tools include a list of questions that can help a health care provider diagnose anxiety. Your health care provider may refer you to a mental health expert who specializes in anxiety. How is this treated? This condition may be treated with:  Medicines. Your health care provider will only give you medicines that have been proven safe for pregnancy and breastfeeding.  Talk therapy with a mental health professional to help change your patterns of thinking (cognitive behavioral therapy).  Mindfulness-based stress reduction.  Other relaxation therapies, such as deep breathing or guided muscle relaxation.  Support groups. Follow these instructions at home: Lifestyle  Do not use any products that contain nicotine or tobacco, such as cigarettes and e-cigarettes. If you need help quitting, ask your health care provider.  Do not use alcohol when you are pregnant. After your baby is born, limit alcohol intake to no more than 1 drink a day. One drink equals 12 oz of beer, 5 oz of wine, or 1 oz of hard liquor.  Consider joining a support group for new mothers. Ask your health care provider for recommendations.  Take good care of yourself. Make sure you: ? Get plenty of sleep. If you are having trouble sleeping, talk with your health care provider. ? Eat a healthy diet. This includes plenty of fruits and vegetables, whole grains, and lean proteins. ? Exercise regularly, as told by your health care provider. Ask your health care provider what exercises are safe for you. General instructions  Take over-the-counter and prescription medicines only as  told by your health care provider.  Talk with your partner or family members about your feelings during pregnancy. Share any concerns or fears that you may have.  Ask for help with tasks or chores when you need it. Ask friends and family members to provide meals, watch your children, or help with cleaning.  Keep all follow-up visits as told by your health care provider. This is important. Contact a health care provider if:  You (or people close to you) notice that you  have any symptoms of anxiety or depression.  You have anxiety and your symptoms get worse.  You experience side effects from medicines, such as nausea or sleep problems. Get help right away if:  You feel like hurting yourself, your baby, or someone else. If you ever feel like you may hurt yourself or others, or have thoughts about taking your own life, get help right away. You can go to your nearest emergency department or call:  Your local emergency services (911 in the [place]  A suicide crisis helpline, such as the National Suicide Prevention Lifeline at 3344071788. This is open 24 hours a day. Summary  Perinatal anxiety is when a woman feels excessive tension or worry during pregnancy or during the first 12 months after she gives birth.  Perinatal anxiety may include panic attacks, post-traumatic stress disorder, separation anxiety, phobias, or generalized anxiety.  Perinatal anxiety can cause physical health problems in the mother and baby if not properly managed.  This condition is treated with medicines, talk therapy, stress reduction therapies, or a combination of two or more treatments.  Talk with your partner or family members about your concerns or fears. Do not be afraid to ask for help. This information is not intended to replace advice given to you by your health care provider. Make sure you discuss any questions you have with your health care provider. Document Revised: 01/23/2017 Document Reviewed:  03/19/2016 Elsevier Patient Education  2020 Elsevier Inc.      AREA FAMILY PRACTICE PHYSICIANS  [place] ([id number]) . [facility] o [address] [place] [place], [place] [address] o [phone] o Mon-Fri 8:30-12:30, 1:30-5:00 o Accepting Medicaid . [facility] at [facility] [address], [place], [place] [address] o [phone] o Mon-Fri 8:00-5:30 . [employer] o [address], [place], [place] [address] o [phone], Tue, Thur, Fri 8:30-5:00, Wed 10:00-7:00 (closed 1-2pm) o Accepting Medicaid . [facility] o [address], [place], [place]  [address] o Phone - [phone]   Fax - [phone]  [place] [phone]) . [facility] o [address], [place], [place] [address] o [phone] o Mon-Fri 8:00-5:00 . [facility] at [facility])  o [address] [place] [place], [place] [address] o [phone] o Mon-Fri 8:30-5:30, Sat (Oct.-Mar.) 9:00-1:00 o Accepting [facility] [phone]) .  [facility] o [address], [place], [place] [address] o [phone] o Mon-Fri 8:00-5:00  [place] [phone]) . [facility] at [facility] o [address], [place], [place] [address] o [phone] o Mon-Fri 8:00-5:00 . Nature conservation officer at [place] o [address] [place], [place], [place] [address] o [phone] o Mon-Fri 8:00-5:00 . Nature conservation officer at [employer] o [address] [place], [place] [address] o [phone] o Mon-Fri 8:00-5:00 . [facility] o [address] [place] [address] o [phone] o Mon-Fri 7:30-5:30  [place] [phone] & [phone]) . [facility] o [address], [place], [place] [address] o [phone] o Mon-Thur  8:00-6:00 o Accepting Medicaid . [facility] Medicine o [address] [place], [place] [address] o [phone] o Mon-Thur 7:30-7:30, Fri 7:30-4:30 o Accepting Medicaid . [facility] [facility] at [facility] o [phone] [address], [place], [place]  [address] o [phone]   Fax - [phone]  [place] [phone] & [phone]) . Nature conservation officer at [employer]  [place] o [address] [place], [place] [address] o [phone] o Mon-Fri 7:00-5:00 . [facility] o [address], [place], [place] [address] o [phone] o Mon-Fri 8:00-5:00 o Accepting Medicaid . [facility] - [facility] o [address] [place], [place] [address] o [phone] o Mon-Fri 8:00-5:00 o Accepting Medicaid  [facility] [place] [phone]) . [facility] at [facility] o [address] [place], [place] [address] o [phone] o Mon-Fri 8:00-5:00 . [facility] - [facility] at [employer]) o [address], [place], [place] [address] o [phone] o Mon-Fri 8:00-5:00 o Accepting Medicaid . [facility] - [facility] at [employer]) o [address], [place], [place] [address] o [phone] o Mon-Fri 8:00-5:30, Sat&Sun by appointment (phones open at 8:30) o Accepting [facility] [phone] & [phone]) . [facility] Medicine o [address], [place], [place] [address] o [phone] o Mon-Thur 8:00-7:00, Fri 8:00-5:00, Sat 8:00-12:00, Sun 9:00-12:00 o Accepting Medicaid . [facility] at [facility] [address], [place], [place] [address] o [phone] o Mon-Thur 8:00-5:00 o Accepting Medicaid . [facility] at [place] o [address] [place] [place], [place]  [address] o [phone] o Mon-Fri 8:00-5:30, Sat (Oct.-Mar.) 9:00-1:00 o Accepting [facility] [phone]) . [facility] o [address] [place], [place] [address] o [phone] o Mon-Fri 8:00-5:00 o Accepting Medicaid   [facility] [phone]) . [facility] at [facility] o [address], [place], [place] [address] o [phone] o Mon-Fri 8:00-5:00 . Nature conservation officer at [facility] o [address], [place], [place] [address] o [phone] o Mon-Fri 8:00-5:00 . [facility] - [place] o [address], [place], [place] [address] o [phone] o Mon-Fri 8:00-5:00 o After hours clinic [facility] [place], [place] [address]) [phone] Mon-Fri 5:00-8:00, Sat 12:00-6:00, Sun 10:00-4:00 o Accepting Medicaid . [facility] at [facility] o [address] [place], [place]  [address] o [phone]   Fax - [phone]  [place] [phone]) . Nature conservation officer at [facility] o [address] [place], [place] [address] o [phone] o Mon-Fri 8:00-5:00 . [facility] - [place] [facility] at [place]) o [address], [place], [place] [address] o [phone] o Mon-Thur 8:00-7:00, Fri 8:00-5:00, Sat 8:00-12:00            Steps to Quit Smoking Smoking tobacco is the leading cause of preventable death. It can affect almost every organ in the body. Smoking puts you and those around you at risk for developing many serious chronic diseases. Quitting smoking can be difficult, but it is one of the best things that you can do for your health. It is never too late to quit. How do I get ready to quit? When you decide to quit smoking, create a plan to help you succeed. Before you quit:  Pick a date to quit. Set a date within the next 2 weeks to give you time to prepare.  Write down the reasons why you are quitting. Keep this list in places where  you will see it often.  Tell your family, friends, and co-workers that you are quitting. Support from your loved ones  can make quitting easier.  Talk with your health care provider about your options for quitting smoking.  Find out what treatment options are covered by your health insurance.  Identify people, places, things, and activities that make you want to smoke (triggers). Avoid them. What first steps can I take to quit smoking?  Throw away all cigarettes at home, at work, and in your car.  Throw away smoking accessories, such as Set [employer].  Clean your car. Make sure to empty the ashtray.  Clean your home, including curtains and carpets. What strategies can I use to quit smoking? Talk with your health care provider about combining strategies, such as taking medicines while you are also receiving in-person counseling. Using these two strategies together makes you more likely to succeed in quitting than if you used either strategy on its own.  If you are pregnant or breastfeeding, talk with your health care provider about finding counseling or other support strategies to quit smoking. Do not take medicine to help you quit smoking unless your health care provider tells you to do so. To quit smoking: Quit right away  Quit smoking completely, instead of gradually reducing how much you smoke over a period of time. Research shows that stopping smoking right away is more successful than gradually quitting.  Attend in-person counseling to help you build problem-solving skills. You are more likely to succeed in quitting if you attend counseling sessions regularly. Even short sessions of 10 minutes can be effective. Take medicine You may take medicines to help you quit smoking. Some medicines require a prescription and some you can purchase over-the-counter. Medicines may have nicotine in them to replace the nicotine in cigarettes. Medicines may:  Help to stop cravings.  Help  to relieve withdrawal symptoms. Your health care provider may recommend:  Nicotine patches, gum, or lozenges.  Nicotine inhalers or sprays.  Non-nicotine medicine that is taken by mouth. Find resources Find resources and support systems that can help you to quit smoking and remain smoke-free after you quit. These resources are most helpful when you use them often. They include:  Online chats with a Veterinary surgeon.  Telephone quitlines.  Printed Materials engineer.  Support groups or group counseling.  Text messaging programs.  Mobile phone apps or applications. Use apps that can help you stick to your quit plan by providing reminders, tips, and encouragement. There are many free apps for mobile devices as well as websites. Examples include Quit Guide from the [employer] and smokefree.gov What things can I do to make it easier to quit?   Reach out to your family and friends for support and encouragement. Call telephone quitlines (1-800-QUIT-NOW), reach out to support groups, or work with a counselor for support.  Ask people who smoke to avoid smoking around you.  Avoid places that trigger you to smoke, such as bars, parties, or smoke-break areas at work.  Spend time with people who do not smoke.  Lessen the stress in your life. Stress can be a smoking trigger for some people. To lessen stress, try: ? Exercising regularly. ? Doing deep-breathing exercises. ? Doing yoga. ? Meditating. ? Performing a body scan. This involves closing your eyes, scanning your body from head to toe, and noticing which parts of your body are particularly tense. Try to relax the muscles in those areas. How will I feel when I quit smoking? Day 1 to 3 weeks Within the first 24 hours of quitting smoking, you may start to feel withdrawal symptoms. These  symptoms are usually most noticeable 2-3 days after quitting, but they usually do not last for more than 2-3 weeks. You may experience these symptoms:  Mood  swings.  Restlessness, anxiety, or irritability.  Trouble concentrating.  Dizziness.  Strong cravings for sugary foods and nicotine.  Mild weight gain.  Constipation.  Nausea.  Coughing or a sore throat.  Changes in how the medicines that you take for unrelated issues work in your body.  Depression.  Trouble sleeping (insomnia). Week 3 and afterward After the first 2-3 weeks of quitting, you may start to notice more positive results, such as:  Improved sense of smell and taste.  Decreased coughing and sore throat.  Slower heart rate.  Lower blood pressure.  Clearer skin.  The ability to breathe more easily.  Fewer sick days. Quitting smoking can be very challenging. Do not get discouraged if you are not successful the first time. Some people need to make many attempts to quit before they achieve long-term success. Do your best to stick to your quit plan, and talk with your health care provider if you have any questions or concerns. Summary  Smoking tobacco is the leading cause of preventable death. Quitting smoking is one of the best things that you can do for your health.  When you decide to quit smoking, create a plan to help you succeed.  Quit smoking right away, not slowly over a period of time.  When you start quitting, seek help from your health care provider, family, or friends. This information is not intended to replace advice given to you by your health care provider. Make sure you discuss any questions you have with your health care provider. Document Revised: 10/15/2018 Document Reviewed: 04/10/2018 Elsevier Patient Education  2020 [employer].

## 2019-11-19 IMAGING — US US MFM OB DETAIL+14 WK
1 series · 14 of 28 positions shown · non-contrast
Comparison: none

[Series 1: us mfm ob detail+14 wk · 84 acquisitions, 14 frames shown]
[im 4/84]
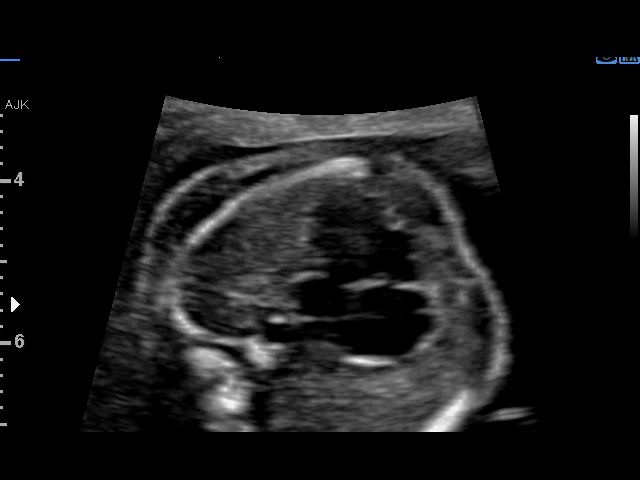
[im 10/84]
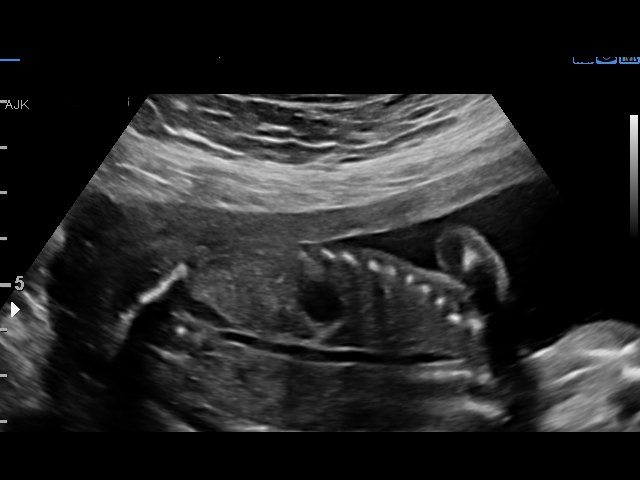
[im 16/84]
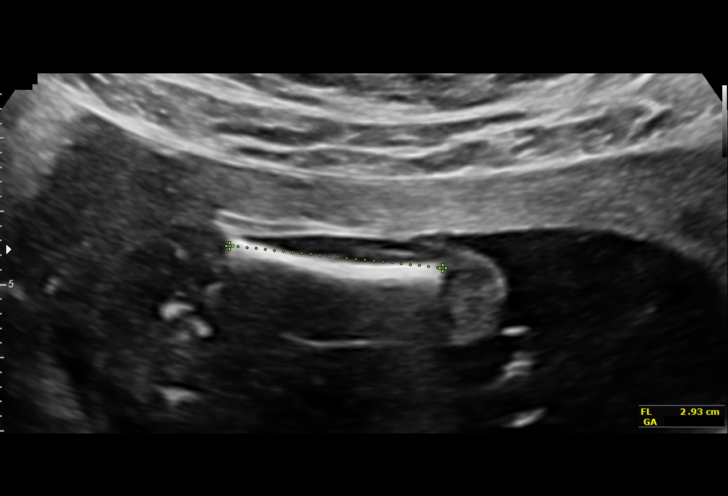
[im 22/84]
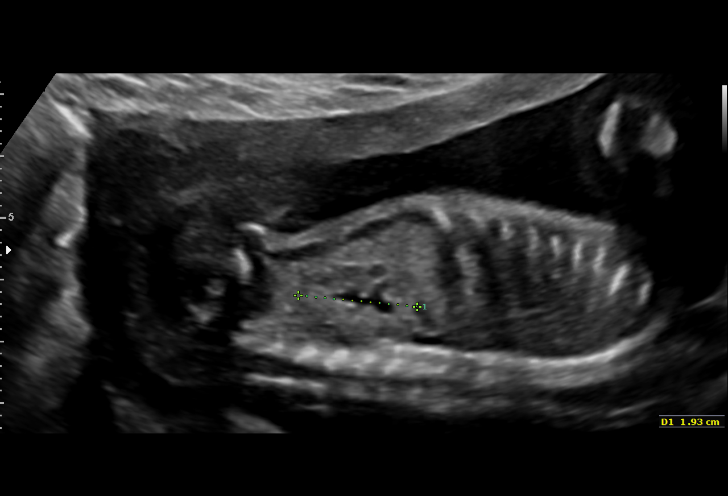
[im 28/84]
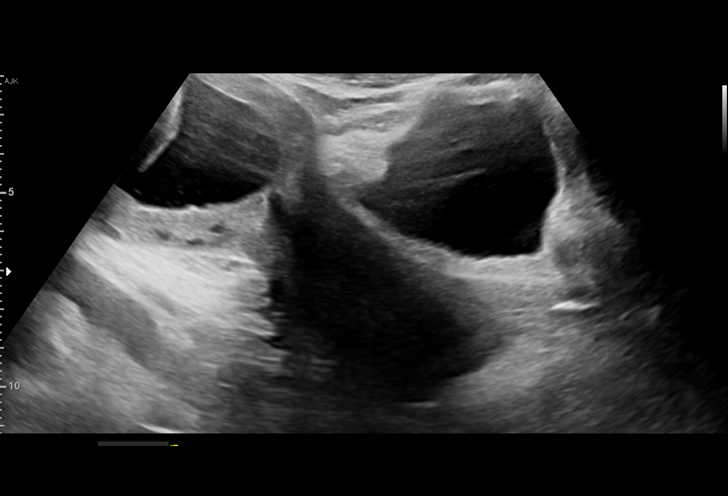
[im 34/84]
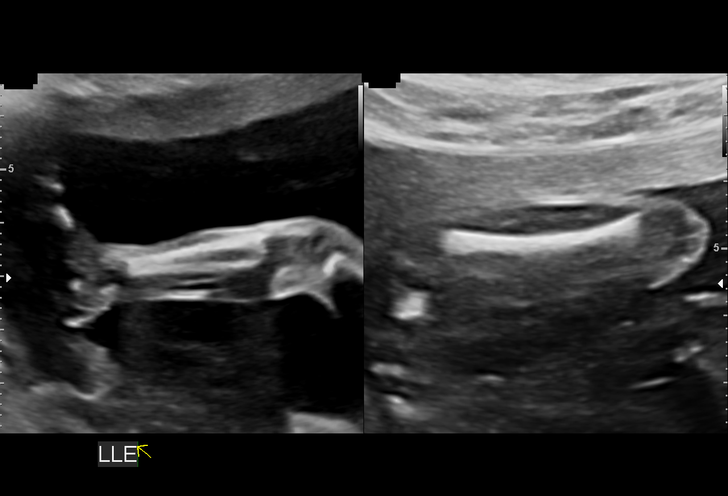
[im 40/84]
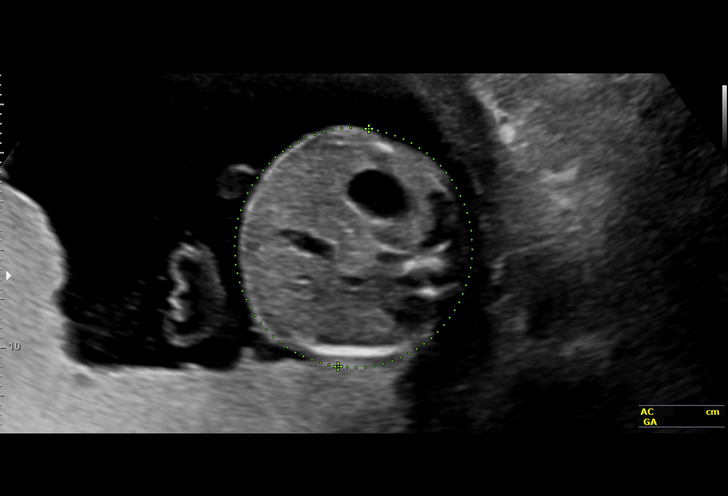
[im 47/84]
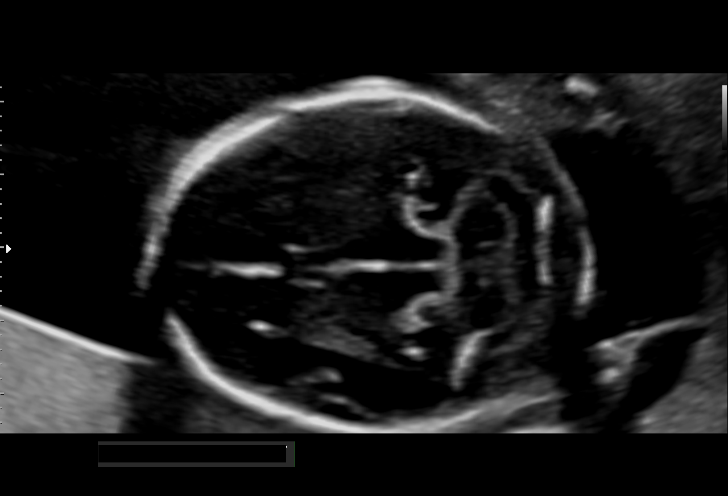
[im 53/84]
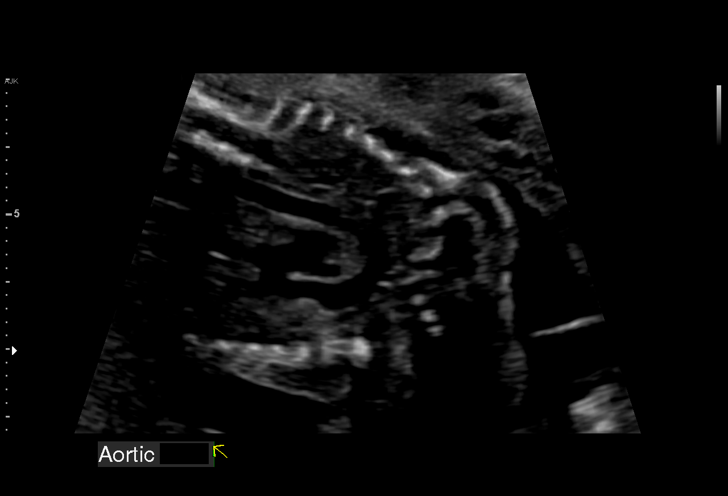
[im 59/84]
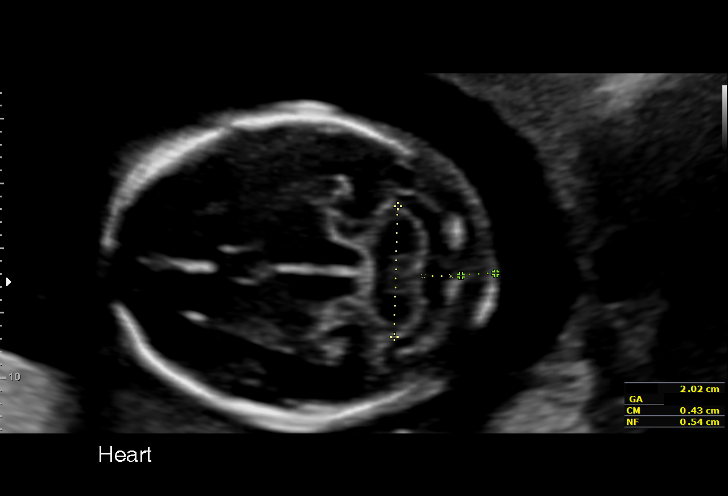
[im 65/84]
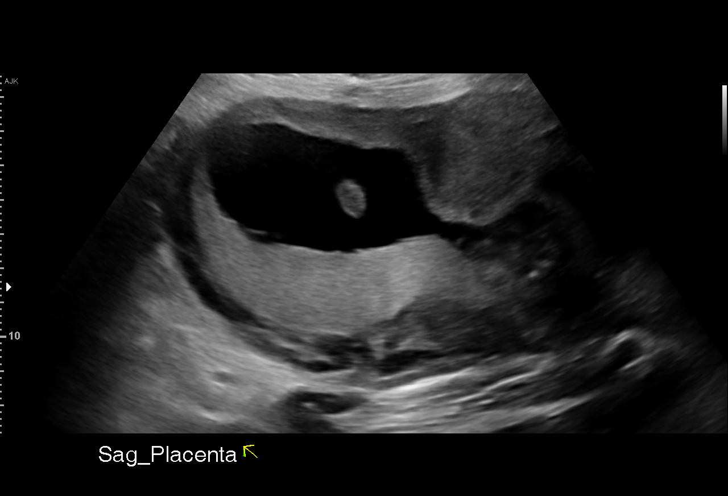
[im 71/84]
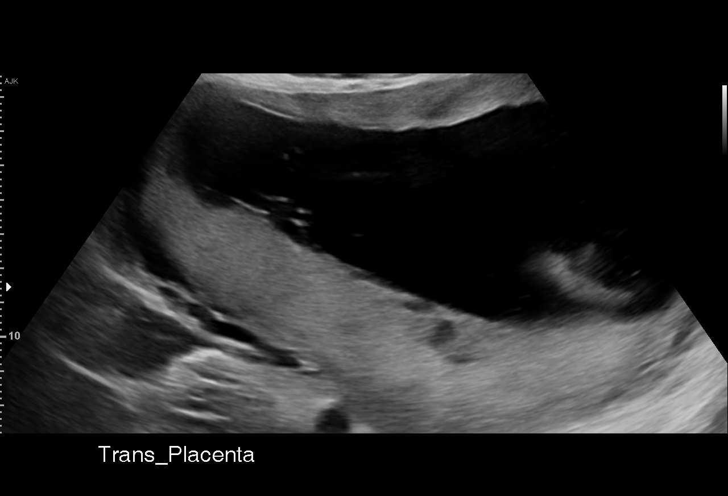
[im 77/84]
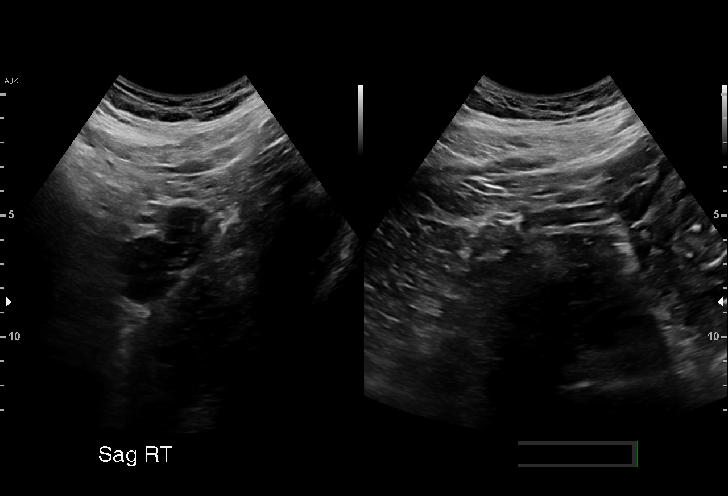
[im 84/84]
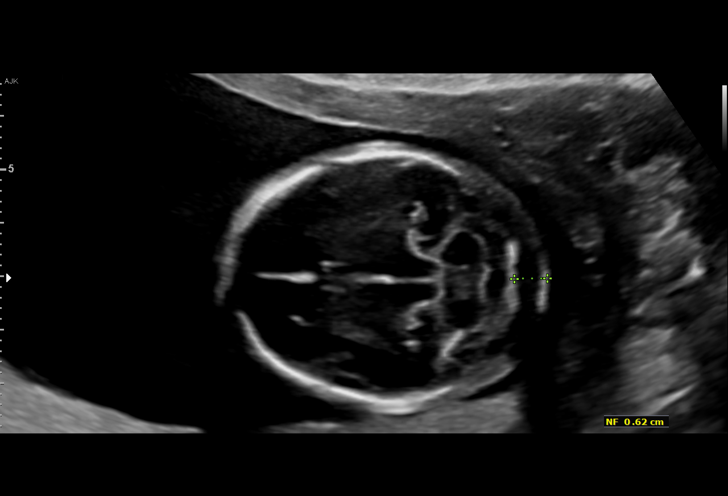

[14 of 28 positions shown; findings below may reference images not displayed]

CNM

 ----------------------------------------------------------------------

 ----------------------------------------------------------------------
Indications

  Maternal morbid obesity
  Encounter for antenatal screening for
  malformations (Neg AFP, low risk NIPS)
  18 weeks gestation of pregnancy
 ----------------------------------------------------------------------
Vital Signs

 BMI:
Fetal Evaluation

 Num Of Fetuses:          1
 Fetal Heart Rate(bpm):   155
 Cardiac Activity:        Observed
 Presentation:            Cephalic
 Placenta:                Posterior
 P. Cord Insertion:       Visualized, central

 Amniotic Fluid
 AFI FV:      Within normal limits

                             Largest Pocket(cm)

Biometry

 BPD:      45.7  mm     G. Age:  19w 6d         86  %    CI:          73.4  %    70 - 86
                                                         FL/HC:       17.1  %    16.1 -
 HC:      169.5  mm     G. Age:  19w 4d         76  %    HC/AC:       1.10       1.09 -
 AC:      153.9  mm     G. Age:  20w 4d         91  %    FL/BPD:      63.5  %
 FL:         29  mm     G. Age:  18w 6d         46  %    FL/AC:       18.8  %    20 - 24
 CER:      19.7  mm     G. Age:  18w 6d         50  %
 NFT:       6.1  mm
 LV:        5.7  mm
 CM:        4.3  mm

 Est. FW:     313   gm   0 lb 11 oz      59  %
OB History

 Gravidity:    4         Term:   2        Prem:   0        SAB:   1
 TOP:          0       Ectopic:  0        Living: 2
Gestational Age

 LMP:           18w 6d        Date:  09/29/17                 EDD:   07/06/18
 U/S Today:     19w 5d                                        EDD:   06/30/18
 Best:          18w 6d     Det. By:  LMP  (09/29/17)          EDD:   07/06/18
Anatomy

 Cranium:               Appears normal         Aortic Arch:            Appears normal
 Cavum:                 Appears normal         Ductal Arch:            Appears normal
 Ventricles:            Appears normal         Diaphragm:              Appears normal
 Choroid Plexus:        Appears normal         Stomach:                Appears normal, left
                                                                       sided
 Cerebellum:            Appears normal         Abdomen:                Appears normal
 Posterior Fossa:       Appears normal         Abdominal Wall:         Appears nml (cord
                                                                       insert, abd wall)
 Nuchal Fold:           ? Appears              Cord Vessels:           Appears normal (3
                        enlarged,6.2 mm
                                                                       vessel cord)
 Face:                  Appears normal         Kidneys:                Appear normal
                        (orbits and profile)
 Lips:                  Appears normal         Bladder:                Appears normal
 Thoracic:              Appears normal         Spine:                  Not well visualized
 Heart:                 Not well visualized    Upper Extremities:      Appears normal
 RVOT:                  Appears normal         Lower Extremities:      Appears normal
 LVOT:                  Not well visualized

 Other:  Fetus appears to be a male. Heels, feet, and LT 5th digit visualized.
         Nasal bone visualized.
Cervix Uterus Adnexa

 Cervix
 Length:           6.42  cm.
 Normal appearance by transabdominal scan.

 Uterus
 No abnormality visualized.

 Left Ovary
 Within normal limits.

 Right Ovary
 Not visualized.

 Adnexa
 No abnormality visualized.
Impression

 Mildly thickened nuchal fold of 6.2 mm.
 Low risk NIPS and AFP
 Suboptimal views of the fetal anatomy.
 Normal interval growth.
Recommendations

 Follow up anatomy in 4 weeks.

## 2019-11-22 ENCOUNTER — Encounter: Payer: BC Managed Care – PPO | Admitting: Licensed Clinical Social Worker

## 2019-11-28 ENCOUNTER — Other Ambulatory Visit: Payer: Self-pay | Admitting: Nurse Practitioner

## 2019-11-28 DIAGNOSIS — F0781 Postconcussional syndrome: Secondary | ICD-10-CM

## 2019-12-12 ENCOUNTER — Other Ambulatory Visit: Payer: BC Managed Care – PPO

## 2019-12-13 ENCOUNTER — Encounter: Payer: Self-pay | Admitting: Obstetrics

## 2020-02-10 ENCOUNTER — Other Ambulatory Visit: Payer: Self-pay | Admitting: Nurse Practitioner

## 2020-02-10 DIAGNOSIS — G43009 Migraine without aura, not intractable, without status migrainosus: Secondary | ICD-10-CM

## 2020-03-05 ENCOUNTER — Ambulatory Visit
Admission: RE | Admit: 2020-03-05 | Discharge: 2020-03-05 | Disposition: A | Discharge status: Home / Self Care | Payer: BC Managed Care – PPO | Source: Ambulatory Visit | Attending: Nurse Practitioner | Admitting: Nurse Practitioner

## 2020-03-05 DIAGNOSIS — G43009 Migraine without aura, not intractable, without status migrainosus: Secondary | ICD-10-CM

## 2020-05-10 ENCOUNTER — Other Ambulatory Visit: Payer: Self-pay

## 2020-05-10 MED ORDER — METRONIDAZOLE 500 MG PO TABS: 500.0000 mg | ORAL_TABLET | Freq: Two times a day (BID) | ORAL | 0 refills | Status: DC

## 2020-05-10 NOTE — Telephone Encounter (Signed)
Patient request flagyl for BV due to odor and smell. Advised patient will send in one refill.  Appointment in the office if symptoms do not clear up.

## 2020-05-15 NOTE — Progress Notes (Signed)
Monitor was never returned and marked "lost" in [employer] system. Order will be cancelled.  

## 2020-07-04 ENCOUNTER — Ambulatory Visit (HOSPITAL_COMMUNITY)
Admission: EM | Admit: 2020-07-04 | Discharge: 2020-07-04 | Disposition: A | Discharge status: Home / Self Care | Payer: Managed Care, Other (non HMO) | Attending: Family Medicine | Admitting: Family Medicine

## 2020-07-04 ENCOUNTER — Other Ambulatory Visit: Payer: Self-pay

## 2020-07-04 DIAGNOSIS — H1032 Unspecified acute conjunctivitis, left eye: Secondary | ICD-10-CM | POA: Diagnosis not present

## 2020-07-04 DIAGNOSIS — I10 Essential (primary) hypertension: Secondary | ICD-10-CM | POA: Diagnosis not present

## 2020-07-04 MED ORDER — TOBRAMYCIN 0.3 % OP SOLN: 1.0000 [drp] | Freq: Four times a day (QID) | OPHTHALMIC | 0 refills | Status: AC

## 2020-07-04 NOTE — ED Triage Notes (Signed)
PT reports left eye itching, redness, and drainage. Started last night. Her toddler started daycare last week and is rubbing at his eyes

## 2020-07-04 NOTE — Discharge Instructions (Signed)
Your blood pressure was noted to be elevated during your visit today. If you are currently taking medication for high blood pressure, please ensure you are taking this as directed. If you do not have a history of high blood pressure and your blood pressure remains persistently elevated, you may need to begin taking a medication at some point. You may return here within the next few days to recheck if unable to see your primary care provider or if you do not have a one.  BP (!) 149/103   Pulse 86   Temp 98.3 F (36.8 C) (Oral)   Resp 16   LMP 06/26/2020   SpO2 99%   BP Readings from Last 3 Encounters:  07/04/20 (!) 149/103  11/15/19 (!) 131/98  10/11/19 122/82

## 2020-07-07 NOTE — ED Provider Notes (Addendum)
[facility]   [id number] 07/04/20 Arrival Time: 1742  ASSESSMENT & PLAN:  1. Acute bacterial conjunctivitis of left eye   2. Elevated blood pressure reading in office with diagnosis of hypertension     Meds ordered this encounter  Medications  . tobramycin (TOBREX) 0.3 % ophthalmic solution    Sig: Place 1 drop into the right eye every 6 (six) hours.    Dispense:  5 mL    Refill:  0    Ophthalmic drops per orders. Warm compress to eye(s). Local eye care discussed.    Discharge Instructions      Your blood pressure was noted to be elevated during your visit today. If you are currently taking medication for high blood pressure, please ensure you are taking this as directed. If you do not have a history of high blood pressure and your blood pressure remains persistently elevated, you may need to begin taking a medication at some point. You may return here within the next few days to recheck if unable to see your primary care provider or if you do not have a one.  BP (!) 149/103   Pulse 86   Temp 98.3 F (36.8 C) (Oral)   Resp 16   LMP 06/26/2020   SpO2 99%   BP Readings from Last 3 Encounters:  07/04/20 (!) 149/103  11/15/19 (!) 131/98  10/11/19 122/82          Follow-up Information    Rosario Adie, Juel Burrow, FNP.   Specialties: Nurse Practitioner, Psychiatry Why: As needed. Contact information: [address] [place] [address] [phone]        [facility] [place].   Specialty: Urgent Care Why: If worsening or failing to improve as anticipated. Contact information: [address] [place] [address] [phone]             Reviewed expectations re: course of current medical issues. Questions answered. Outlined signs and symptoms indicating need for more acute intervention. Patient verbalized understanding. After Visit Summary given.   SUBJECTIVE:  Victoria Robinson is a 35  y.o. female who presents with complaint of itching/irritation of her left eye; watery drainage. Onset abrupt, yesterday. Without photophobia. Injury: no. Visual changes: no. Contact lens use: no. Recent illness: no. Self treatment: none PTA. Child with similar; improving.  Increased blood pressure noted today. Reports that she is treated for HTN. She reports no chest pain on exertion, no dyspnea on exertion, no swelling of ankles, no orthostatic dizziness or lightheadedness, no orthopnea or paroxysmal nocturnal dyspnea and no palpitations.  OBJECTIVE:  Vitals:   07/04/20 1759  BP: (!) 149/103  Pulse: 86  Resp: 16  Temp: 98.3 F (36.8 C)  TempSrc: Oral  SpO2: 99%    General appearance: alert; no distress HEENT: [place]; AT; PERRLA; no restriction of the extraocular movements OS: without reported pain; with 1+ conjunctival injection; with watery/opaque drainage; without corneal opacities; without limbal flush; without periorbital swelling or erythema OD: normal Neck: supple without LAD Lungs: unlabored respirations Skin: warm and dry Psychological: alert and cooperative; normal mood and affect   No Known Allergies  Past Medical History:  Diagnosis Date  . Abnormal Pap smear    repeat WNL  . Asthma childhood  . History of pre-eclampsia   . Hypertension   . Shortness of breath   . Supervision of other normal pregnancy, antepartum 03/29/2019    Nursing Staff Provider Office Location  [facility] Dating  LMP Language  English Anatomy US  normal Flu Vaccine  Declined 03/29/19 Genetic Screen  NIPS:low risks   AFP: neg  TDaP vaccine  Given 07/26/19 Hgb A1C or  GTT Early A1c 5.2 Third trimester nl 2 hour Rhogam     LAB RESULTS  Feeding Plan Breast/Bottle Blood Type O/Positive/-- (02/23 1639)  Contraception Tubal Antibody Negative (02/23 1639) Circu   Social History   Socioeconomic History  . Marital status: Single    Spouse name: Not on file  . Number of children: Not on file  . Years of  education: Not on file  . Highest education level: Not on file  Occupational History  . Not on file  Tobacco Use  . Smoking status: Current Every Day Smoker    Packs/day: 0.50    Types: Cigarettes  . Smokeless tobacco: Never Used  Vaping Use  . Vaping Use: Never used  Substance and Sexual Activity  . Alcohol use: Not Currently    Comment: occasionally   . Drug use: No  . Sexual activity: Yes    Partners: Male    Birth control/protection: Surgical  Other Topics Concern  . Not on file  Social History Narrative  . Not on file   Social Determinants of Health   Financial Resource Strain: Not on file  Food Insecurity: Not on file  Transportation Needs: Not on file  Physical Activity: Not on file  Stress: Not on file  Social Connections: Not on file  Intimate Partner Violence: Not on file   Family History  Problem Relation Age of Onset  . Hyperlipidemia Mother   . Hyperlipidemia Father   . Hypertension Father   . Diabetes Maternal Grandmother   . Diabetes Paternal Grandmother    Past Surgical History:  Procedure Laterality Date  . APPENDECTOMY    . CESAREAN SECTION N/A 10/03/2019   Procedure: CESAREAN SECTION;  Surgeon: Hermina Staggers, MD;  Location: [facility];  Service: Obstetrics;  Laterality: N/A;  . Salpingectomy  10/03/2019     Mardella Layman, MD 07/07/20 1340    Mardella Layman, MD 07/07/20 1341

## 2020-10-07 ENCOUNTER — Ambulatory Visit (HOSPITAL_COMMUNITY)
Admission: EM | Admit: 2020-10-07 | Discharge: 2020-10-07 | Disposition: A | Discharge status: Home / Self Care | Payer: Managed Care, Other (non HMO) | Attending: Emergency Medicine | Admitting: Emergency Medicine

## 2020-10-07 ENCOUNTER — Encounter (HOSPITAL_COMMUNITY): Payer: Self-pay

## 2020-10-07 DIAGNOSIS — H66001 Acute suppurative otitis media without spontaneous rupture of ear drum, right ear: Secondary | ICD-10-CM | POA: Diagnosis not present

## 2020-10-07 DIAGNOSIS — R591 Generalized enlarged lymph nodes: Secondary | ICD-10-CM | POA: Diagnosis not present

## 2020-10-07 MED ORDER — AMOXICILLIN 500 MG PO CAPS: 500.0000 mg | ORAL_CAPSULE | Freq: Two times a day (BID) | ORAL | 0 refills | Status: AC

## 2020-10-07 NOTE — ED Triage Notes (Signed)
Pt presents with knot on back of right side of head that is radiating pain on right side of neck and down right arm X 3 days.

## 2020-10-07 NOTE — Discharge Instructions (Addendum)
Take the amoxicillin twice a day for the next 5 days.   You can take Tylenol and/or Ibuprofen as need for pain relief and fever reduction.   You can apply heat, ice, or alternate between heat and ice for comfort.   Keep an eye on the lymph node and follow up with your primary care provider for re-evaluation.

## 2020-10-07 NOTE — ED Provider Notes (Signed)
[facility]    CSN: [id number] Arrival date & time: 10/07/20  1113      History   Chief Complaint Chief Complaint  Patient presents with   Knot on Back of Head   Neck Pain   Arm Pain    HPI Victoria Robinson is a 35 y.o. female.   Patient here for evaluation of knot to the back of the right head with pain that radiates down neck.  Reports she first noticed swelling approximately 3 days ago.  Denies any congestion, ear pain, or sore throat.  Denies any trauma, injury, or other precipitating event.  Denies any specific alleviating or aggravating factors.  Denies any fevers, chest pain, shortness of breath, N/V/D, numbness, tingling, weakness, abdominal pain, or headaches.     The history is provided by the patient.   Past Medical History:  Diagnosis Date   Abnormal Pap smear    repeat WNL   Asthma childhood   History of pre-eclampsia    Hypertension    Shortness of breath    Supervision of other normal pregnancy, antepartum 03/29/2019    Nursing Staff Provider Office Location  [facility] Dating  LMP Language  English Anatomy US  normal Flu Vaccine  Declined 03/29/19 Genetic Screen  NIPS:low risks   AFP: neg  TDaP vaccine  Given 07/26/19 Hgb A1C or  GTT Early A1c 5.2 Third trimester nl 2 hour Rhogam     LAB RESULTS  Feeding Plan Breast/Bottle Blood Type O/Positive/-- (02/23 1639)  Contraception Tubal Antibody Negative (02/23 1639) Circu    Patient Active Problem List   Diagnosis Date Noted   History of herpes genitalis 10/03/2019   Chronic hypertension during pregnancy, antepartum 03/29/2019   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 Authorizing Provider  amoxicillin (AMOXIL) 500 MG capsule Take 1 capsule (500 mg total) by mouth 2 (two) times daily for 5 days. 10/07/20 10/12/20 Yes Ivette Loyal, NP  acetaminophen (TYLENOL) 325 MG tablet Take 2 tablets (650 mg total) by mouth every 6 (six) hours. 10/05/19   Sheila Oats, MD  coconut oil OIL Apply 1 application topically as needed (nipple pain). 10/05/19   Sheila Oats, MD  ferrous gluconate (FERGON) 324 MG tablet Take 1 tablet (324 mg total) by mouth every other day. 10/06/19   Sheila Oats, MD  ibuprofen (ADVIL) 800 MG tablet Take 1 tablet (800 mg total) by mouth every 8 (eight) hours. Patient not taking: Reported on 11/15/2019 10/06/19   Sheila Oats, MD  metroNIDAZOLE (FLAGYL) 500 MG tablet Take 1 tablet (500 mg total) by mouth 2 (two) times daily. 05/10/20  Brock Bad, MD  NIFEdipine (ADALAT CC) 30 MG 24 hr tablet Take 1 tablet (30 mg total) by mouth daily. 10/05/19   Sheila Oats, MD  oxyCODONE (OXY IR/ROXICODONE) 5 MG immediate release tablet Take 1 tablet (5 mg total) by mouth every 6 (six) hours as needed for severe pain or breakthrough pain. Patient not taking: Reported on 11/15/2019 10/05/19   Sheila Oats, MD  Prenatal Vit w/Fe-Methylfol-FA (PNV PO) Take 1 tablet by mouth daily.     [provider]  tobramycin (TOBREX) 0.3 % ophthalmic solution Place 1 drop into the right eye every 6 (six) hours. 07/04/20   Mardella Layman, MD  valACYclovir (VALTREX) 500 MG tablet Take 1 tablet (500 mg total) by mouth 2 (two) times daily. 11/15/19   Nugent, Odie Sera, NP    Family History Family History  Problem Relation Age of Onset   Hyperlipidemia Mother    Hyperlipidemia Father    Hypertension Father    Diabetes Maternal Grandmother    Diabetes  Paternal Grandmother     Social History Social History   Tobacco Use   Smoking status: Every Day    Packs/day: 0.50    Types: Cigarettes   Smokeless tobacco: Never  Vaping Use   Vaping Use: Never used  Substance Use Topics   Alcohol use: Not Currently    Comment: occasionally    Drug use: No     Allergies   Patient has no known allergies.   Review of Systems Review of Systems  Hematological:  Positive for adenopathy.  All other systems reviewed and are negative.   Physical Exam Triage Vital Signs ED Triage Vitals  Enc Vitals Group     BP 10/07/20 1216 (!) 148/97     Pulse Rate 10/07/20 1216 79     Resp 10/07/20 1216 18     Temp 10/07/20 1216 98 F (36.7 C)     Temp Source 10/07/20 1216 Oral     SpO2 10/07/20 1216 96 %     Weight --      Height --      Head Circumference --      Peak Flow --      Pain Score 10/07/20 1220 8     Pain Loc --      Pain Edu? --      Excl. in GC? --    No data found.  Updated Vital Signs BP (!) 148/97 (BP Location: Right Arm)   Pulse 79   Temp 98 F (36.7 C) (Oral)   Resp 18   LMP 09/20/2020   SpO2 96%   Visual Acuity Right Eye Distance:   Left Eye Distance:   Bilateral Distance:    Right Eye Near:   Left Eye Near:    Bilateral Near:     Physical Exam Vitals and nursing note reviewed.  Constitutional:      General: She is not in acute distress.    Appearance: Normal appearance. She is not ill-appearing, toxic-appearing or diaphoretic.  HENT:     Head: Normocephalic and atraumatic.     Right Ear: Tympanic membrane is injected and erythematous.     Left Ear: Tympanic membrane, ear canal and external ear normal.     Mouth/Throat:     Pharynx: Posterior oropharyngeal erythema present. No pharyngeal swelling.     Tonsils: No tonsillar exudate or tonsillar abscesses.  Eyes:     Conjunctiva/sclera: Conjunctivae normal.  Cardiovascular:     Rate and  Rhythm: Normal rate.     Pulses: Normal pulses.  Pulmonary:      Effort: Pulmonary effort is normal.  Abdominal:     General: Abdomen is flat.  Musculoskeletal:        General: Normal range of motion.     Cervical back: Normal range of motion.  Lymphadenopathy:     Head:     Right side of head: Occipital (nomobile, mildly tender) adenopathy present.  Skin:    General: Skin is warm and dry.  Neurological:     General: No focal deficit present.     Mental Status: She is alert and oriented to person, place, and time.  Psychiatric:        Mood and Affect: Mood normal.     UC Treatments / Results  Labs (all labs ordered are listed, but only abnormal results are displayed) Labs Reviewed - No data to display  EKG   Radiology No results found.  Procedures Procedures (including critical care time)  Medications Ordered in UC Medications - No data to display  Initial Impression / Assessment and Plan / UC Course  I have reviewed the triage vital signs and the nursing notes.  Pertinent labs & imaging results that were available during my care of the patient were reviewed by me and considered in my medical decision making (see chart for details).    Assessment negative for red flags or concern.  Right occipital lymphadenopathy and otitis media of the right ear.  We will treat with amoxicillin twice daily for the next 5 days.  Tylenol and/or ibuprofen as needed.  May use heat, ice, or alternate between heat and ice for comfort.  Recommend following up with PCP for reevaluation. Final Clinical Impressions(s) / UC Diagnoses   Final diagnoses:  Non-recurrent acute suppurative otitis media of right ear without spontaneous rupture of tympanic membrane  Lymphadenopathy     Discharge Instructions      Take the amoxicillin twice a day for the next 5 days.   You can take Tylenol and/or Ibuprofen as need for pain relief and fever reduction.   You can apply heat, ice, or alternate between heat and ice for comfort.   Keep an eye on the lymph node  and follow up with your primary care provider for re-evaluation.      ED Prescriptions     Medication Sig Dispense Auth. Provider   amoxicillin (AMOXIL) 500 MG capsule Take 1 capsule (500 mg total) by mouth 2 (two) times daily for 5 days. 10 capsule Ivette Loyal, NP      PDMP not reviewed this encounter.   Ivette Loyal, NP 10/07/20 1250

## 2020-11-19 ENCOUNTER — Other Ambulatory Visit: Payer: Self-pay

## 2020-11-20 ENCOUNTER — Other Ambulatory Visit: Payer: Self-pay

## 2020-11-21 NOTE — Telephone Encounter (Signed)
Patient received Rx in 05/2020. Was advised she would need nurse visit if symptoms returned.

## 2020-11-21 NOTE — Telephone Encounter (Signed)
Duplicate request

## 2020-12-08 ENCOUNTER — Telehealth: Payer: Managed Care, Other (non HMO) | Admitting: Nurse Practitioner

## 2020-12-08 NOTE — Progress Notes (Signed)
  The patient no-showed for appointment despite this provider sending direct link, reaching out via phone with no response and waiting for at least 10 minutes from appointment time for patient to join. They will be marked as a NS for this appointment/time.   Mary-Margaret Lysa Livengood, FNP    

## 2020-12-12 ENCOUNTER — Other Ambulatory Visit: Payer: Self-pay

## 2020-12-13 NOTE — Telephone Encounter (Signed)
Pt has not been seen in the office since 09/2019

## 2020-12-15 ENCOUNTER — Telehealth: Payer: Managed Care, Other (non HMO) | Admitting: Nurse Practitioner

## 2020-12-15 DIAGNOSIS — B3731 Acute candidiasis of vulva and vagina: Secondary | ICD-10-CM

## 2020-12-15 DIAGNOSIS — B9689 Other specified bacterial agents as the cause of diseases classified elsewhere: Secondary | ICD-10-CM

## 2020-12-15 DIAGNOSIS — N760 Acute vaginitis: Secondary | ICD-10-CM | POA: Diagnosis not present

## 2020-12-15 MED ORDER — METRONIDAZOLE 500 MG PO TABS: 500.0000 mg | ORAL_TABLET | Freq: Two times a day (BID) | ORAL | 0 refills | Status: DC

## 2020-12-15 MED ORDER — FLUCONAZOLE 150 MG PO TABS: 150.0000 mg | ORAL_TABLET | Freq: Once | ORAL | 0 refills | Status: AC

## 2020-12-15 NOTE — Progress Notes (Signed)
Virtual Visit Consent   [name], you are scheduled for a virtual visit with a [facility] provider today.     Just as with appointments in the office, your consent must be obtained to participate.  Your consent will be active for this visit and any virtual visit you may have with one of our providers in the next 365 days.     If you have a MyChart account, a copy of this consent can be sent to you electronically.  All virtual visits are billed to your insurance company just like a traditional visit in the office.    As this is a virtual visit, video technology does not allow for your provider to perform a traditional examination.  This may limit your provider's ability to fully assess your condition.  If your provider identifies any concerns that need to be evaluated in person or the need to arrange testing (such as labs, EKG, etc.), we will make arrangements to do so.     Although advances in technology are sophisticated, we cannot ensure that it will always work on either your end or our end.  If the connection with a video visit is poor, the visit may have to be switched to a telephone visit.  With either a video or telephone visit, we are not always able to ensure that we have a secure connection.     I need to obtain your verbal consent now.   Are you willing to proceed with your visit today?    Victoria Robinson has provided verbal consent on 12/15/2020 for a virtual visit (video or telephone).   Claiborne Rigg, NP   Date: 12/15/2020 3:17 PM   Virtual Visit via Video Note   I, Claiborne Rigg, connected with  Victoria Robinson  ([id number], 1985/09/27) on 12/15/20 at  3:15 PM EST by a video-enabled telemedicine application and verified that I am speaking with the correct person using two identifiers.  Location: Patient: Virtual Visit Location Patient: Home Provider: Virtual Visit Location Provider: Home Office   I discussed the limitations of evaluation and management  by telemedicine and the availability of in person appointments. The patient expressed understanding and agreed to proceed.    History of Present Illness: Victoria Robinson is a 35 y.o. who identifies as a female who was assigned female at birth, and is being seen today for yeast and bacterial vaginitis.  HPI: Notes several day onset of malodorous, thick white vaginal discharge with vulvar itching and irritation. Denies any UTI symptoms dysuria, flank pain, frequency, urgency or pelvic pain.   Problems:  Patient Active Problem List   Diagnosis Date Noted   History of herpes genitalis 10/03/2019   Chronic hypertension during pregnancy, antepartum 03/29/2019   Herpetic whitlow 12/09/2017   Obesity 12/27/2012    Allergies: No Known Allergies Medications:  Current Outpatient Medications:    fluconazole (DIFLUCAN) 150 MG tablet, Take 1 tablet (150 mg total) by mouth once for 1 dose., Disp: 1 tablet, Rfl: 0   acetaminophen (TYLENOL) 325 MG tablet, Take 2 tablets (650 mg total) by mouth every 6 (six) hours., Disp: , Rfl:    coconut oil OIL, Apply 1 application topically as needed (nipple pain)., Disp: , Rfl: 0   ferrous gluconate (FERGON) 324 MG tablet, Take 1 tablet (324 mg total) by mouth every other day., Disp: 60 tablet, Rfl: 3   ibuprofen (ADVIL) 800 MG tablet, Take 1 tablet (800 mg total) by mouth every 8 (  eight) hours. (Patient not taking: Reported on 11/15/2019), Disp: 30 tablet, Rfl: 0   metroNIDAZOLE (FLAGYL) 500 MG tablet, Take 1 tablet (500 mg total) by mouth 2 (two) times daily., Disp: 14 tablet, Rfl: 0   NIFEdipine (ADALAT CC) 30 MG 24 hr tablet, Take 1 tablet (30 mg total) by mouth daily., Disp: 45 tablet, Rfl: 1   oxyCODONE (OXY IR/ROXICODONE) 5 MG immediate release tablet, Take 1 tablet (5 mg total) by mouth every 6 (six) hours as needed for severe pain or breakthrough pain. (Patient not taking: Reported on 11/15/2019), Disp: 10 tablet, Rfl: 0   Prenatal Vit w/Fe-Methylfol-FA  (PNV PO), Take 1 tablet by mouth daily. , Disp: , Rfl:    tobramycin (TOBREX) 0.3 % ophthalmic solution, Place 1 drop into the right eye every 6 (six) hours., Disp: 5 mL, Rfl: 0   valACYclovir (VALTREX) 500 MG tablet, Take 1 tablet (500 mg total) by mouth 2 (two) times daily., Disp: 60 tablet, Rfl: 0  Observations/Objective: Patient is well-developed, well-nourished in no acute distress.  Resting comfortably  at home.  Head is normocephalic, atraumatic.  No labored breathing.  Speech is clear and coherent with logical content.  Patient is alert and oriented at baseline.    Assessment and Plan: 1. Bacterial vaginitis - metroNIDAZOLE (FLAGYL) 500 MG tablet; Take 1 tablet (500 mg total) by mouth 2 (two) times daily.  Dispense: 14 tablet; Refill: 0  2. Yeast vaginitis - fluconazole (DIFLUCAN) 150 MG tablet; Take 1 tablet (150 mg total) by mouth once for 1 dose.  Dispense: 1 tablet; Refill: 0    Follow Up Instructions: I discussed the assessment and treatment plan with the patient. The patient was provided an opportunity to ask questions and all were answered. The patient agreed with the plan and demonstrated an understanding of the instructions.  A copy of instructions were sent to the patient via MyChart unless otherwise noted below.     The patient was advised to call back or seek an in-person evaluation if the symptoms worsen or if the condition fails to improve as anticipated.  Time:  I spent 10 minutes with the patient via telehealth technology discussing the above problems/concerns.    Claiborne Rigg, NP

## 2020-12-15 NOTE — Patient Instructions (Signed)
Victoria Robinson, thank you for joining Claiborne Rigg, NP for today's virtual visit.  While this provider is not your primary care provider (PCP), if your PCP is located in our provider database this encounter information will be shared with them immediately following your visit.  Consent: (Patient) Victoria Robinson provided verbal consent for this virtual visit at the beginning of the encounter.  Current Medications:  Current Outpatient Medications:    fluconazole (DIFLUCAN) 150 MG tablet, Take 1 tablet (150 mg total) by mouth once for 1 dose., Disp: 1 tablet, Rfl: 0   acetaminophen (TYLENOL) 325 MG tablet, Take 2 tablets (650 mg total) by mouth every 6 (six) hours., Disp: , Rfl:    coconut oil OIL, Apply 1 application topically as needed (nipple pain)., Disp: , Rfl: 0   ferrous gluconate (FERGON) 324 MG tablet, Take 1 tablet (324 mg total) by mouth every other day., Disp: 60 tablet, Rfl: 3   ibuprofen (ADVIL) 800 MG tablet, Take 1 tablet (800 mg total) by mouth every 8 (eight) hours. (Patient not taking: Reported on 11/15/2019), Disp: 30 tablet, Rfl: 0   metroNIDAZOLE (FLAGYL) 500 MG tablet, Take 1 tablet (500 mg total) by mouth 2 (two) times daily., Disp: 14 tablet, Rfl: 0   NIFEdipine (ADALAT CC) 30 MG 24 hr tablet, Take 1 tablet (30 mg total) by mouth daily., Disp: 45 tablet, Rfl: 1   oxyCODONE (OXY IR/ROXICODONE) 5 MG immediate release tablet, Take 1 tablet (5 mg total) by mouth every 6 (six) hours as needed for severe pain or breakthrough pain. (Patient not taking: Reported on 11/15/2019), Disp: 10 tablet, Rfl: 0   Prenatal Vit w/Fe-Methylfol-FA (PNV PO), Take 1 tablet by mouth daily. , Disp: , Rfl:    tobramycin (TOBREX) 0.3 % ophthalmic solution, Place 1 drop into the right eye every 6 (six) hours., Disp: 5 mL, Rfl: 0   valACYclovir (VALTREX) 500 MG tablet, Take 1 tablet (500 mg total) by mouth 2 (two) times daily., Disp: 60 tablet, Rfl: 0   Medications ordered in this encounter:   Meds ordered this encounter  Medications   metroNIDAZOLE (FLAGYL) 500 MG tablet    Sig: Take 1 tablet (500 mg total) by mouth 2 (two) times daily.    Dispense:  14 tablet    Refill:  0    Order Specific Question:   Supervising Provider    Answer:   MILLER, BRIAN [[id number]]   fluconazole (DIFLUCAN) 150 MG tablet    Sig: Take 1 tablet (150 mg total) by mouth once for 1 dose.    Dispense:  1 tablet    Refill:  0    Order Specific Question:   Supervising Provider    Answer:   Hyacinth Meeker, BRIAN [[id number]]     *If you need refills on other medications prior to your next appointment, please contact your pharmacy*  Follow-Up: Call back or seek an in-person evaluation if the symptoms worsen or if the condition fails to improve as anticipated.     If you have been instructed to have an in-person evaluation today at a local Urgent Care facility, please use the link below. It will take you to a list of all of our available [facility], including address, phone number and hours of operation. Please do not delay care.  [facility]  If you or a family member do not have a primary care provider, use the link below to schedule a visit and establish care. When  you choose a [facility] primary care physician or advanced practice provider, you gain a long-term partner in health. Find a Primary Care Provider  Learn more about 's in-office and virtual care options: [facility]

## 2021-06-25 IMAGING — US US MFM FETAL BPP W/O NON-STRESS
1 series · 15 of 28 positions shown · non-contrast
Comparison: none

[Series 1: us mfm fetal bpp w/o non-stress · 33 acquisitions, 15 frames shown]
[im 1/33]
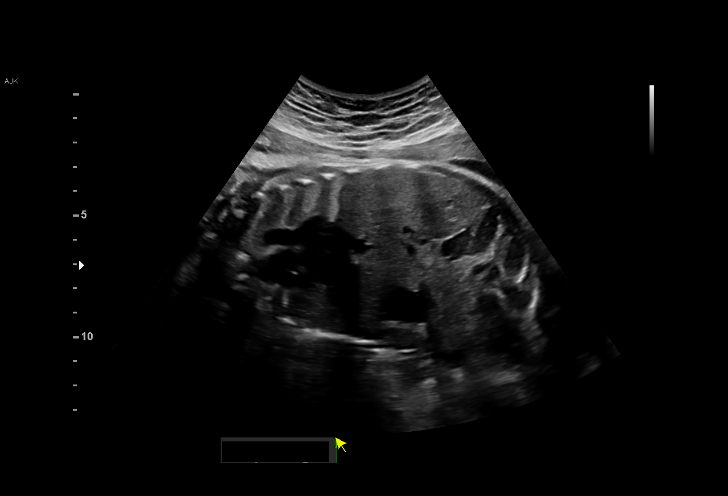
[im 3/33]
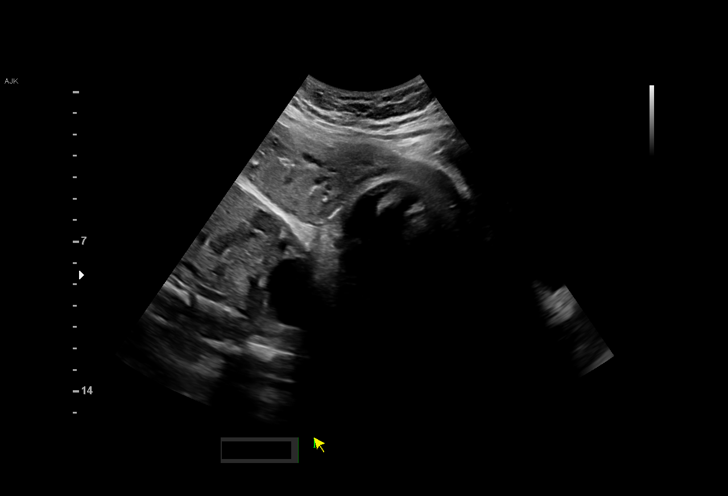
[im 5/33]
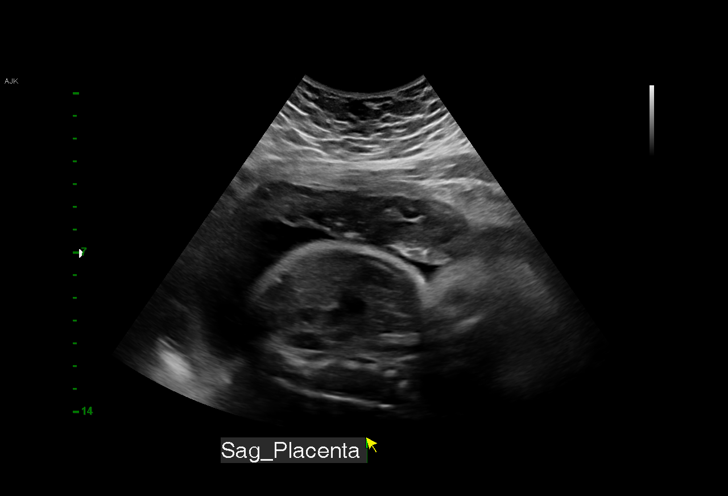
[im 8/33]
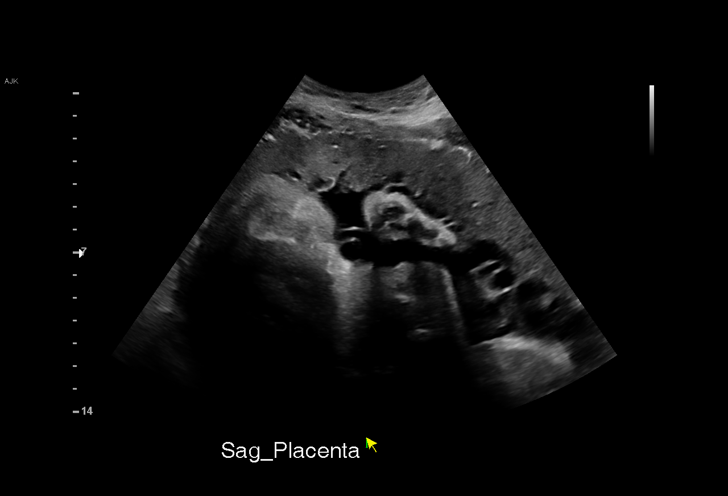
[im 10/33]
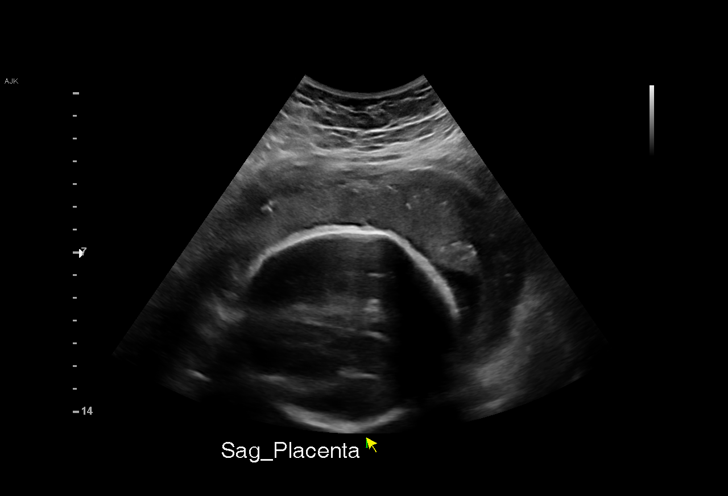
[im 12/33]
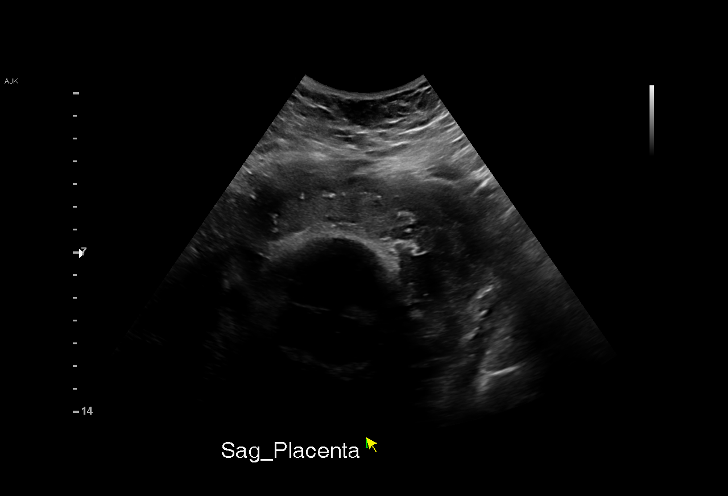
[im 15/33]
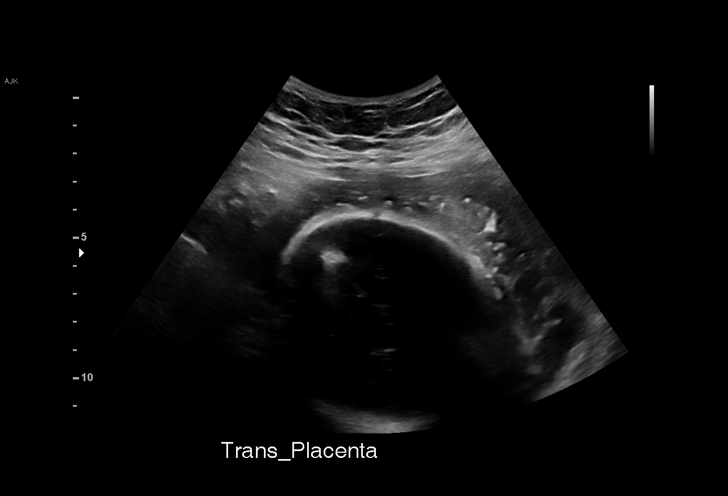
[im 17/33]
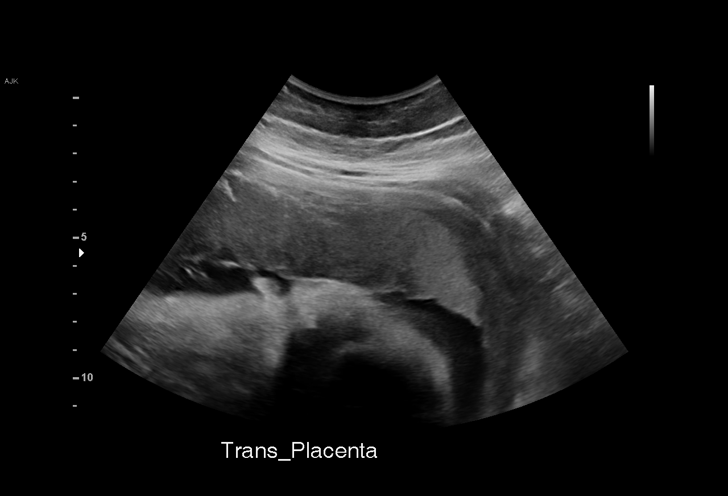
[im 18/33]
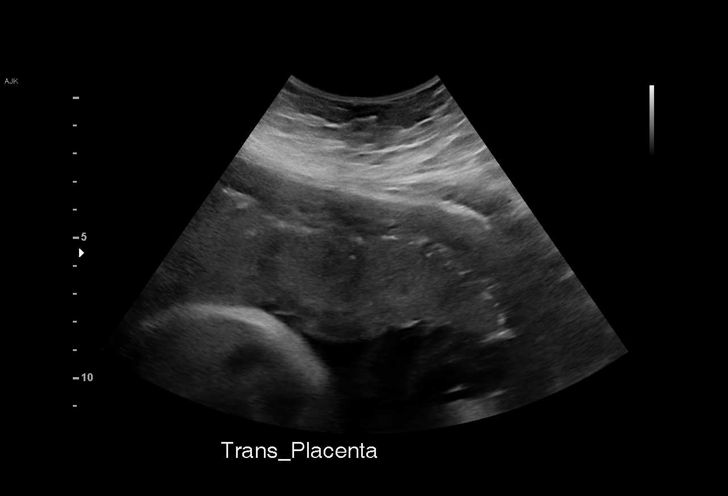
[im 21/33]
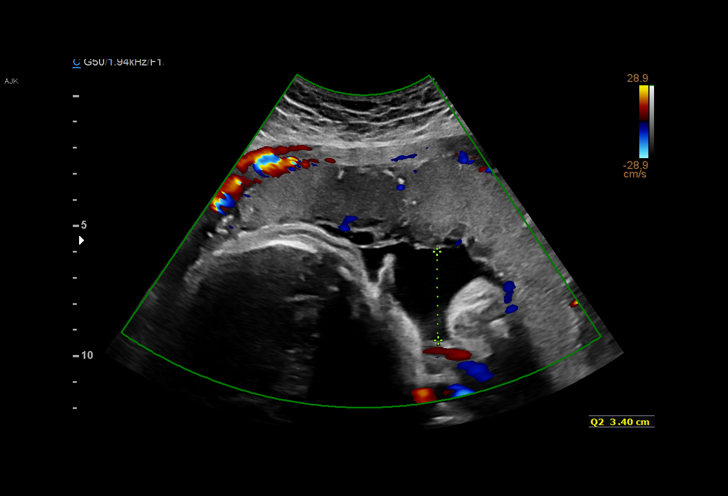
[im 23/33]
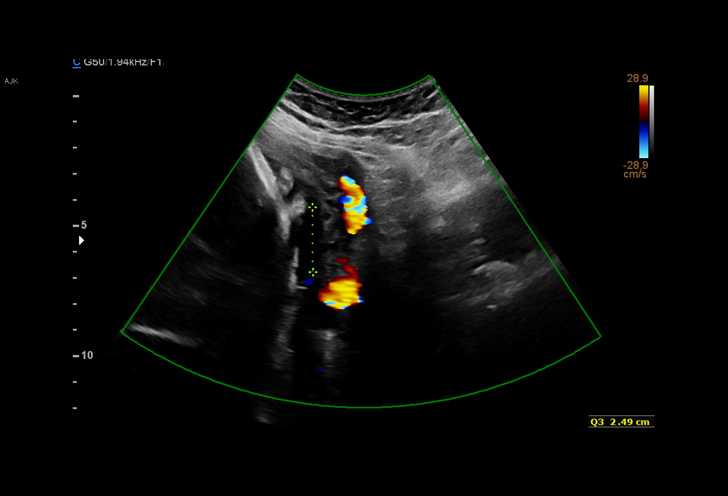
[im 25/33]
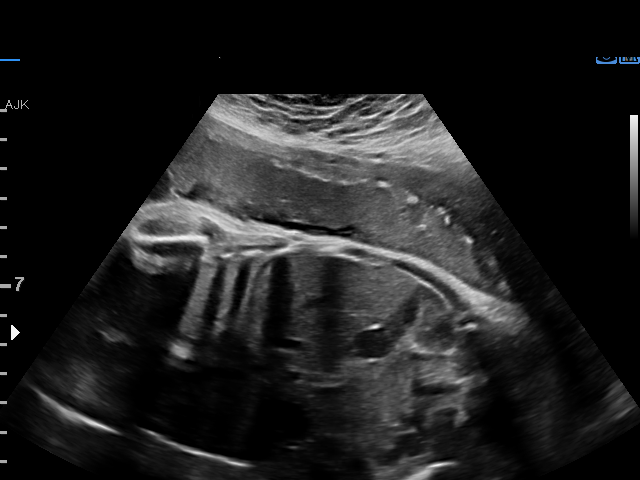
[im 28/33]
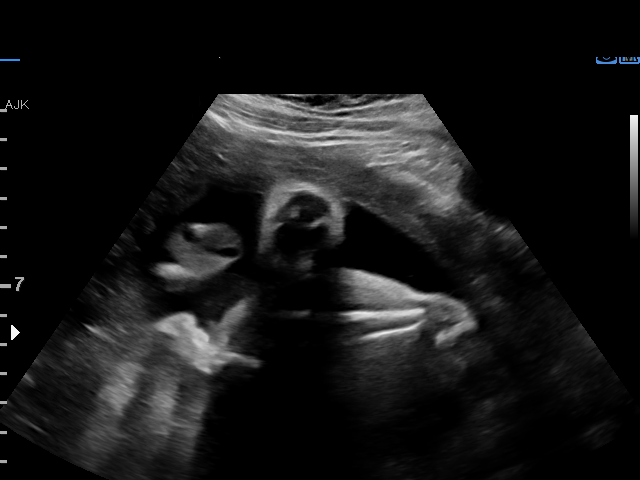
[im 30/33]
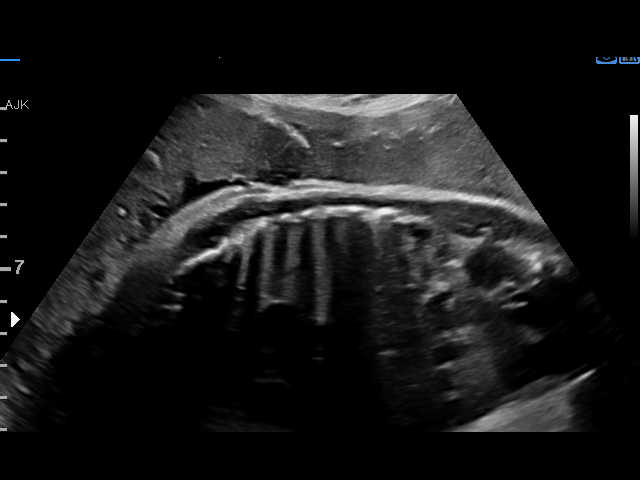
[im 33/33]
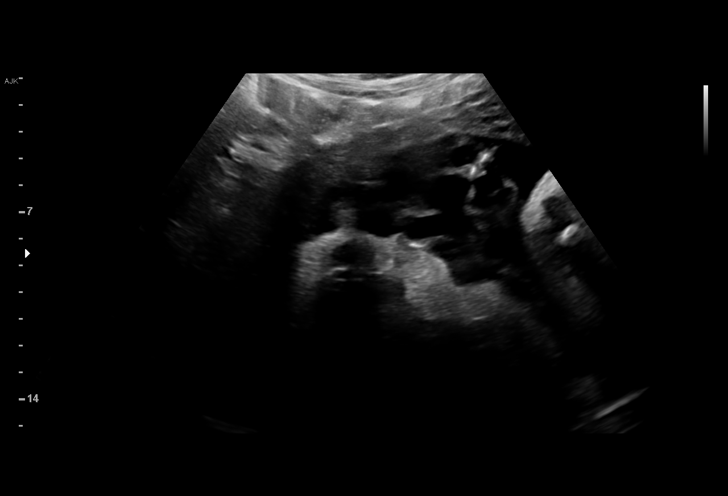

[15 of 28 positions shown; findings below may reference images not displayed]

Indications

 35 weeks gestation of pregnancy
 Hypertension - Chronic/Pre-existing
 (labetalol)
 Advanced maternal age multigravida 35+,
 third trimester
 Maternal morbid obesity (BMI 50)
 Short interval between pregancies, 3rd
 trimester
Fetal Evaluation

 Num Of Fetuses:         1
 Fetal Heart Rate(bpm):  129
 Cardiac Activity:       Observed
 Presentation:           Breech
 Placenta:               Anterior
 P. Cord Insertion:      Visualized, central

 Amniotic Fluid
 AFI FV:      Within normal limits

 AFI Sum(cm)     %Tile       Largest Pocket(cm)
 15.71           57

 RUQ(cm)       RLQ(cm)       LUQ(cm)        LLQ(cm)

Biophysical Evaluation

 Amniotic F.V:   Within normal limits       F. Tone:        Observed
 F. Movement:    Observed                   Score:          [DATE]
 F. Breathing:   Observed
OB History

 Gravidity:    5         Term:   3        Prem:   0        SAB:   1
 TOP:          0       Ectopic:  0        Living: 3
Gestational Age

 LMP:           35w 2d        Date:  01/11/19                 EDD:   10/18/19
 Best:          35w 2d     Det. By:  LMP  (01/11/19)          EDD:   10/18/19
Impression

 Chronic hypertension.  Well controlled on labetalol 200 mg
 twice daily.  Blood pressure today at our office is 132/86
 mmHg.

 Amniotic fluid is normal and good fetal activity is seen
 .Antenatal testing is reassuring. BPP [DATE].  Breech
 presentation.
Recommendations

 -Continue weekly BPP till delivery.
                 [place], [place]

## 2021-07-19 ENCOUNTER — Telehealth: Payer: BC Managed Care – PPO | Admitting: Physician Assistant

## 2021-07-19 DIAGNOSIS — N760 Acute vaginitis: Secondary | ICD-10-CM

## 2021-07-19 DIAGNOSIS — B9689 Other specified bacterial agents as the cause of diseases classified elsewhere: Secondary | ICD-10-CM

## 2021-07-19 MED ORDER — METRONIDAZOLE 500 MG PO TABS: 500.0000 mg | ORAL_TABLET | Freq: Two times a day (BID) | ORAL | 0 refills | Status: AC

## 2021-07-19 NOTE — Patient Instructions (Signed)
Victoria Robinson, thank you for joining Margaretann Loveless, PA-C for today's virtual visit.  While this provider is not your primary care provider (PCP), if your PCP is located in our provider database this encounter information will be shared with them immediately following your visit.  Consent: (Patient) Victoria Robinson provided verbal consent for this virtual visit at the beginning of the encounter.  Current Medications:  Current Outpatient Medications:    metroNIDAZOLE (FLAGYL) 500 MG tablet, Take 1 tablet (500 mg total) by mouth 2 (two) times daily for 7 days., Disp: 14 tablet, Rfl: 0   acetaminophen (TYLENOL) 325 MG tablet, Take 2 tablets (650 mg total) by mouth every 6 (six) hours., Disp: , Rfl:    coconut oil OIL, Apply 1 application topically as needed (nipple pain)., Disp: , Rfl: 0   ferrous gluconate (FERGON) 324 MG tablet, Take 1 tablet (324 mg total) by mouth every other day., Disp: 60 tablet, Rfl: 3   ibuprofen (ADVIL) 800 MG tablet, Take 1 tablet (800 mg total) by mouth every 8 (eight) hours. (Patient not taking: Reported on 11/15/2019), Disp: 30 tablet, Rfl: 0   NIFEdipine (ADALAT CC) 30 MG 24 hr tablet, Take 1 tablet (30 mg total) by mouth daily., Disp: 45 tablet, Rfl: 1   oxyCODONE (OXY IR/ROXICODONE) 5 MG immediate release tablet, Take 1 tablet (5 mg total) by mouth every 6 (six) hours as needed for severe pain or breakthrough pain. (Patient not taking: Reported on 11/15/2019), Disp: 10 tablet, Rfl: 0   Prenatal Vit w/Fe-Methylfol-FA (PNV PO), Take 1 tablet by mouth daily. , Disp: , Rfl:    tobramycin (TOBREX) 0.3 % ophthalmic solution, Place 1 drop into the right eye every 6 (six) hours., Disp: 5 mL, Rfl: 0   valACYclovir (VALTREX) 500 MG tablet, Take 1 tablet (500 mg total) by mouth 2 (two) times daily., Disp: 60 tablet, Rfl: 0   Medications ordered in this encounter:  Meds ordered this encounter  Medications   metroNIDAZOLE (FLAGYL) 500 MG tablet    Sig: Take 1  tablet (500 mg total) by mouth 2 (two) times daily for 7 days.    Dispense:  14 tablet    Refill:  0    Order Specific Question:   Supervising Provider    Answer:   Hyacinth Meeker, BRIAN [[id number]]     *If you need refills on other medications prior to your next appointment, please contact your pharmacy*  Follow-Up: Call back or seek an in-person evaluation if the symptoms worsen or if the condition fails to improve as anticipated.  Other Instructions Bacterial Vaginosis  Bacterial vaginosis is an infection that occurs when the normal balance of bacteria in the vagina changes. This change is caused by an overgrowth of certain bacteria in the vagina. Bacterial vaginosis is the most common vaginal infection among females aged 55 to 77 years. This condition increases the risk of sexually transmitted infections (STIs). Treatment can help reduce this risk. Treatment is very important for pregnant women because this condition can cause babies to be born early (prematurely) or at a low birth weight. What are the causes? This condition is caused by an increase in harmful bacteria that are normally present in small amounts in the vagina. However, the exact reason this condition develops is not known. You cannot get bacterial vaginosis from toilet seats, bedding, swimming pools, or contact with objects around you. What increases the risk? The following factors may make you more likely to develop this condition: Having  a new sexual partner or multiple sexual partners, or having unprotected sex. Douching. Having an intrauterine device (IUD). Smoking. Abusing drugs and alcohol. This may lead to riskier sexual behavior. Taking certain antibiotic medicines. Being pregnant. What are the signs or symptoms? Some women with this condition have no symptoms. Symptoms may include: Wallace Cullens or white vaginal discharge. The discharge can be watery or foamy. A fish-like odor with discharge, especially after sex or during  menstruation. Itching in and around the vagina. Burning or pain with urination. How is this diagnosed? This condition is diagnosed based on: Your medical history. A physical exam of the vagina. Checking a sample of vaginal fluid for harmful bacteria or abnormal cells. How is this treated? This condition is treated with antibiotic medicines. These may be given as a pill, a vaginal cream, or a medicine that is put into the vagina (suppository). If the condition comes back after treatment, a second round of antibiotics may be needed. Follow these instructions at home: Medicines Take or apply over-the-counter and prescription medicines only as told by your health care provider. Take or apply your antibiotic medicine as told by your health care provider. Do not stop using the antibiotic even if you start to feel better. General instructions If you have a female sexual partner, tell her that you have a vaginal infection. She should follow up with her health care provider. If you have a female sexual partner, he does not need treatment. Avoid sexual activity until you finish treatment. Drink enough fluid to keep your urine pale yellow. Keep the area around your vagina and rectum clean. Wash the area daily with warm water. Wipe yourself from front to back after using the toilet. If you are breastfeeding, talk to your health care provider about continuing breastfeeding during treatment. Keep all follow-up visits. This is important. How is this prevented? Self-care Do not douche. Wash the outside of your vagina with warm water only. Wear cotton or cotton-lined underwear. Avoid wearing tight pants and pantyhose, especially during the summer. Safe sex Use protection when having sex. This includes: Using condoms. Using dental dams. This is a thin layer of a material made of latex or polyurethane that protects the mouth during oral sex. Limit the number of sexual partners. To help prevent bacterial  vaginosis, it is best to have sex with just one partner (monogamous relationship). Make sure you and your sexual partner are tested for STIs. Drugs and alcohol Do not use any products that contain nicotine or tobacco. These products include cigarettes, chewing tobacco, and vaping devices, such as e-cigarettes. If you need help quitting, ask your health care provider. Do not use drugs. Do not drink alcohol if: Your health care provider tells you not to do this. You are pregnant, may be pregnant, or are planning to become pregnant. If you drink alcohol: Limit how much you have to 0-1 drink a day. Be aware of how much alcohol is in your drink. In the [place] one drink equals one 12 oz bottle of beer (355 mL), one 5 oz glass of wine (148 mL), or one 1 oz glass of hard liquor (44 mL). Where to find more information Centers for Disease Control and Prevention: FootballExhibition.com.br American Sexual Health Association (ASHA): www.ashastd.org U.S. Department of Health and Health and safety inspector, Office on Women's Health: http://hoffman.com/ Contact a health care provider if: Your symptoms do not improve, even after treatment. You have more discharge or pain when urinating. You have a fever or chills. You have pain in  your abdomen or pelvis. You have pain during sex. You have vaginal bleeding between menstrual periods. Summary Bacterial vaginosis is a vaginal infection that occurs when the normal balance of bacteria in the vagina changes. It results from an overgrowth of certain bacteria. This condition increases the risk of sexually transmitted infections (STIs). Getting treated can help reduce this risk. Treatment is very important for pregnant women because this condition can cause babies to be born early (prematurely) or at low birth weight. This condition is treated with antibiotic medicines. These may be given as a pill, a vaginal cream, or a medicine that is put into the vagina (suppository). This information  is not intended to replace advice given to you by your health care provider. Make sure you discuss any questions you have with your health care provider. Document Revised: 07/21/2019 Document Reviewed: 07/21/2019 Elsevier Patient Education  2023 Elsevier Inc.    If you have been instructed to have an in-person evaluation today at a local Urgent Care facility, please use the link below. It will take you to a list of all of our available [facility], including address, phone number and hours of operation. Please do not delay care.  [facility]  If you or a family member do not have a primary care provider, use the link below to schedule a visit and establish care. When you choose a [facility] primary care physician or advanced practice provider, you gain a long-term partner in health. Find a Primary Care Provider  Learn more about [facility]'s in-office and virtual care options: [facility] - Get Care Now

## 2021-07-19 NOTE — Progress Notes (Signed)
Virtual Visit Consent   Victoria Robinson, you are scheduled for a virtual visit with a [facility] provider today. Just as with appointments in the office, your consent must be obtained to participate. Your consent will be active for this visit and any virtual visit you may have with one of our providers in the next 365 days. If you have a MyChart account, a copy of this consent can be sent to you electronically.  As this is a virtual visit, video technology does not allow for your provider to perform a traditional examination. This may limit your provider's ability to fully assess your condition. If your provider identifies any concerns that need to be evaluated in person or the need to arrange testing (such as labs, EKG, etc.), we will make arrangements to do so. Although advances in technology are sophisticated, we cannot ensure that it will always work on either your end or our end. If the connection with a video visit is poor, the visit may have to be switched to a telephone visit. With either a video or telephone visit, we are not always able to ensure that we have a secure connection.  By engaging in this virtual visit, you consent to the provision of healthcare and authorize for your insurance to be billed (if applicable) for the services provided during this visit. Depending on your insurance coverage, you may receive a charge related to this service.  I need to obtain your verbal consent now. Are you willing to proceed with your visit today? Victoria Robinson has provided verbal consent on 07/19/2021 for a virtual visit (video or telephone). Margaretann Loveless, PA-C  Date: 07/19/2021 7:19 PM  Virtual Visit via Video Note   I, Margaretann Loveless, connected with  Victoria Robinson  ([id number], 1985-04-20) on 07/19/21 at  7:15 PM EDT by a video-enabled telemedicine application and verified that I am speaking with the correct person using two identifiers.  Location: Patient: Virtual Visit  Location Patient: Home Provider: Virtual Visit Location Provider: Home Office   I discussed the limitations of evaluation and management by telemedicine and the availability of in person appointments. The patient expressed understanding and agreed to proceed.    History of Present Illness: Victoria Robinson is a 36 y.o. who identifies as a female who was assigned female at birth, and is being seen today for possible bacterial vaginosis.  HPI: Vaginal Discharge The patient's primary symptoms include a genital odor and vaginal discharge. The patient's pertinent negatives include no genital itching, genital lesions or genital rash. This is a recurrent problem. The current episode started in the past 7 days (Sunday; reports new body wash that onset symptoms). The problem occurs constantly. The problem has been gradually worsening. The patient is experiencing no pain. Pertinent negatives include no back pain, chills, constipation, diarrhea, fever, nausea or vomiting. The vaginal discharge was malodorous. Nothing aggravates the symptoms. She has tried nothing for the symptoms. The treatment provided no relief.    Problems:  Patient Active Problem List   Diagnosis Date Noted   History of herpes genitalis 10/03/2019   Chronic hypertension during pregnancy, antepartum 03/29/2019   Herpetic whitlow 12/09/2017   Obesity 12/27/2012    Allergies: No Known Allergies Medications:  Current Outpatient Medications:    metroNIDAZOLE (FLAGYL) 500 MG tablet, Take 1 tablet (500 mg total) by mouth 2 (two) times daily for 7 days., Disp: 14 tablet, Rfl: 0   acetaminophen (TYLENOL) 325 MG tablet, Take 2 tablets (650  mg total) by mouth every 6 (six) hours., Disp: , Rfl:    coconut oil OIL, Apply 1 application topically as needed (nipple pain)., Disp: , Rfl: 0   ferrous gluconate (FERGON) 324 MG tablet, Take 1 tablet (324 mg total) by mouth every other day., Disp: 60 tablet, Rfl: 3   ibuprofen (ADVIL) 800 MG tablet,  Take 1 tablet (800 mg total) by mouth every 8 (eight) hours. (Patient not taking: Reported on 11/15/2019), Disp: 30 tablet, Rfl: 0   NIFEdipine (ADALAT CC) 30 MG 24 hr tablet, Take 1 tablet (30 mg total) by mouth daily., Disp: 45 tablet, Rfl: 1   oxyCODONE (OXY IR/ROXICODONE) 5 MG immediate release tablet, Take 1 tablet (5 mg total) by mouth every 6 (six) hours as needed for severe pain or breakthrough pain. (Patient not taking: Reported on 11/15/2019), Disp: 10 tablet, Rfl: 0   Prenatal Vit w/Fe-Methylfol-FA (PNV PO), Take 1 tablet by mouth daily. , Disp: , Rfl:    tobramycin (TOBREX) 0.3 % ophthalmic solution, Place 1 drop into the right eye every 6 (six) hours., Disp: 5 mL, Rfl: 0   valACYclovir (VALTREX) 500 MG tablet, Take 1 tablet (500 mg total) by mouth 2 (two) times daily., Disp: 60 tablet, Rfl: 0  Observations/Objective: Patient is well-developed, well-nourished in no acute distress.  Resting comfortably at home.  Head is normocephalic, atraumatic.  No labored breathing.  Speech is clear and coherent with logical content.  Patient is alert and oriented at baseline.    Assessment and Plan: 1. BV (bacterial vaginosis) - metroNIDAZOLE (FLAGYL) 500 MG tablet; Take 1 tablet (500 mg total) by mouth 2 (two) times daily for 7 days.  Dispense: 14 tablet; Refill: 0  - Recurrent issue - Metronidazole prescribed - Seek in person evaluation if not improving or if worsening  Follow Up Instructions: I discussed the assessment and treatment plan with the patient. The patient was provided an opportunity to ask questions and all were answered. The patient agreed with the plan and demonstrated an understanding of the instructions.  A copy of instructions were sent to the patient via MyChart unless otherwise noted below.    The patient was advised to call back or seek an in-person evaluation if the symptoms worsen or if the condition fails to improve as anticipated.  Time:  I spent 8 minutes with  the patient via telehealth technology discussing the above problems/concerns.    Margaretann Loveless, PA-C

## 2022-01-14 DIAGNOSIS — F17210 Nicotine dependence, cigarettes, uncomplicated: Secondary | ICD-10-CM | POA: Diagnosis not present

## 2022-01-14 DIAGNOSIS — U071 COVID-19: Secondary | ICD-10-CM | POA: Diagnosis not present

## 2022-01-14 DIAGNOSIS — R509 Fever, unspecified: Secondary | ICD-10-CM | POA: Diagnosis not present

## 2022-01-14 DIAGNOSIS — R059 Cough, unspecified: Secondary | ICD-10-CM | POA: Diagnosis not present

## 2022-02-22 ENCOUNTER — Other Ambulatory Visit: Payer: Self-pay

## 2022-02-22 ENCOUNTER — Emergency Department (HOSPITAL_BASED_OUTPATIENT_CLINIC_OR_DEPARTMENT_OTHER)
Admission: EM | Admit: 2022-02-22 | Discharge: 2022-02-22 | Disposition: A | Discharge status: Home / Self Care | Payer: BC Managed Care – PPO | Attending: Emergency Medicine | Admitting: Emergency Medicine

## 2022-02-22 ENCOUNTER — Encounter (HOSPITAL_BASED_OUTPATIENT_CLINIC_OR_DEPARTMENT_OTHER): Payer: Self-pay | Admitting: Emergency Medicine

## 2022-02-22 DIAGNOSIS — R109 Unspecified abdominal pain: Secondary | ICD-10-CM | POA: Diagnosis not present

## 2022-02-22 DIAGNOSIS — K5900 Constipation, unspecified: Secondary | ICD-10-CM | POA: Diagnosis not present

## 2022-02-22 DIAGNOSIS — R1032 Left lower quadrant pain: Secondary | ICD-10-CM

## 2022-02-22 LAB — URINALYSIS, ROUTINE W REFLEX MICROSCOPIC
Bilirubin Urine: NEGATIVE
Glucose, UA: NEGATIVE mg/dL
Hgb urine dipstick: NEGATIVE
Ketones, ur: NEGATIVE mg/dL
Leukocytes,Ua: NEGATIVE
Nitrite: NEGATIVE
Specific Gravity, Urine: 1.018 (ref 1.005–1.030)
pH: 6 (ref 5.0–8.0)

## 2022-02-22 LAB — CBC WITH DIFFERENTIAL/PLATELET
Abs Immature Granulocytes: 0.01 10*3/uL (ref 0.00–0.07)
Basophils Absolute: 0 10*3/uL (ref 0.0–0.1)
Basophils Relative: 0 %
Eosinophils Absolute: 0.1 10*3/uL (ref 0.0–0.5)
Eosinophils Relative: 1 %
HCT: 37.2 % (ref 36.0–46.0)
Hemoglobin: 12 g/dL (ref 12.0–15.0)
Immature Granulocytes: 0 %
Lymphocytes Relative: 33 %
Lymphs Abs: 1.7 10*3/uL (ref 0.7–4.0)
MCH: 29.7 pg (ref 26.0–34.0)
MCHC: 32.3 g/dL (ref 30.0–36.0)
MCV: 92.1 fL (ref 80.0–100.0)
Monocytes Absolute: 0.4 10*3/uL (ref 0.1–1.0)
Monocytes Relative: 7 %
Neutro Abs: 3 10*3/uL (ref 1.7–7.7)
Neutrophils Relative %: 59 %
Platelets: 271 10*3/uL (ref 150–400)
RBC: 4.04 MIL/uL (ref 3.87–5.11)
RDW: 13.1 % (ref 11.5–15.5)
WBC: 5.1 10*3/uL (ref 4.0–10.5)
nRBC: 0 % (ref 0.0–0.2)

## 2022-02-22 LAB — COMPREHENSIVE METABOLIC PANEL
ALT: 11 U/L (ref 0–44)
AST: 15 U/L (ref 15–41)
Albumin: 4.1 g/dL (ref 3.5–5.0)
Alkaline Phosphatase: 43 U/L (ref 38–126)
Anion gap: 7 (ref 5–15)
BUN: 12 mg/dL (ref 6–20)
CO2: 27 mmol/L (ref 22–32)
Calcium: 9.1 mg/dL (ref 8.9–10.3)
Chloride: 102 mmol/L (ref 98–111)
Creatinine, Ser: 1.07 mg/dL — ABNORMAL HIGH (ref 0.44–1.00)
GFR, Estimated: >60 mL/min (ref >60)
Glucose, Bld: 81 mg/dL (ref 70–99)
Potassium: 4.1 mmol/L (ref 3.5–5.1)
Sodium: 136 mmol/L (ref 135–145)
Total Bilirubin: 0.5 mg/dL (ref 0.3–1.2)
Total Protein: 7.6 g/dL (ref 6.5–8.1)

## 2022-02-22 LAB — PREGNANCY, URINE: Preg Test, Ur: NEGATIVE

## 2022-02-22 LAB — LIPASE, BLOOD: Lipase: <10 U/L — ABNORMAL LOW (ref 11–51)

## 2022-02-22 NOTE — ED Triage Notes (Signed)
Patient c/o "On Wednesday my side starting hurting." Patient points to left upper abdomen area and expresses that pain radiates across. Pt last bowel movement was last night after an enema, but reports only small amount.

## 2022-02-22 NOTE — Discharge Instructions (Addendum)
As we discussed, I suspect that your pain is due to constipation.  I recommend that you mix 8 capfulls of MiraLAX in Gatorade and drink it in under 1 hour on a day when you do not have anything planned and expect to have several subsequent bowel movements throughout the day.  You can also take an Ex-Lax chocolate square to help with this. You can also get a fleets enema over-the-counter as well.  Follow-up with your primary care doctor for continued evaluation and management of your symptoms.  Return if development of any new or worsening symptoms.

## 2022-02-22 NOTE — ED Provider Notes (Signed)
[facility] Provider Note   CSN: [id number] Arrival date & time: 02/22/22  1145     History  Chief Complaint  Patient presents with   Abdominal Pain    Victoria Robinson is a 37 y.o. female.  Patient with no pertinent past medical history presents today with complaints of abdominal pain.  She states that same began persistent since then.  She states that her pain is on the left side of her head does not radiate.  It has not changed in nature since onset.  She denies any nausea, vomiting, or diarrhea.  Denies any history of similar pain previously.  Denies any history of abdominal surgeries.  She states that her bowel movements have been more regular.  States that she has not had a full bowel movement 1 week.  States she has had several bowel movements that are small and require straining. She did try an enema last night but only had a small hard bowel movement.  He denies fevers, chills, hematuria, or dysuria.  No vaginal discharge or vaginal bleeding. She has been passing flatus normally.  The history is provided by the patient. No language interpreter was used.  Abdominal Pain      Home Medications Prior to Admission medications   Medication Sig Start Date End Date Taking? Authorizing Provider  acetaminophen (TYLENOL) 325 MG tablet Take 2 tablets (650 mg total) by mouth every 6 (six) hours. 10/05/19   Randa Ngo, MD  coconut oil OIL Apply 1 application topically as needed (nipple pain). 10/05/19   Randa Ngo, MD  ferrous gluconate (FERGON) 324 MG tablet Take 1 tablet (324 mg total) by mouth every other day. 10/06/19   Randa Ngo, MD  ibuprofen (ADVIL) 800 MG tablet Take 1 tablet (800 mg total) by mouth every 8 (eight) hours. Patient not taking: Reported on 11/15/2019 10/06/19   Randa Ngo, MD  NIFEdipine (ADALAT CC) 30 MG 24 hr tablet Take 1 tablet (30 mg total) by mouth daily. 10/05/19   Randa Ngo, MD  oxyCODONE (OXY  IR/ROXICODONE) 5 MG immediate release tablet Take 1 tablet (5 mg total) by mouth every 6 (six) hours as needed for severe pain or breakthrough pain. Patient not taking: Reported on 11/15/2019 10/05/19   Randa Ngo, MD  Prenatal Vit w/Fe-Methylfol-FA (PNV PO) Take 1 tablet by mouth daily.     [provider]  tobramycin (TOBREX) 0.3 % ophthalmic solution Place 1 drop into the right eye every 6 (six) hours. 07/04/20   Vanessa Kick, MD  valACYclovir (VALTREX) 500 MG tablet Take 1 tablet (500 mg total) by mouth 2 (two) times daily. 11/15/19   Nugent, Gerrie Nordmann, NP      Allergies    Patient has no known allergies.    Review of Systems   Review of Systems  Gastrointestinal:  Positive for abdominal pain.  All other systems reviewed and are negative.   Physical Exam Updated Vital Signs BP (!) 149/98 (BP Location: Left Arm)   Pulse 98   Temp 98.5 F (36.9 C) (Oral)   Resp 18   Ht 5' 1.5" (1.562 m)   Wt 107 kg   LMP 02/15/2022   SpO2 100%   Breastfeeding No   BMI 43.87 kg/m  Physical Exam Vitals and nursing note reviewed.  Constitutional:      General: She is not in acute distress.    Appearance: Normal appearance. She is well-developed. She is  obese. She is not ill-appearing, toxic-appearing or diaphoretic.  HENT:     Head: Normocephalic and atraumatic.  Cardiovascular:     Rate and Rhythm: Normal rate and regular rhythm.     Heart sounds: Normal heart sounds.  Pulmonary:     Effort: Pulmonary effort is normal. No respiratory distress.     Breath sounds: Normal breath sounds.  Abdominal:     General: Abdomen is flat.     Palpations: Abdomen is soft.     Comments: Mild left sided tenderness to deep palpation  Musculoskeletal:        General: Normal range of motion.     Cervical back: Normal range of motion.  Skin:    General: Skin is warm and dry.  Neurological:     General: No focal deficit present.     Mental Status: She is alert.  Psychiatric:        Mood  and Affect: Mood normal.        Behavior: Behavior normal.     ED Results / Procedures / Treatments   Labs (all labs ordered are listed, but only abnormal results are displayed) Labs Reviewed  COMPREHENSIVE METABOLIC PANEL - Abnormal; Notable for the following components:      Result Value   Creatinine, Ser 1.07 (*)    All other components within normal limits  LIPASE, BLOOD - Abnormal; Notable for the following components:   Lipase <10 (*)    All other components within normal limits  URINALYSIS, ROUTINE W REFLEX MICROSCOPIC - Abnormal; Notable for the following components:   Protein, ur TRACE (*)    All other components within normal limits  CBC WITH DIFFERENTIAL/PLATELET  PREGNANCY, URINE    EKG None  Radiology No results found.  Procedures Procedures    Medications Ordered in ED Medications - No data to display  ED Course/ Medical Decision Making/ A&P                             Medical Decision Making Amount and/or Complexity of Data Reviewed Labs: ordered.   This patient is a 37 y.o. female who presents to the ED for concern of abdominal pain, this involves an extensive number of treatment options, and is a complaint that carries with it a high risk of complications and morbidity. The emergent differential diagnosis prior to evaluation includes, but is not limited to,  The differential diagnosis for generalized abdominal pain includes, but is not limited to AAA, gastroenteritis, appendicitis, Bowel obstruction, Bowel perforation. Gastroparesis, DKA, Hernia, Inflammatory bowel disease, mesenteric ischemia, pancreatitis, peritonitis SBP, volvulus.  This is not an exhaustive differential.   Past Medical History / Co-morbidities / Social History: none  Physical Exam: Physical exam performed. The pertinent findings include: Mild tenderness to palpation of the left side of the abdomen  Lab Tests: I ordered, and personally interpreted labs.  The pertinent  results include:  Creatinine 1.07, proteinuria. No other acute laboratory findings     Disposition:  Patient presents today with left-sided abdominal pain x 3 days.  Patient is nontoxic, nonseptic appearing, in no apparent distress with reassuring vital signs.  Physical exam reveals mild tenderness to palpation of the left side of the abdomen.  Labs and vitals reviewed.  Patient does not meet the SIRS or Sepsis criteria.  On repeat exam patient does not have a surgical abdomin and there are no peritoneal signs.  No indication of appendicitis, bowel obstruction, bowel  perforation, cholecystitis, diverticulitis, PID or ectopic pregnancy.  Given patient's description of symptoms, I suspect that her pain is due to constipation.  Shared decision making implemented for further evaluation with CT imaging vs bowel regimen and observation, patient states that she prefers to defer imaging at this time. This is reasonable given the mild nature of symptoms and benign laboratory evaluation. Patient discharged home with Miralax bowel regimen for symptomatic treatment and given strict instructions for follow-up with their primary care physician.  I have also discussed reasons to return immediately to the ER.  Patient expresses understanding and agrees with plan.  Patient discharged in stable condition.   Final Clinical Impression(s) / ED Diagnoses Final diagnoses:  Left lower quadrant abdominal pain  Constipation, unspecified constipation type    Rx / DC Orders ED Discharge Orders     None     An After Visit Summary was printed and given to the patient.     Nestor Lewandowsky 02/22/22 1446    Malvin Johns, MD 02/22/22 1447

## 2022-10-18 ENCOUNTER — Telehealth: Payer: BC Managed Care – PPO | Admitting: Family Medicine

## 2022-10-18 DIAGNOSIS — B9689 Other specified bacterial agents as the cause of diseases classified elsewhere: Secondary | ICD-10-CM | POA: Diagnosis not present

## 2022-10-18 DIAGNOSIS — N760 Acute vaginitis: Secondary | ICD-10-CM | POA: Diagnosis not present

## 2022-10-18 MED ORDER — METRONIDAZOLE 500 MG PO TABS: 500.0000 mg | ORAL_TABLET | Freq: Three times a day (TID) | ORAL | 0 refills | Status: AC

## 2022-10-18 NOTE — Progress Notes (Signed)

## 2022-12-23 ENCOUNTER — Encounter: Payer: Self-pay | Admitting: Podiatry

## 2022-12-23 ENCOUNTER — Ambulatory Visit (INDEPENDENT_AMBULATORY_CARE_PROVIDER_SITE_OTHER): Payer: 59

## 2022-12-23 ENCOUNTER — Ambulatory Visit (INDEPENDENT_AMBULATORY_CARE_PROVIDER_SITE_OTHER): Payer: 59 | Admitting: Podiatry

## 2022-12-23 VITALS — Ht 61.5 in | Wt 236.0 lb

## 2022-12-23 DIAGNOSIS — M2141 Flat foot [pes planus] (acquired), right foot: Secondary | ICD-10-CM

## 2022-12-23 DIAGNOSIS — M2142 Flat foot [pes planus] (acquired), left foot: Secondary | ICD-10-CM | POA: Diagnosis not present

## 2022-12-23 DIAGNOSIS — M7752 Other enthesopathy of left foot: Secondary | ICD-10-CM

## 2022-12-23 DIAGNOSIS — M7751 Other enthesopathy of right foot: Secondary | ICD-10-CM

## 2022-12-23 DIAGNOSIS — M722 Plantar fascial fibromatosis: Secondary | ICD-10-CM | POA: Diagnosis not present

## 2022-12-23 DIAGNOSIS — M778 Other enthesopathies, not elsewhere classified: Secondary | ICD-10-CM

## 2022-12-23 NOTE — Progress Notes (Signed)
Subjective:  Patient ID: Victoria Robinson, female    DOB: 11/10/1985,  MRN: [id number]  Chief Complaint  Patient presents with   Foot Pain    Patient is here for bilateral foot pain and would like injection, severe sharp heel pain up the achillies    Discussed the use of AI scribe software for clinical note transcription with the patient, who gave verbal consent to proceed.  History of Present Illness   The patient, with a history of bilateral plantar fasciitis, presents with a flare-up of heel pain. The pain is worse in the morning and after walking her large dog or hiking. The pain is more severe in the left heel, but also present in the right. The patient has had similar symptoms a couple of years ago, which were successfully treated with bilateral injections. The relief from the previous injections lasted about a year and a half to two years. The patient also reports pain in the Achilles tendon. She does not currently use any orthotics or inserts, and does not take any anti-inflammatory medications. The patient is trying to be more active and lose weight.          Objective:    Physical Exam   MUSCULOSKELETAL: Bilateral plantar fasciitis at the insertion of the medial band of the plantar fascia with pain on palpation. Pain localized to the Achilles tendon on palpation. Pes planus deformity noted. Bilateral warm and well-perfused feet with palpable pulses. No posterior Achilles pain. Normal sensation. X-rays of both feet show pes planus deformity with bilateral heel spurs, plantar and posterior.       No images are attached to the encounter.    Results   Procedure: Corticosteroid injection Description: Injection administered bilaterally to the plantar fascia with 10mg  kenalog, 2mg  dexamethasone and 0.5cc each of lidocaine 2% and marcaine 0.5%. Ethyl chloride spray applied to the skin. Sharp pinch, burning, pressure, and tingling noted for about ten seconds on each side. Adhesive  bandage applied post-injection. Informed Consent: Counseled on potential ecchymosis, erythema, and edema post-injection, which should not last more than twenty-four hours. Discussed the goal of achieving fifty to sixty percent improvement in five to six weeks. Advised to contact if not at target improvement for potential additional injection.  RADIOLOGY X-rays of both feet: Pes planus deformity with bilateral heel spurs, plantar and posterior (12/23/2022)      Assessment:   1. Plantar fasciitis, bilateral   2. Pes planus of both feet      Plan:  Patient was evaluated and treated and all questions answered.  Assessment and Plan    Plantar Fasciitis   She presents with bilateral heel pain, more severe on the left, which worsens with physical activity. Her flat-footedness is contributing to the strain on her plantar fascia. Previous treatments with steroid injections were successful. We will administer bilateral steroid injections today and start Meloxicam for 3-4 weeks, then as needed. Home physical therapy exercises will be initiated, and an appointment for custom orthotic fitting is scheduled. Weight loss will be considered to reduce strain on the plantar fascia. If there is no improvement in 5-6 weeks, an additional injection may be considered. Should chronic plantar fasciitis develop, we will consider an MRI and surgical options.  Footwear   Her current house shoes may not provide sufficient cushion and arch support. We recommend considering the purchase of Vionic or [employer] brand footwear for improved support and cushioning.          Return if symptoms worsen  or fail to improve.

## 2022-12-23 NOTE — Patient Instructions (Addendum)
VISIT SUMMARY:  Today, you were seen for a flare-up of heel pain, which is worse in the morning and after physical activities like walking your dog or hiking. You have a history of bilateral plantar fasciitis, and similar symptoms were successfully treated with injections a couple of years ago. You also reported pain in your Achilles tendon. You are not currently using any orthotics or taking anti-inflammatory medications, and you are trying to be more active and lose weight.  YOUR PLAN:  -PLANTAR FASCIITIS: Plantar fasciitis is a condition where the thick band of tissue on the bottom of your foot becomes inflamed, causing heel pain. We administered steroid injections in both heels today and prescribed Meloxicam for 3-4 weeks to help reduce inflammation. You should start home physical therapy exercises and have an appointment scheduled for custom orthotic fitting. Weight loss will also help reduce the strain on your plantar fascia. If there is no improvement in 5-6 weeks, we may consider another injection. For chronic cases, an MRI and surgical options may be considered.  -FOOTWEAR: Your current house shoes may not provide enough cushion and arch support, which can worsen your heel pain. We recommend considering Vionic or O[employer]os brand footwear for better support and cushioning.  INSTRUCTIONS:  Please follow up in 5-6 weeks to assess your progress. If there is no improvement, we may consider another injection. Continue with the prescribed Meloxicam, start home physical therapy exercises, and attend your appointment for custom orthotic fitting. Consider weight loss to help reduce strain on your plantar fascia.  Plantar Fasciitis (Heel Spur Syndrome) with Rehab The plantar fascia is a fibrous, ligament-like, soft-tissue structure that spans the bottom of the foot. Plantar fasciitis is a condition that causes pain in the foot due to inflammation of the tissue. SYMPTOMS  Pain and tenderness on the  underneath side of the foot. Pain that worsens with standing or walking. CAUSES  Plantar fasciitis is caused by irritation and injury to the plantar fascia on the underneath side of the foot. Common mechanisms of injury include: Direct trauma to bottom of the foot. Damage to a small nerve that runs under the foot where the main fascia attaches to the heel bone. Stress placed on the plantar fascia due to bone spurs. RISK INCREASES WITH:  Activities that place stress on the plantar fascia (running, jumping, pivoting, or cutting). Poor strength and flexibility. Improperly fitted shoes. Tight calf muscles. Flat feet. Failure to warm-up properly before activity. Obesity. PREVENTION Warm up and stretch properly before activity. Allow for adequate recovery between workouts. Maintain physical fitness: Strength, flexibility, and endurance. Cardiovascular fitness. Maintain a health body weight. Avoid stress on the plantar fascia. Wear properly fitted shoes, including arch supports for individuals who have flat feet.  PROGNOSIS  If treated properly, then the symptoms of plantar fasciitis usually resolve without surgery. However, occasionally surgery is necessary.  RELATED COMPLICATIONS  Recurrent symptoms that may result in a chronic condition. Problems of the lower back that are caused by compensating for the injury, such as limping. Pain or weakness of the foot during push-off following surgery. Chronic inflammation, scarring, and partial or complete fascia tear, occurring more often from repeated injections.  TREATMENT  Treatment initially involves the use of ice and medication to help reduce pain and inflammation. The use of strengthening and stretching exercises may help reduce pain with activity, especially stretches of the Achilles tendon. These exercises may be performed at home or with a therapist. Your caregiver may recommend that you use heel  cups of arch supports to help reduce  stress on the plantar fascia. Occasionally, corticosteroid injections are given to reduce inflammation. If symptoms persist for greater than 6 months despite non-surgical (conservative), then surgery may be recommended.   MEDICATION  If pain medication is necessary, then nonsteroidal anti-inflammatory medications, such as aspirin and ibuprofen, or other minor pain relievers, such as acetaminophen, are often recommended. Do not take pain medication within 7 days before surgery. Prescription pain relievers may be given if deemed necessary by your caregiver. Use only as directed and only as much as you need. Corticosteroid injections may be given by your caregiver. These injections should be reserved for the most serious cases, because they may only be given a certain number of times.  HEAT AND COLD Cold treatment (icing) relieves pain and reduces inflammation. Cold treatment should be applied for 10 to 15 minutes every 2 to 3 hours for inflammation and pain and immediately after any activity that aggravates your symptoms. Use ice packs or massage the area with a piece of ice (ice massage). Heat treatment may be used prior to performing the stretching and strengthening activities prescribed by your caregiver, physical therapist, or athletic trainer. Use a heat pack or soak the injury in warm water.  SEEK IMMEDIATE MEDICAL CARE IF: Treatment seems to offer no benefit, or the condition worsens. Any medications produce adverse side effects.  EXERCISES- RANGE OF MOTION (ROM) AND STRETCHING EXERCISES - Plantar Fasciitis (Heel Spur Syndrome) These exercises may help you when beginning to rehabilitate your injury. Your symptoms may resolve with or without further involvement from your physician, physical therapist or athletic trainer. While completing these exercises, remember:  Restoring tissue flexibility helps normal motion to return to the joints. This allows healthier, less painful movement and  activity. An effective stretch should be held for at least 30 seconds. A stretch should never be painful. You should only feel a gentle lengthening or release in the stretched tissue.  RANGE OF MOTION - Toe Extension, Flexion Sit with your right / left leg crossed over your opposite knee. Grasp your toes and gently pull them back toward the top of your foot. You should feel a stretch on the bottom of your toes and/or foot. Hold this stretch for 10 seconds. Now, gently pull your toes toward the bottom of your foot. You should feel a stretch on the top of your toes and or foot. Hold this stretch for 10 seconds. Repeat  times. Complete this stretch 3 times per day.   RANGE OF MOTION - Ankle Dorsiflexion, Active Assisted Remove shoes and sit on a chair that is preferably not on a carpeted surface. Place right / left foot under knee. Extend your opposite leg for support. Keeping your heel down, slide your right / left foot back toward the chair until you feel a stretch at your ankle or calf. If you do not feel a stretch, slide your bottom forward to the edge of the chair, while still keeping your heel down. Hold this stretch for 10 seconds. Repeat 3 times. Complete this stretch 2 times per day.   STRETCH  Gastroc, Standing Place hands on wall. Extend right / left leg, keeping the front knee somewhat bent. Slightly point your toes inward on your back foot. Keeping your right / left heel on the floor and your knee straight, shift your weight toward the wall, not allowing your back to arch. You should feel a gentle stretch in the right / left calf. Hold this  position for 10 seconds. Repeat 3 times. Complete this stretch 2 times per day.  STRETCH  Soleus, Standing Place hands on wall. Extend right / left leg, keeping the other knee somewhat bent. Slightly point your toes inward on your back foot. Keep your right / left heel on the floor, bend your back knee, and slightly shift your weight over  the back leg so that you feel a gentle stretch deep in your back calf. Hold this position for 10 seconds. Repeat 3 times. Complete this stretch 2 times per day.  STRETCH  Gastrocsoleus, Standing  Note: This exercise can place a lot of stress on your foot and ankle. Please complete this exercise only if specifically instructed by your caregiver.  Place the ball of your right / left foot on a step, keeping your other foot firmly on the same step. Hold on to the wall or a rail for balance. Slowly lift your other foot, allowing your body weight to press your heel down over the edge of the step. You should feel a stretch in your right / left calf. Hold this position for 10 seconds. Repeat this exercise with a slight bend in your right / left knee. Repeat 3 times. Complete this stretch 2 times per day.   STRENGTHENING EXERCISES - Plantar Fasciitis (Heel Spur Syndrome)  These exercises may help you when beginning to rehabilitate your injury. They may resolve your symptoms with or without further involvement from your physician, physical therapist or athletic trainer. While completing these exercises, remember:  Muscles can gain both the endurance and the strength needed for everyday activities through controlled exercises. Complete these exercises as instructed by your physician, physical therapist or athletic trainer. Progress the resistance and repetitions only as guided.  STRENGTH - Towel Curls Sit in a chair positioned on a non-carpeted surface. Place your foot on a towel, keeping your heel on the floor. Pull the towel toward your heel by only curling your toes. Keep your heel on the floor. Repeat 3 times. Complete this exercise 2 times per day.  STRENGTH - Ankle Inversion Secure one end of a rubber exercise band/tubing to a fixed object (table, pole). Loop the other end around your foot just before your toes. Place your fists between your knees. This will focus your strengthening at your  ankle. Slowly, pull your big toe up and in, making sure the band/tubing is positioned to resist the entire motion. Hold this position for 10 seconds. Have your muscles resist the band/tubing as it slowly pulls your foot back to the starting position. Repeat 3 times. Complete this exercises 2 times per day.  Document Released: 01/20/2005 Document Revised: 04/14/2011 Document Reviewed: 05/04/2008 [facility] Patient Information 2014 [place], [place].

## 2023-02-20 ENCOUNTER — Telehealth: Payer: BC Managed Care – PPO | Admitting: Emergency Medicine

## 2023-02-20 DIAGNOSIS — R052 Subacute cough: Secondary | ICD-10-CM

## 2023-02-20 MED ORDER — DOXYCYCLINE HYCLATE 100 MG PO CAPS: 100.0000 mg | ORAL_CAPSULE | Freq: Two times a day (BID) | ORAL | 0 refills | Status: AC

## 2023-02-20 MED ORDER — BENZONATATE 100 MG PO CAPS: 100.0000 mg | ORAL_CAPSULE | Freq: Two times a day (BID) | ORAL | 0 refills | Status: AC | PRN

## 2023-02-20 NOTE — Patient Instructions (Signed)
Victoria Robinson, thank you for joining [name], PA-C for today's virtual visit.  While this provider is not your primary care provider (PCP), if your PCP is located in our provider database this encounter information will be shared with them immediately following your visit.   A [facility] MyChart account gives you access to today's visit and all your visits, tests, and labs performed at [facility] " click here if you don't have a [facility] MyChart account or go to [email or web address]  Consent: (Patient) Victoria Robinson provided verbal consent for this virtual visit at the beginning of the encounter.  Current Medications:  Current Outpatient Medications:    benzonatate (TESSALON) 100 MG capsule, Take 1 capsule (100 mg total) by mouth 2 (two) times daily as needed for cough., Disp: 20 capsule, Rfl: 0   doxycycline (VIBRAMYCIN) 100 MG capsule, Take 1 capsule (100 mg total) by mouth 2 (two) times daily., Disp: 20 capsule, Rfl: 0   acetaminophen (TYLENOL) 325 MG tablet, Take 2 tablets (650 mg total) by mouth every 6 (six) hours., Disp: , Rfl:    coconut oil OIL, Apply 1 application topically as needed (nipple pain)., Disp: , Rfl: 0   ferrous gluconate (FERGON) 324 MG tablet, Take 1 tablet (324 mg total) by mouth every other day., Disp: 60 tablet, Rfl: 3   ibuprofen (ADVIL) 800 MG tablet, Take 1 tablet (800 mg total) by mouth every 8 (eight) hours., Disp: 30 tablet, Rfl: 0   NIFEdipine (ADALAT CC) 30 MG 24 hr tablet, Take 1 tablet (30 mg total) by mouth daily., Disp: 45 tablet, Rfl: 1   oxyCODONE (OXY IR/ROXICODONE) 5 MG immediate release tablet, Take 1 tablet (5 mg total) by mouth every 6 (six) hours as needed for severe pain or breakthrough pain., Disp: 10 tablet, Rfl: 0   Prenatal Vit w/Fe-Methylfol-FA (PNV PO), Take 1 tablet by mouth daily. , Disp: , Rfl:    tobramycin (TOBREX) 0.3 % ophthalmic solution, Place 1 drop into the right eye every 6 (six) hours.,  Disp: 5 mL, Rfl: 0   valACYclovir (VALTREX) 500 MG tablet, Take 1 tablet (500 mg total) by mouth 2 (two) times daily., Disp: 60 tablet, Rfl: 0   Medications ordered in this encounter:  Meds ordered this encounter  Medications   benzonatate (TESSALON) 100 MG capsule    Sig: Take 1 capsule (100 mg total) by mouth 2 (two) times daily as needed for cough.    Dispense:  20 capsule    Refill:  0    Supervising Provider:   Merrilee Jansky [[id number]]   doxycycline (VIBRAMYCIN) 100 MG capsule    Sig: Take 1 capsule (100 mg total) by mouth 2 (two) times daily.    Dispense:  20 capsule    Refill:  0    Supervising Provider:   Merrilee Jansky [[id number]]     *If you need refills on other medications prior to your next appointment, please contact your pharmacy*  Follow-Up: Call back or seek an in-person evaluation if the symptoms worsen or if the condition fails to improve as anticipated.  [facility] [phone]  Other Instructions    If you have been instructed to have an in-person evaluation today at a local Urgent Care facility, please use the link below. It will take you to a list of all of our available [facility], including address, phone number and hours of operation. Please do not delay care.    Urgent Cares  If you or a family member do not have a primary care provider, use the link below to schedule a visit and establish care. When you choose a [facility] primary care physician or advanced practice provider, you gain a long-term partner in health. Find a Primary Care Provider  Learn more about [facility]'s in-office and virtual care options: [facility] - Get Care Now

## 2023-02-20 NOTE — Progress Notes (Signed)
Virtual Visit Consent   [name], you are scheduled for a virtual visit with a [facility] provider today. Just as with appointments in the office, your consent must be obtained to participate. Your consent will be active for this visit and any virtual visit you may have with one of our providers in the next 365 days. If you have a MyChart account, a copy of this consent can be sent to you electronically.  As this is a virtual visit, video technology does not allow for your provider to perform a traditional examination. This may limit your provider's ability to fully assess your condition. If your provider identifies any concerns that need to be evaluated in person or the need to arrange testing (such as labs, EKG, etc.), we will make arrangements to do so. Although advances in technology are sophisticated, we cannot ensure that it will always work on either your end or our end. If the connection with a video visit is poor, the visit may have to be switched to a telephone visit. With either a video or telephone visit, we are not always able to ensure that we have a secure connection.  By engaging in this virtual visit, you consent to the provision of healthcare and authorize for your insurance to be billed (if applicable) for the services provided during this visit. Depending on your insurance coverage, you may receive a charge related to this service.  I need to obtain your verbal consent now. Are you willing to proceed with your visit today? Victoria Robinson has provided verbal consent on 02/20/2023 for a virtual visit (video or telephone). [name], PA-C  Date: 02/20/2023 10:18 AM  Virtual Visit via Video Note   I, [name], connected with  Victoria Robinson  ([id number], Feb 20, 1985) on 02/20/23 at 10:15 AM EST by a video-enabled telemedicine application and verified that I am speaking with the correct person using two identifiers.  Location: Patient: Virtual Visit  Location Patient: Home Provider: Virtual Visit Location Provider: Home Office   I discussed the limitations of evaluation and management by telemedicine and the availability of in person appointments. The patient expressed understanding and agreed to proceed.    History of Present Illness: Victoria Robinson is a 38 y.o. who identifies as a female who was assigned female at birth, and is being seen today for cold symptoms for the past several weeks.  She reports lingering cough.  Exacerbated by vaping, which she has tried to quit.  Has a sharp pain on the right side with inhaling.  Has tried taking mucinex at home without much relief.  Denies fever.  Has tried OTC cough and cold medicine. She denies medication allergies.  Denies pregnancy or breast feeding.Marland Kitchen  HPI: HPI  Problems:  Patient Active Problem List   Diagnosis Date Noted   History of herpes genitalis 10/03/2019   Chronic hypertension during pregnancy, antepartum 03/29/2019   Herpetic whitlow 12/09/2017   Obesity 12/27/2012    Allergies: No Known Allergies Medications:  Current Outpatient Medications:    benzonatate (TESSALON) 100 MG capsule, Take 1 capsule (100 mg total) by mouth 2 (two) times daily as needed for cough., Disp: 20 capsule, Rfl: 0   doxycycline (VIBRAMYCIN) 100 MG capsule, Take 1 capsule (100 mg total) by mouth 2 (two) times daily., Disp: 20 capsule, Rfl: 0   acetaminophen (TYLENOL) 325 MG tablet, Take 2 tablets (650 mg total) by mouth every 6 (six) hours., Disp: , Rfl:    coconut oil  OIL, Apply 1 application topically as needed (nipple pain)., Disp: , Rfl: 0   ferrous gluconate (FERGON) 324 MG tablet, Take 1 tablet (324 mg total) by mouth every other day., Disp: 60 tablet, Rfl: 3   ibuprofen (ADVIL) 800 MG tablet, Take 1 tablet (800 mg total) by mouth every 8 (eight) hours., Disp: 30 tablet, Rfl: 0   NIFEdipine (ADALAT CC) 30 MG 24 hr tablet, Take 1 tablet (30 mg total) by mouth daily., Disp: 45 tablet, Rfl: 1    oxyCODONE (OXY IR/ROXICODONE) 5 MG immediate release tablet, Take 1 tablet (5 mg total) by mouth every 6 (six) hours as needed for severe pain or breakthrough pain., Disp: 10 tablet, Rfl: 0   Prenatal Vit w/Fe-Methylfol-FA (PNV PO), Take 1 tablet by mouth daily. , Disp: , Rfl:    tobramycin (TOBREX) 0.3 % ophthalmic solution, Place 1 drop into the right eye every 6 (six) hours., Disp: 5 mL, Rfl: 0   valACYclovir (VALTREX) 500 MG tablet, Take 1 tablet (500 mg total) by mouth 2 (two) times daily., Disp: 60 tablet, Rfl: 0  Observations/Objective: Patient is well-developed, well-nourished in no acute distress.  Resting comfortably  at home.  Head is normocephalic, atraumatic.  No labored breathing.  Speech is clear and coherent with logical content.  Patient is alert and oriented at baseline.    Assessment and Plan: 1. Subacute cough (Primary)  Cough for several weeks. Has some associated rib pain.  Might be 2/2 intercostal strain from cough.  Will give tessalon.  Will trial some doxy given length of illness.  Meds ordered this encounter  Medications   benzonatate (TESSALON) 100 MG capsule    Sig: Take 1 capsule (100 mg total) by mouth 2 (two) times daily as needed for cough.    Dispense:  20 capsule    Refill:  0    Supervising Provider:   Merrilee Jansky [[id number]]   doxycycline (VIBRAMYCIN) 100 MG capsule    Sig: Take 1 capsule (100 mg total) by mouth 2 (two) times daily.    Dispense:  20 capsule    Refill:  0    Supervising Provider:   Merrilee Jansky [[id number]]     Follow Up Instructions: I discussed the assessment and treatment plan with the patient. The patient was provided an opportunity to ask questions and all were answered. The patient agreed with the plan and demonstrated an understanding of the instructions.  A copy of instructions were sent to the patient via MyChart unless otherwise noted below.     The patient was advised to call back or seek an in-person  evaluation if the symptoms worsen or if the condition fails to improve as anticipated.    [name], PA-C

## 2023-08-02 ENCOUNTER — Other Ambulatory Visit: Payer: Self-pay

## 2023-08-02 ENCOUNTER — Emergency Department (HOSPITAL_BASED_OUTPATIENT_CLINIC_OR_DEPARTMENT_OTHER)
Admission: EM | Admit: 2023-08-02 | Discharge: 2023-08-02 | Disposition: A | Discharge status: Home / Self Care | Attending: Emergency Medicine | Admitting: Emergency Medicine

## 2023-08-02 ENCOUNTER — Emergency Department (HOSPITAL_BASED_OUTPATIENT_CLINIC_OR_DEPARTMENT_OTHER)

## 2023-08-02 ENCOUNTER — Encounter (HOSPITAL_BASED_OUTPATIENT_CLINIC_OR_DEPARTMENT_OTHER): Payer: Self-pay | Admitting: Emergency Medicine

## 2023-08-02 DIAGNOSIS — J45909 Unspecified asthma, uncomplicated: Secondary | ICD-10-CM | POA: Diagnosis not present

## 2023-08-02 DIAGNOSIS — R1084 Generalized abdominal pain: Secondary | ICD-10-CM | POA: Insufficient documentation

## 2023-08-02 DIAGNOSIS — K5730 Diverticulosis of large intestine without perforation or abscess without bleeding: Secondary | ICD-10-CM | POA: Diagnosis not present

## 2023-08-02 DIAGNOSIS — R112 Nausea with vomiting, unspecified: Secondary | ICD-10-CM | POA: Insufficient documentation

## 2023-08-02 DIAGNOSIS — R109 Unspecified abdominal pain: Secondary | ICD-10-CM | POA: Diagnosis not present

## 2023-08-02 DIAGNOSIS — I10 Essential (primary) hypertension: Secondary | ICD-10-CM | POA: Diagnosis not present

## 2023-08-02 DIAGNOSIS — R197 Diarrhea, unspecified: Secondary | ICD-10-CM | POA: Diagnosis not present

## 2023-08-02 DIAGNOSIS — K429 Umbilical hernia without obstruction or gangrene: Secondary | ICD-10-CM | POA: Diagnosis not present

## 2023-08-02 DIAGNOSIS — R160 Hepatomegaly, not elsewhere classified: Secondary | ICD-10-CM | POA: Diagnosis not present

## 2023-08-02 LAB — COMPREHENSIVE METABOLIC PANEL WITH GFR
ALT: 11 U/L (ref 0–44)
AST: 19 U/L (ref 15–41)
Albumin: 4.1 g/dL (ref 3.5–5.0)
Alkaline Phosphatase: 63 U/L (ref 38–126)
Anion gap: 11 (ref 5–15)
BUN: 12 mg/dL (ref 6–20)
CO2: 25 mmol/L (ref 22–32)
Calcium: 9.1 mg/dL (ref 8.9–10.3)
Chloride: 101 mmol/L (ref 98–111)
Creatinine, Ser: 1.07 mg/dL — ABNORMAL HIGH (ref 0.44–1.00)
GFR, Estimated: >60 mL/min (ref >60)
Glucose, Bld: 96 mg/dL (ref 70–99)
Potassium: 3.5 mmol/L (ref 3.5–5.1)
Sodium: 138 mmol/L (ref 135–145)
Total Bilirubin: <0.2 mg/dL (ref 0.0–1.2)
Total Protein: 7 g/dL (ref 6.5–8.1)

## 2023-08-02 LAB — URINALYSIS, MICROSCOPIC (REFLEX)

## 2023-08-02 LAB — URINALYSIS, ROUTINE W REFLEX MICROSCOPIC
Bilirubin Urine: NEGATIVE
Glucose, UA: NEGATIVE mg/dL
Ketones, ur: NEGATIVE mg/dL
Leukocytes,Ua: NEGATIVE
Nitrite: NEGATIVE
Protein, ur: NEGATIVE mg/dL
Specific Gravity, Urine: 1.01 (ref 1.005–1.030)
pH: 5.5 (ref 5.0–8.0)

## 2023-08-02 LAB — CBC WITH DIFFERENTIAL/PLATELET
Abs Immature Granulocytes: 0.01 10*3/uL (ref 0.00–0.07)
Basophils Absolute: 0 10*3/uL (ref 0.0–0.1)
Basophils Relative: 1 %
Eosinophils Absolute: 0.1 10*3/uL (ref 0.0–0.5)
Eosinophils Relative: 2 %
HCT: 35.7 % — ABNORMAL LOW (ref 36.0–46.0)
Hemoglobin: 11.8 g/dL — ABNORMAL LOW (ref 12.0–15.0)
Immature Granulocytes: 0 %
Lymphocytes Relative: 36 %
Lymphs Abs: 1.8 10*3/uL (ref 0.7–4.0)
MCH: 29.3 pg (ref 26.0–34.0)
MCHC: 33.1 g/dL (ref 30.0–36.0)
MCV: 88.6 fL (ref 80.0–100.0)
Monocytes Absolute: 0.4 10*3/uL (ref 0.1–1.0)
Monocytes Relative: 8 %
Neutro Abs: 2.7 10*3/uL (ref 1.7–7.7)
Neutrophils Relative %: 53 %
Platelets: 193 10*3/uL (ref 150–400)
RBC: 4.03 MIL/uL (ref 3.87–5.11)
RDW: 13 % (ref 11.5–15.5)
WBC: 5 10*3/uL (ref 4.0–10.5)
nRBC: 0 % (ref 0.0–0.2)

## 2023-08-02 LAB — TROPONIN T, HIGH SENSITIVITY: Troponin T High Sensitivity: <15 ng/L (ref <19)

## 2023-08-02 LAB — LIPASE, BLOOD: Lipase: 24 U/L (ref 11–51)

## 2023-08-02 MED ORDER — SODIUM CHLORIDE 0.9 % IV BOLUS
1000.0000 mL | Freq: Once | INTRAVENOUS | Status: AC
  Administered 2023-08-02: 1000 mL via INTRAVENOUS

## 2023-08-02 MED ORDER — MORPHINE SULFATE (PF) 4 MG/ML IV SOLN
4.0000 mg | Freq: Once | INTRAVENOUS | Status: DC
  Filled 2023-08-02: qty 1

## 2023-08-02 MED ORDER — ONDANSETRON 4 MG PO TBDP: 4.0000 mg | ORAL_TABLET | Freq: Three times a day (TID) | ORAL | 0 refills | Status: AC | PRN

## 2023-08-02 MED ORDER — DICYCLOMINE HCL 20 MG PO TABS: 20.0000 mg | ORAL_TABLET | Freq: Three times a day (TID) | ORAL | 0 refills | Status: AC | PRN

## 2023-08-02 MED ORDER — LOPERAMIDE HCL 2 MG PO CAPS: 2.0000 mg | ORAL_CAPSULE | Freq: Four times a day (QID) | ORAL | 0 refills | Status: AC | PRN

## 2023-08-02 MED ORDER — ONDANSETRON HCL 4 MG/2ML IJ SOLN
4.0000 mg | Freq: Once | INTRAMUSCULAR | Status: AC
  Administered 2023-08-02: 4 mg via INTRAVENOUS
  Filled 2023-08-02: qty 2

## 2023-08-02 MED ORDER — DICYCLOMINE HCL 10 MG PO CAPS
10.0000 mg | ORAL_CAPSULE | Freq: Once | ORAL | Status: AC
  Administered 2023-08-02: 10 mg via ORAL
  Filled 2023-08-02: qty 1

## 2023-08-02 MED ORDER — LOPERAMIDE HCL 2 MG PO CAPS
4.0000 mg | ORAL_CAPSULE | Freq: Once | ORAL | Status: AC
  Administered 2023-08-02: 4 mg via ORAL
  Filled 2023-08-02: qty 2

## 2023-08-02 MED ORDER — IOHEXOL 300 MG/ML  SOLN
100.0000 mL | Freq: Once | INTRAMUSCULAR | Status: AC | PRN
  Administered 2023-08-02: 100 mL via INTRAVENOUS

## 2023-08-02 NOTE — ED Triage Notes (Addendum)
 Pt c/o diarrhea since Tues after eating and has continued; c/o diffuse abd pain that concentrates in RUQ; pain increases with movement, turning to the side, +nausea

## 2023-08-02 NOTE — ED Provider Notes (Signed)
 Emergency Department Provider Note   I have reviewed the triage vital signs and the nursing notes.   HISTORY  Chief Complaint Abdominal Pain   HPI Victoria Robinson is a 38 y.o. female who presents emergency department for evaluation of diffuse abdominal pain with nausea and diarrhea.  Symptoms been ongoing for the past 4 to 5 days.  No fevers.  No blood in the stool.  Pain radiates into the mid abdomen and right upper quadrant.  No chest pain.  No sick contacts.  Symptoms began after eating [employer].   Past Medical History:  Diagnosis Date   Abnormal Pap smear    repeat WNL   Asthma childhood   History of pre-eclampsia    Hypertension    Shortness of breath    Supervision of other normal pregnancy, antepartum 03/29/2019    Nursing Staff Provider Office Location  [place] Dating  LMP Language  English Anatomy US   normal Flu Vaccine  Declined 03/29/19 Genetic Screen  NIPS:low risks   AFP: neg  TDaP vaccine  Given 07/26/19 Hgb A1C or  GTT Early A1c 5.2 Third trimester nl 2 hour Rhogam     LAB RESULTS  Feeding Plan Breast/Bottle Blood Type O/Positive/-- (02/23 1639)  Contraception Tubal Antibody Negative (02/23 1639) Circu    Review of Systems  Constitutional: No fever/chills Cardiovascular: Denies chest pain. Respiratory: Denies shortness of breath. Gastrointestinal: Positive abdominal pain. Positive nausea, no vomiting. Positive diarrhea.  Genitourinary: Negative for dysuria. Musculoskeletal: Negative for back pain. Skin: Negative for rash. Neurological: Negative for headaches.  ____________________________________________   PHYSICAL EXAM:  VITAL SIGNS: ED Triage Vitals  Encounter Vitals Group     BP 08/02/23 2042 (!) 154/106     Pulse Rate 08/02/23 2042 96     Resp 08/02/23 2042 20     Temp 08/02/23 2042 97.7 F (36.5 C)     Temp Source 08/02/23 2042 Temporal     SpO2 08/02/23 2042 98 %     Weight 08/02/23 2042 258 lb (117 kg)     Height 08/02/23 2042 5' 1.5  (1.562 m)   Constitutional: Alert and oriented. Well appearing and in no acute distress. Eyes: Conjunctivae are normal.  Head: Atraumatic. Nose: No congestion/rhinnorhea. Mouth/Throat: Mucous membranes are moist. Neck: No stridor.   Cardiovascular: Normal rate, regular rhythm. Good peripheral circulation. Grossly normal heart sounds.   Respiratory: Normal respiratory effort.  No retractions. Lungs CTAB. Gastrointestinal: Soft with mild diffuse tenderness. Negative Murphy's sign. No distention.  Musculoskeletal: No lower extremity tenderness nor edema. No gross deformities of extremities. Neurologic:  Normal speech and language. No gross focal neurologic deficits are appreciated.  Skin:  Skin is warm, dry and intact. No rash noted.  ____________________________________________   LABS (all labs ordered are listed, but only abnormal results are displayed)  Labs Reviewed  COMPREHENSIVE METABOLIC PANEL WITH GFR - Abnormal; Notable for the following components:      Result Value   Creatinine, Ser 1.07 (*)    All other components within normal limits  CBC WITH DIFFERENTIAL/PLATELET - Abnormal; Notable for the following components:   Hemoglobin 11.8 (*)    HCT 35.7 (*)    All other components within normal limits  URINALYSIS, ROUTINE W REFLEX MICROSCOPIC - Abnormal; Notable for the following components:   Hgb urine dipstick TRACE (*)    All other components within normal limits  URINALYSIS, MICROSCOPIC (REFLEX) - Abnormal; Notable for the following components:   Bacteria, UA RARE (*)  All other components within normal limits  LIPASE, BLOOD  TROPONIN T, HIGH SENSITIVITY  TROPONIN T, HIGH SENSITIVITY   ____________________________________________  EKG   EKG Interpretation Date/Time:  Sunday August 02 2023 21:18:53 EDT Ventricular Rate:  88 PR Interval:  242 QRS Duration:  108 QT Interval:  391 QTC Calculation: 474 R Axis:   72  Text Interpretation: Sinus rhythm Prolonged PR  interval Probable left atrial enlargement RSR' in V1 or V2, right VCD or RVH Confirmed by Darra Chew [phone]) on 08/02/2023 9:59:50 PM        ____________________________________________  RADIOLOGY  CT ABDOMEN PELVIS W CONTRAST Result Date: 08/02/2023 CLINICAL DATA:  Abdominal pain, acute, nonlocalized, with diarrhea x6 days. States pain is greater in the right upper abdomen than elsewhere. EXAM: CT ABDOMEN AND PELVIS WITH CONTRAST TECHNIQUE: Multidetector CT imaging of the abdomen and pelvis was performed using the standard protocol following bolus administration of intravenous contrast. RADIATION DOSE REDUCTION: This exam was performed according to the departmental dose-optimization program which includes automated exposure control, adjustment of the mA and/or kV according to patient size and/or use of iterative reconstruction technique. CONTRAST:  100mL OMNIPAQUE IOHEXOL 300 MG/ML  SOLN COMPARISON:  None. FINDINGS: Lower chest: There is mild cardiomegaly. No pericardial effusion. Calcified granuloma and adjacent scarring right lower lobe. There is linear scarring in the anterior left lower lobe. No focal lung base infiltrate. Hepatobiliary: Large liver, measures 23 cm length, diffuse mild steatosis without mass enhancement. Unremarkable gallbladder and bile ducts. Pancreas: No abnormality. Spleen: No mass.  No splenomegaly.  Numerous calcified granulomas. Adrenals/Urinary Tract: There is no adrenal or renal mass enhancement. Contrast in the collecting systems could obscure stones if present but there is no hydronephrosis no visible ureteral filling defect. There is contrast in the bladder, with unremarkable bladder for the degree of distension. Stomach/Bowel: Unremarkable stomach. Unremarkable unopacified small bowel. No mesenteric inflammatory changes. An appendix is not seen in this patient. There are scattered colonic diverticula but no evidence of focal diverticulitis or colitis. The large bowel is  mostly empty. Vascular/Lymphatic: No significant vascular findings are present. No enlarged abdominal or pelvic lymph nodes. Reproductive: Uterus and bilateral adnexa are unremarkable. Other: Small umbilical fat hernia. No incarcerated hernia. No free fluid, free hemorrhage, free air or focal inflammatory process. Musculoskeletal: No symmetric changes of nonerosive chronic appearing bilateral sacroiliitis. Transitional L5 with bilateral sacral articulations. There is an adjacent-segment rim calcified right paracentral L4-5 disc extrusion and possible mass effect on the right L5 nerve root, mild spinal stenosis. No acute or other significant osseous findings. No focal pathologic process. IMPRESSION: 1. No acute CT findings in the abdomen or pelvis. 2. Scattered diverticulosis without evidence of diverticulitis. 3. Hepatomegaly with mild steatosis.  No splenomegaly. 4. Mild cardiomegaly. 5. Small umbilical fat hernia. 6. Transitional L5 with bilateral sacral articulations. Adjacent-segment rim calcified right paracentral L4-5 disc extrusion with possible mass effect on the right L5 nerve root, mild spinal stenosis. 7. Chronic appearing nonerosive bilateral sacroiliitis. Electronically Signed   By: Francis Quam M.D.   On: 08/02/2023 22:57    ____________________________________________   PROCEDURES  Procedure(s) performed:   Procedures  None  ____________________________________________   INITIAL IMPRESSION / ASSESSMENT AND PLAN / ED COURSE  Pertinent labs & imaging results that were available during my care of the patient were reviewed by me and considered in my medical decision making (see chart for details).   This patient is Presenting for Evaluation of abdominal pain, which does require a range  of treatment options, and is a complaint that involves a high risk of morbidity and mortality.  The Differential Diagnoses includes but is not exclusive to acute cholecystitis, intrathoracic causes  for epigastric abdominal pain, gastritis, duodenitis, pancreatitis, small bowel or large bowel obstruction, abdominal aortic aneurysm, hernia, gastritis, etc.   Critical Interventions-    Medications  morphine  (PF) 4 MG/ML injection 4 mg (4 mg Intravenous Patient Refused/Not Given 08/02/23 2128)  sodium chloride  0.9 % bolus 1,000 mL (0 mLs Intravenous Stopped 08/02/23 2254)  ondansetron  (ZOFRAN ) injection 4 mg (4 mg Intravenous Given 08/02/23 2128)  iohexol (OMNIPAQUE) 300 MG/ML solution 100 mL (100 mLs Intravenous Contrast Given 08/02/23 2241)  dicyclomine (BENTYL) capsule 10 mg (10 mg Oral Given 08/02/23 2312)  loperamide (IMODIUM) capsule 4 mg (4 mg Oral Given 08/02/23 2312)    Reassessment after intervention: pain improved.   Clinical Laboratory Tests Ordered, included UA without infection.  CBC without leukocytosis.  No acute kidney injury.  LFTs, bilirubin, lipase normal. Troponin negative.   Radiologic Tests Ordered, included CT abdomen/pelvis. I independently interpreted the images and agree with radiology interpretation.   Cardiac Monitor Tracing which shows NSR.    Social Determinants of Health Risk patient is a smoker.   Medical Decision Making: Summary:  The patient presents to the emergency department with abdominal pain and nausea along with diarrhea.  No recent antibiotics or travel.  Plan for labs and CT imaging with mild diffuse tenderness on exam.  Reevaluation with update and discussion with patient.  Symptoms improved here in the ED with treatment.  CT without acute finding.  Labs reassuring.  Doubt atypical ACS with normal troponin and reassuring EKG.  Plan for symptom management, liquid diet, transition as tolerated.  Stable for discharge.  Patient's presentation is most consistent with acute presentation with potential threat to life or bodily function.   Disposition: discharge  ____________________________________________  FINAL CLINICAL IMPRESSION(S) / ED  DIAGNOSES  Final diagnoses:  Generalized abdominal pain  Nausea vomiting and diarrhea     NEW OUTPATIENT MEDICATIONS STARTED DURING THIS VISIT:  Discharge Medication List as of 08/02/2023 11:08 PM     START taking these medications   Details  dicyclomine (BENTYL) 20 MG tablet Take 1 tablet (20 mg total) by mouth 3 (three) times daily as needed., Starting Sun 08/02/2023, Normal    loperamide (IMODIUM) 2 MG capsule Take 1 capsule (2 mg total) by mouth 4 (four) times daily as needed for diarrhea or loose stools., Starting Sun 08/02/2023, Normal    ondansetron  (ZOFRAN -ODT) 4 MG disintegrating tablet Take 1 tablet (4 mg total) by mouth every 8 (eight) hours as needed., Starting Sun 08/02/2023, Normal        Note:  This document was prepared using Dragon voice recognition software and may include unintentional dictation errors.  Fonda Law, MD, [facility]    Hadrian Yarbrough, Fonda MATSU, MD 08/02/23 [id number]

## 2023-08-02 NOTE — ED Notes (Signed)
 Pt ambulated to restroom independently with steady gait.

## 2023-08-02 NOTE — Discharge Instructions (Signed)

## 2023-09-09 DIAGNOSIS — R7301 Impaired fasting glucose: Secondary | ICD-10-CM | POA: Diagnosis not present

## 2023-09-09 DIAGNOSIS — R600 Localized edema: Secondary | ICD-10-CM | POA: Diagnosis not present

## 2023-09-09 DIAGNOSIS — Z6841 Body Mass Index (BMI) 40.0 and over, adult: Secondary | ICD-10-CM | POA: Diagnosis not present

## 2023-09-09 DIAGNOSIS — E611 Iron deficiency: Secondary | ICD-10-CM | POA: Diagnosis not present

## 2023-09-09 DIAGNOSIS — I10 Essential (primary) hypertension: Secondary | ICD-10-CM | POA: Diagnosis not present

## 2023-10-07 DIAGNOSIS — R7301 Impaired fasting glucose: Secondary | ICD-10-CM | POA: Diagnosis not present

## 2023-10-07 DIAGNOSIS — M7661 Achilles tendinitis, right leg: Secondary | ICD-10-CM | POA: Diagnosis not present

## 2023-10-07 DIAGNOSIS — M479 Spondylosis, unspecified: Secondary | ICD-10-CM | POA: Diagnosis not present

## 2023-12-15 ENCOUNTER — Ambulatory Visit: Admitting: Podiatry
